# Patient Record
Sex: Male | Born: 1970 | Race: Black or African American | Hispanic: No | Marital: Single | Smoking: Never smoker
Health system: Southern US, Community
[De-identification: ages and names within clinical notes are randomized; demographics above are authoritative.]

## PROBLEM LIST (undated history)

## (undated) DIAGNOSIS — R0600 Dyspnea, unspecified: Secondary | ICD-10-CM

## (undated) DIAGNOSIS — E119 Type 2 diabetes mellitus without complications: Secondary | ICD-10-CM

## (undated) DIAGNOSIS — E785 Hyperlipidemia, unspecified: Secondary | ICD-10-CM

## (undated) DIAGNOSIS — B958 Unspecified staphylococcus as the cause of diseases classified elsewhere: Secondary | ICD-10-CM

## (undated) DIAGNOSIS — E669 Obesity, unspecified: Secondary | ICD-10-CM

## (undated) DIAGNOSIS — M109 Gout, unspecified: Secondary | ICD-10-CM

## (undated) DIAGNOSIS — I209 Angina pectoris, unspecified: Secondary | ICD-10-CM

## (undated) DIAGNOSIS — D509 Iron deficiency anemia, unspecified: Secondary | ICD-10-CM

## (undated) DIAGNOSIS — I10 Essential (primary) hypertension: Secondary | ICD-10-CM

## (undated) DIAGNOSIS — J189 Pneumonia, unspecified organism: Secondary | ICD-10-CM

## (undated) DIAGNOSIS — K921 Melena: Secondary | ICD-10-CM

## (undated) DIAGNOSIS — R011 Cardiac murmur, unspecified: Secondary | ICD-10-CM

## (undated) HISTORY — DX: Essential (primary) hypertension: I10

## (undated) HISTORY — PX: EYE SURGERY: SHX253

## (undated) HISTORY — DX: Melena: K92.1

## (undated) HISTORY — DX: Dyspnea, unspecified: R06.00

## (undated) HISTORY — DX: Hyperlipidemia, unspecified: E78.5

## (undated) HISTORY — DX: Gout, unspecified: M10.9

## (undated) HISTORY — DX: Iron deficiency anemia, unspecified: D50.9

---
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
## 1998-02-14 ENCOUNTER — Encounter: Payer: Self-pay | Admitting: Emergency Medicine

## 1998-02-14 ENCOUNTER — Emergency Department (HOSPITAL_COMMUNITY): Admission: EM | Admit: 1998-02-14 | Discharge: 1998-02-14 | Payer: Self-pay | Admitting: Emergency Medicine

## 1999-06-02 ENCOUNTER — Encounter: Payer: Self-pay | Admitting: Emergency Medicine

## 1999-06-02 ENCOUNTER — Emergency Department (HOSPITAL_COMMUNITY): Admission: EM | Admit: 1999-06-02 | Discharge: 1999-06-02 | Payer: Self-pay | Admitting: Emergency Medicine

## 2003-02-06 ENCOUNTER — Emergency Department (HOSPITAL_COMMUNITY): Admission: EM | Admit: 2003-02-06 | Discharge: 2003-02-06 | Payer: Self-pay | Admitting: Emergency Medicine

## 2009-02-01 ENCOUNTER — Encounter (INDEPENDENT_AMBULATORY_CARE_PROVIDER_SITE_OTHER): Payer: Self-pay | Admitting: *Deleted

## 2009-02-17 ENCOUNTER — Ambulatory Visit: Payer: Self-pay | Admitting: Family Medicine

## 2009-02-17 DIAGNOSIS — I10 Essential (primary) hypertension: Secondary | ICD-10-CM | POA: Insufficient documentation

## 2009-02-17 DIAGNOSIS — N529 Male erectile dysfunction, unspecified: Secondary | ICD-10-CM | POA: Insufficient documentation

## 2009-02-17 DIAGNOSIS — L0390 Cellulitis, unspecified: Secondary | ICD-10-CM

## 2009-02-17 DIAGNOSIS — L0291 Cutaneous abscess, unspecified: Secondary | ICD-10-CM | POA: Insufficient documentation

## 2009-04-21 ENCOUNTER — Ambulatory Visit: Payer: Self-pay | Admitting: Family Medicine

## 2009-04-24 LAB — CONVERTED CEMR LAB
ALT: 17 units/L (ref 0–53)
AST: 13 units/L (ref 0–37)
Albumin: 3.5 g/dL (ref 3.5–5.2)
Alkaline Phosphatase: 59 units/L (ref 39–117)
BUN: 8 mg/dL (ref 6–23)
Basophils Absolute: 0 10*3/uL (ref 0.0–0.1)
Basophils Relative: 0.3 % (ref 0.0–3.0)
Bilirubin, Direct: 0 mg/dL (ref 0.0–0.3)
CO2: 30 meq/L (ref 19–32)
Calcium: 9 mg/dL (ref 8.4–10.5)
Chloride: 104 meq/L (ref 96–112)
Cholesterol: 194 mg/dL (ref 0–200)
Creatinine, Ser: 1.1 mg/dL (ref 0.4–1.5)
Eosinophils Absolute: 0.1 10*3/uL (ref 0.0–0.7)
Eosinophils Relative: 0.9 % (ref 0.0–5.0)
GFR calc non Af Amer: 95.61 mL/min (ref >60)
Glucose, Bld: 95 mg/dL (ref 70–99)
HCT: 42.5 % (ref 39.0–52.0)
HDL: 38.9 mg/dL — ABNORMAL LOW (ref >39.00)
Hemoglobin: 13.2 g/dL (ref 13.0–17.0)
Hgb A1c MFr Bld: 6.1 % (ref 4.6–6.5)
LDL Cholesterol: 116 mg/dL — ABNORMAL HIGH (ref 0–99)
Lymphocytes Relative: 13.2 % (ref 12.0–46.0)
Lymphs Abs: 1.6 10*3/uL (ref 0.7–4.0)
MCHC: 31.1 g/dL (ref 30.0–36.0)
MCV: 79.5 fL (ref 78.0–100.0)
Monocytes Absolute: 0.5 10*3/uL (ref 0.1–1.0)
Monocytes Relative: 4.1 % (ref 3.0–12.0)
Neutro Abs: 9.6 10*3/uL — ABNORMAL HIGH (ref 1.4–7.7)
Neutrophils Relative %: 81.5 % — ABNORMAL HIGH (ref 43.0–77.0)
PSA: 0.83 ng/mL (ref 0.10–4.00)
Platelets: 278 10*3/uL (ref 150.0–400.0)
Potassium: 3.8 meq/L (ref 3.5–5.1)
RBC: 5.35 M/uL (ref 4.22–5.81)
RDW: 15.2 % — ABNORMAL HIGH (ref 11.5–14.6)
Sex Hormone Binding: 16 nmol/L (ref 13–71)
Sodium: 139 meq/L (ref 135–145)
TSH: 3.05 microintl units/mL (ref 0.35–5.50)
Testosterone Free: 53.5 pg/mL (ref 47.0–244.0)
Testosterone-% Free: 2.7 % (ref 1.6–2.9)
Testosterone: 197.61 ng/dL — ABNORMAL LOW (ref 350–890)
Total Bilirubin: 0.4 mg/dL (ref 0.3–1.2)
Total CHOL/HDL Ratio: 5
Total Protein: 8.2 g/dL (ref 6.0–8.3)
Triglycerides: 197 mg/dL — ABNORMAL HIGH (ref 0.0–149.0)
VLDL: 39.4 mg/dL (ref 0.0–40.0)
WBC: 11.8 10*3/uL — ABNORMAL HIGH (ref 4.5–10.5)

## 2009-05-12 ENCOUNTER — Ambulatory Visit: Payer: Self-pay | Admitting: Family Medicine

## 2010-04-05 NOTE — Assessment & Plan Note (Signed)
Summary: 3 week fu/ see labs,v70,0,401.9,cbcd,bmp,hep,lipid,tsh,psa,fr...   Vital Signs:  Patient profile:   40 year old male Weight:      337 pounds Temp:     98.3 degrees F oral Pulse rate:   86 / minute Pulse rhythm:   regular BP sitting:   130 / 86  (left arm) Cuff size:   large  Vitals Entered By: Army Fossa CMA (April 21, 2009 9:06 AM) CC: follow up on BP, labwork.    History of Present Illness:  Hypertension follow-up      This is a 40 year old man who presents for Hypertension follow-up.  Pt went 3 weeks without bp meds---he just got busy--- just filled it 2 days ago.  The patient denies lightheadedness, urinary frequency, headaches, edema, impotence, rash, and fatigue.  The patient denies the following associated symptoms: chest pain, chest pressure, exercise intolerance, dyspnea, palpitations, syncope, leg edema, and pedal edema.  Compliance with medications (by patient report) has been sporadic.  The patient reports that dietary compliance has been fair.  The patient reports exercising occasionally.  Adjunctive measures currently used by the patient include salt restriction.    Preventive Screening-Counseling & Management  Alcohol-Tobacco     Alcohol drinks/day: <1     Alcohol type: beer     >5/day in last 3 mos: no     Smoking Status: never  Caffeine-Diet-Exercise     Caffeine use/day: 2     Diet Comments: poor     Does Patient Exercise: no  Current Medications (verified): 1)  Lisinopril 10 Mg Tabs (Lisinopril) .Marland Kitchen.. 1 By Mouth Once Daily  Allergies (verified): No Known Drug Allergies  Past History:  Past medical, surgical, family and social histories (including risk factors) reviewed for relevance to current acute and chronic problems.  Past Medical History: Reviewed history from 02/17/2009 and no changes required. Hypertension Previous Heart Murmur  Past Surgical History: Reviewed history from 02/17/2009 and no changes required. liposuction  1992  Family History: Reviewed history from 02/17/2009 and no changes required. Family History Diabetes 1st degree relative Family History Hypertension Heart Disease  Social History: Reviewed history from 02/17/2009 and no changes required. Married Never Smoked Alcohol use-yes Drug use-no Occupation: truck Hospital doctor  Review of Systems      See HPI  Physical Exam  General:  Well-developed,well-nourished,in no acute distress; alert,appropriate and cooperative throughout examinationoverweight-appearing.   Lungs:  Normal respiratory effort, chest expands symmetrically. Lungs are clear to auscultation, no crackles or wheezes. Heart:  Normal rate and regular rhythm. S1 and S2 normal without gallop, murmur, click, rub or other extra sounds. Extremities:  No clubbing, cyanosis, edema, or deformity noted with normal full range of motion of all joints.   Psych:  Oriented X3 and flat affect.     Impression & Recommendations:  Problem # 1:  HYPERTENSION (ICD-401.9) D/W pt importance of taking medication--he understands His updated medication list for this problem includes:    Lisinopril 10 Mg Tabs (Lisinopril) .Marland Kitchen... 1 by mouth once daily  Orders: Venipuncture (09323) TLB-Lipid Panel (80061-LIPID) TLB-BMP (Basic Metabolic Panel-BMET) (80048-METABOL) TLB-CBC Platelet - w/Differential (85025-CBCD) TLB-Hepatic/Liver Function Pnl (80076-HEPATIC) TLB-TSH (Thyroid Stimulating Hormone) (84443-TSH) TLB-A1C / Hgb A1C (Glycohemoglobin) (83036-A1C)  BP today: 130/86 Prior BP: 140/90 (02/17/2009)  Problem # 2:  ERECTILE DYSFUNCTION, ORGANIC (ICD-607.84)  The following medications were removed from the medication list:    Viagra 100 Mg Tabs (Sildenafil citrate) .Marland Kitchen... As directed  Orders: TLB-PSA (Prostate Specific Antigen) (84153-PSA) T- * Misc. Laboratory test (  16109)  Discussed proper use of medications, as well as side effects.   Complete Medication List: 1)  Lisinopril 10 Mg Tabs  (Lisinopril) .Marland Kitchen.. 1 by mouth once daily  Patient Instructions: 1)  Please schedule a follow-up appointment in 2 weeks.

## 2010-04-05 NOTE — Assessment & Plan Note (Signed)
Summary: 3 week fu/kdc   Vital Signs:  Patient profile:   40 year old male Weight:      335 pounds Temp:     97.9 degrees F oral Pulse rate:   82 / minute BP sitting:   130 / 86  (left arm)  Vitals Entered By: Jeremy Johann CMA (May 12, 2009 9:47 AM) CC: 3 weeks f/u  BP Comments REVIEWED MED LIST, PATIENT AGREED DOSE AND INSTRUCTION CORRECT    History of Present Illness:  Hypertension follow-up      This is a 40 year old man who presents for Hypertension follow-up.  Pt missed 2 doses of bp meds in the last 3 weeks.  The patient denies lightheadedness, urinary frequency, headaches, edema, impotence, rash, and fatigue.  The patient denies the following associated symptoms: chest pain, chest pressure, exercise intolerance, dyspnea, palpitations, syncope, leg edema, and pedal edema.  Compliance with medications (by patient report) has been near 100%.  The patient reports that dietary compliance has been good.  Adjunctive measures currently used by the patient include salt restriction.    Allergies (verified): No Known Drug Allergies  Physical Exam  General:  Well-developed,well-nourished,in no acute distress; alert,appropriate and cooperative throughout examination Lungs:  Normal respiratory effort, chest expands symmetrically. Lungs are clear to auscultation, no crackles or wheezes. Heart:  normal rate and no murmur.   Extremities:  No clubbing, cyanosis, edema, or deformity noted with normal full range of motion of all joints.     Impression & Recommendations:  Problem # 1:  HYPERTENSION (ICD-401.9)  His updated medication list for this problem includes:    Lisinopril 20 Mg Tabs (Lisinopril) .Marland Kitchen... 1 by mouth once daily  BP today: 130/86 Prior BP: 130/86 (04/21/2009)  Labs Reviewed: K+: 3.8 (04/21/2009) Creat: : 1.1 (04/21/2009)   Chol: 194 (04/21/2009)   HDL: 38.90 (04/21/2009)   LDL: 116 (04/21/2009)   TG: 197.0 (04/21/2009)  Complete Medication List: 1)  Lisinopril  20 Mg Tabs (Lisinopril) .Marland Kitchen.. 1 by mouth once daily  Patient Instructions: 1)  rto 3-4 weeks Prescriptions: LISINOPRIL 20 MG TABS (LISINOPRIL) 1 by mouth once daily  #30 x 2   Entered and Authorized by:   Loreen Freud DO   Signed by:   Loreen Freud DO on 05/12/2009   Method used:   Electronically to        [facility]  [address] [id number] (ret[facility])       [address]       [place], [place]  [address]       Ph: [phone] or [phone]       Fax: [phone]   RxID:   [id number]

## 2010-09-07 ENCOUNTER — Ambulatory Visit (INDEPENDENT_AMBULATORY_CARE_PROVIDER_SITE_OTHER): Payer: 59 | Admitting: Internal Medicine

## 2010-09-07 ENCOUNTER — Encounter: Payer: Self-pay | Admitting: Internal Medicine

## 2010-09-07 VITALS — BP 142/98 | HR 107 | Temp 97.9°F | Wt 356.0 lb

## 2010-09-07 DIAGNOSIS — K219 Gastro-esophageal reflux disease without esophagitis: Secondary | ICD-10-CM

## 2010-09-07 DIAGNOSIS — I10 Essential (primary) hypertension: Secondary | ICD-10-CM

## 2010-09-07 MED ORDER — LOSARTAN POTASSIUM 100 MG PO TABS: 100.0000 mg | ORAL_TABLET | Freq: Every day | ORAL | Status: DC

## 2010-09-07 MED ORDER — RANITIDINE HCL 150 MG PO TABS: 150.0000 mg | ORAL_TABLET | Freq: Two times a day (BID) | ORAL | Status: DC

## 2010-09-07 NOTE — Patient Instructions (Addendum)
The triggers for reflux  include stress; the "aspirin family" ; alcohol; peppermint; and caffeine (coffee, tea, cola, and chocolate). The aspirin family would include aspirin and the nonsteroidal agents such as ibuprofen &  Naproxen. Tylenol would not cause reflux. If having dyspepsia ; food & dink should be avoided for @ least 2 hours before going to bed.  Stop the lisinopril and monitor the cough on Losartan 100mg  daily. Blood Pressure Goal  Ideally is an AVERAGE < 135/85. This AVERAGE should be calculated from @ least 5-7 BP readings taken @ different times of day on different days of week. You should not respond to isolated BP readings , but rather the AVERAGE for that week . Preventive Health Care: Exercise at least 30-45 minutes a day,  3-4 days a week.  Eat a low-fat diet with lots of fruits and vegetables, up to 7-9 servings per day. Avoid obesity; your goal is waist measurement < 40 inches.Consume less than 40 grams of sugar per day from foods & drinks with High Fructose Corn Sugar as #1,2,3 or # 4 on label. To calculate teaspoons of sugar in foods or drinks divided the grams of sugar by 4. For example a 22 ounce Sprite  will have 68 g of sugar. This would be 17 teaspoons of sugar that the body can not break down. High Fructose corn syrup sugar is sent to the liver and turned a triglycerides or liquid fat which is stored inside the belly. A good reference for low glycemic carbs (non-white carbs urgencies would be the new sugar busters program.

## 2010-09-07 NOTE — Progress Notes (Signed)
  Subjective:    Patient ID: Parker West, male    DOB: [birth year], 40 y.o.   MRN: [id number]  HPI Cough Onset:3 months Extrinsic symptoms:itchy eyes, sneezing:no  Infectious symptoms :fever, purulent secretions :no Chest symptoms: pain, sputum production, hemoptysis,dyspnea,wheezing:no GI symptoms: Dyspepsia, reflux: yes Occupational/environmental exposures: truck driver  Smoking:never ACE inhibitor:Lisinopril 20 mg daily Treatment/efficacy: no treatment to date Past medical history/family history pulmonary disease: none    Review of Systems intermittent snoring but no apnea as per wife     Objective:   Physical Exam General appearance: no acute distress or increased work of breathing is present.  No  lymphadenopathy about the head, neck, or axilla noted.   Eyes: No conjunctival inflammation or lid edema is present. There is no scleral icterus.  Ears:  External ear exam shows no significant lesions or deformities.  Otoscopic examination reveals clear canals, tympanic membranes are intact bilaterally without bulging, retraction, inflammation or discharge.  Nose:  External nasal examination shows no deformity or inflammation. Nasal mucosa are pink and moist without lesions or exudates. No septal dislocation or dislocation.No obstruction to airflow.   Oral exam: Dental hygiene is good; lips and gums are healthy appearing.There is no oropharyngeal erythema or exudate noted but oropharynx crowded  Neck:  No deformities, thyromegaly, masses, or tenderness noted.   Supple with full range of motion without pain.   Heart:  Normal rate and regular rhythm. S1 and S2 normal without  murmur, click, rub or other extra sounds.  S4  Lungs:Chest clear to auscultation; no wheezes, rhonchi,rales ,or rubs present.No increased work of breathing.    Extremities:  No cyanosis, edema, or clubbing  noted    Skin: Warm & dry w/o jaundice or tenting.          Assessment & Plan:  #1 cough,  chronic (3 months)  #2 ACE inhibitor therapy for hypertension  #3 esophageal reflux   Plan: See orders.

## 2011-05-25 ENCOUNTER — Ambulatory Visit (INDEPENDENT_AMBULATORY_CARE_PROVIDER_SITE_OTHER): Payer: 59 | Admitting: Family Medicine

## 2011-05-25 ENCOUNTER — Ambulatory Visit: Payer: 59

## 2011-05-25 VITALS — BP 131/80 | HR 85 | Temp 98.2°F | Resp 16 | Ht 72.0 in | Wt 338.0 lb

## 2011-05-25 DIAGNOSIS — K591 Functional diarrhea: Secondary | ICD-10-CM

## 2011-05-25 DIAGNOSIS — K5289 Other specified noninfective gastroenteritis and colitis: Secondary | ICD-10-CM

## 2011-05-25 DIAGNOSIS — K625 Hemorrhage of anus and rectum: Secondary | ICD-10-CM

## 2011-05-25 DIAGNOSIS — K529 Noninfective gastroenteritis and colitis, unspecified: Secondary | ICD-10-CM

## 2011-05-25 DIAGNOSIS — R197 Diarrhea, unspecified: Secondary | ICD-10-CM

## 2011-05-25 DIAGNOSIS — R109 Unspecified abdominal pain: Secondary | ICD-10-CM

## 2011-05-25 LAB — POCT URINALYSIS DIPSTICK
Bilirubin, UA: NEGATIVE
Blood, UA: NEGATIVE
Glucose, UA: NEGATIVE
Ketones, UA: NEGATIVE
Leukocytes, UA: NEGATIVE
Nitrite, UA: NEGATIVE
Protein, UA: 100
Spec Grav, UA: 1.015
Urobilinogen, UA: 0.2
pH, UA: 7

## 2011-05-25 LAB — POCT CBC
Granulocyte percent: 76.7 %G (ref 37–80)
HCT, POC: 40.3 % — AB (ref 43.5–53.7)
Hemoglobin: 13 g/dL — AB (ref 14.1–18.1)
Lymph, poc: 2.6 (ref 0.6–3.4)
MCH, POC: 23.9 pg — AB (ref 27–31.2)
MCHC: 32.3 g/dL (ref 31.8–35.4)
MCV: 74.3 fL — AB (ref 80–97)
MID (cbc): 0.9 (ref 0–0.9)
MPV: 8.6 fL (ref 0–99.8)
POC Granulocyte: 11.6 — AB (ref 2–6.9)
POC LYMPH PERCENT: 17.3 % (ref 10–50)
POC MID %: 6 %M (ref 0–12)
Platelet Count, POC: 410 10*3/uL (ref 142–424)
RBC: 5.43 M/uL (ref 4.69–6.13)
RDW, POC: 17.6 %
WBC: 15.1 10*3/uL — AB (ref 4.6–10.2)

## 2011-05-25 LAB — POCT UA - MICROSCOPIC ONLY
Casts, Ur, LPF, POC: NEGATIVE
Crystals, Ur, HPF, POC: NEGATIVE
Mucus, UA: NEGATIVE
Yeast, UA: NEGATIVE

## 2011-05-25 LAB — IFOBT (OCCULT BLOOD): IFOBT: NEGATIVE

## 2011-05-25 MED ORDER — CIPROFLOXACIN HCL 500 MG PO TABS: 500.0000 mg | ORAL_TABLET | Freq: Two times a day (BID) | ORAL | Status: AC

## 2011-05-25 NOTE — Progress Notes (Signed)
[facility]:  Office Visit  Chief Complaint:  Chief Complaint  Patient presents with  . Rectal Bleeding    a small amount of bright red blood in stool x 1 week  . Abdominal Pain    right sided discomfort x 1 week    HPI: ULYSEE FYOCK is a 41 y.o. male who complains of   1. 1 week history of right sided flank discomfort. Achy pain when he moves only, not at rest, not associated with food. No fevers, chills, dysuria. Tried Aspirin with some relief. No history of kidney stones.   2. 3 epsiode in last week had blood in stool, bright red bloody streaks in brown stool. Denies constipation or hemorrhoids. No fevers, chills, N/V/abd pain, unintentional weight loss. Denies h/o gastric ulcers, alcohol abuse, IBD/IBS, colon cancer. No prior history. Patient ran out of BP medicine and so took one week of ASA 325 mg daily and then noticed having blood in stool. Last epsiode of BRBPR was this mornig.   3. Diarrhea, nonbloody. Exposurre to GI bug family.   Past Medical History  Diagnosis Date  . Hypertension   . Hyperlipidemia    History reviewed. No pertinent past surgical history. History   Social History  . Marital Status: Single    Spouse Name: N/A    Number of Children: N/A  . Years of Education: N/A   Social History Main Topics  . Smoking status: Never Smoker   . Smokeless tobacco: None  . Alcohol Use: Yes     Social  . Drug Use: No  . Sexually Active: None   Other Topics Concern  . None   Social History Narrative  . None   Family History  Problem Relation Age of Onset  . Stroke Mother   . Hypertension Mother   . Diabetes Mother   . Hypertension Father    No Known Allergies Prior to Admission medications   Medication Sig Start Date End Date Taking? Authorizing Provider  losartan (COZAAR) 100 MG tablet Take 1 tablet (100 mg total) by mouth daily. In place of Lisinopril 09/07/10 09/07/11 Yes Pecola Lawless, MD  ranitidine (ZANTAC) 150 MG tablet  Take 1 tablet (150 mg total) by mouth 2 (two) times daily. 09/07/10 09/07/11  Pecola Lawless, MD     ROS: The patient denies fevers, chills, night sweats, unintentional weight loss, chest pain, palpitations, wheezing, dyspnea on exertion, nausea, vomiting, abdominal pain, dysuria, hematuria, numbness, weakness, or tingling. + BRBPR  All other systems have been reviewed and were otherwise negative with the exception of those mentioned in the HPI and as above.    PHYSICAL EXAM: Filed Vitals:   05/25/11 1358  BP: 131/80  Pulse: 85  Temp: 98.2 F (36.8 C)  Resp: 16   Filed Vitals:   05/25/11 1358  Height: 6' (1.829 m)  Weight: 338 lb (153.316 kg)   Body mass index is 45.84 kg/(m^2).  General: Alert, no acute distress, morbidly obese, morbidly obese HEENT:  Normocephalic, atraumatic, oropharynx patent.  Cardiovascular:  Regular rate and rhythm, no rubs murmurs or gallops.  No Carotid bruits, radial pulse intact. No pedal edema.  Respiratory: Clear to auscultation bilaterally.  No wheezes, rales, or rhonchi.  No cyanosis, no use of accessory musculature GI: No organomegaly, abdomen is soft and non-tender, positive bowel sounds.  No masses. No guarding or rebound Skin: No rashes. Neurologic: Facial musculature symmetric. Psychiatric: Patient is appropriate throughout our interaction. Lymphatic: No cervical lymphadenopathy  Musculoskeletal: Gait intact. Rectal exam-nl, no hemorrhoids, no fissures, normal prostate. hemosure negative  LABS: Results for orders placed in visit on 05/25/11  POCT CBC      Component Value Range   WBC 15.1 (*) 4.6 - 10.2 (K/uL)   Lymph, poc 2.6  0.6 - 3.4    POC LYMPH PERCENT 17.3  10 - 50 (%L)   MID (cbc) 0.9  0 - 0.9    POC MID % 6.0  0 - 12 (%M)   POC Granulocyte 11.6 (*) 2 - 6.9    Granulocyte percent 76.7  37 - 80 (%G)   RBC 5.43  4.69 - 6.13 (M/uL)   Hemoglobin 13.0 (*) 14.1 - 18.1 (g/dL)   HCT, POC 40.9 (*) 81.1 - 53.7 (%)   MCV 74.3 (*) 80 - 97  (fL)   MCH, POC 23.9 (*) 27 - 31.2 (pg)   MCHC 32.3  31.8 - 35.4 (g/dL)   RDW, POC 91.4     Platelet Count, POC 410  142 - 424 (K/uL)   MPV 8.6  0 - 99.8 (fL)  IFOBT (OCCULT BLOOD)      Component Value Range   IFOBT Negative    POCT URINALYSIS DIPSTICK      Component Value Range   Color, UA yellow     Clarity, UA clear     Glucose, UA neg     Bilirubin, UA neg     Ketones, UA neg     Spec Grav, UA 1.015     Blood, UA neg     pH, UA 7.0     Protein, UA 100     Urobilinogen, UA 0.2     Nitrite, UA neg     Leukocytes, UA Negative    POCT UA - MICROSCOPIC ONLY      Component Value Range   WBC, Ur, HPF, POC 0-1     RBC, urine, microscopic 0-1     Bacteria, U Microscopic trace     Mucus, UA neg     Epithelial cells, urine per micros 0-2     Crystals, Ur, HPF, POC neg     Casts, Ur, LPF, POC neg     Yeast, UA neg       EKG/XRAY:   Primary read interpreted by Dr. Conley Rolls at [facility]. No free air, + nonspecific gas patterns   ASSESSMENT/PLAN: Encounter Diagnoses  Name Primary?  . Bright red blood per rectum Yes  . Flank pain   . Diarrhea   . Gastroenteritis    Rx Cipro for ? Gastroenteritis infx x 1 week. WBC was 15. ASked pt to return in 14 days to get repeat CBC and reevalute sxs Push fluids, BRAT diet Gave pt hemoccult stool x 3 to return.  F/u prn otherwise 14 days.     Hamilton Capri PHUONG, DO 05/25/2011 4:42 PM

## 2011-07-14 ENCOUNTER — Ambulatory Visit: Payer: Self-pay | Admitting: Family Medicine

## 2011-07-14 ENCOUNTER — Encounter: Payer: Self-pay | Admitting: Family Medicine

## 2011-07-14 VITALS — BP 154/105 | HR 83 | Temp 98.0°F | Resp 16 | Ht 70.0 in | Wt 331.0 lb

## 2011-07-14 DIAGNOSIS — I10 Essential (primary) hypertension: Secondary | ICD-10-CM

## 2011-07-14 NOTE — Progress Notes (Signed)
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 He has a history of hypertension but is not taking any medicines for the last 2 months.  Objective: Blood pressure recheck shows blood pressure of 120/86, patient is morbidly obese but in no acute distress  HEENT unremarkable  Rest of exam is filled out on the form  I had a long discussion with patient is high blood pressure, and obesity.  Assessment: Patient not eating appropriately and is therefore obese. He needs to make significant changes in his diet such as cutting out sweets sodas, sweet tea, and fast foods. I explained this is poisoned for him. I think he got the message  Plan: About a two-year certificate since patient's blood pressure did come down nicely with relaxation. It took the patient to make changes in his diet so that he does not progress to needing high blood pressure medicines and diabetes.

## 2011-08-27 ENCOUNTER — Other Ambulatory Visit: Payer: Self-pay | Admitting: Internal Medicine

## 2011-08-28 NOTE — Telephone Encounter (Signed)
Pt was last seen in July 2012 and rx was provided for losartan. Pt has no future appts on file with [place]. Please advise if ok to provide refills and if so how many may we give?

## 2011-08-28 NOTE — Telephone Encounter (Signed)
Last seen 7/12.Is he now seeing Dr Janace Hoard @[facility] ? He needs F/U before refill

## 2011-08-29 NOTE — Telephone Encounter (Signed)
Patient needs to schedule a CPX  

## 2011-09-01 ENCOUNTER — Other Ambulatory Visit: Payer: Self-pay | Admitting: Internal Medicine

## 2011-09-02 NOTE — Telephone Encounter (Signed)
Patient needs to schedule a CPX  

## 2011-09-04 ENCOUNTER — Inpatient Hospital Stay (HOSPITAL_COMMUNITY)
Admission: EM | Admit: 2011-09-04 | Discharge: 2011-09-10 | DRG: 546 | Disposition: A | Discharge status: Home / Self Care | Payer: 59 | Attending: Internal Medicine | Admitting: Internal Medicine

## 2011-09-04 ENCOUNTER — Encounter (HOSPITAL_COMMUNITY): Payer: Self-pay | Admitting: *Deleted

## 2011-09-04 DIAGNOSIS — R21 Rash and other nonspecific skin eruption: Secondary | ICD-10-CM

## 2011-09-04 DIAGNOSIS — I10 Essential (primary) hypertension: Secondary | ICD-10-CM | POA: Diagnosis present

## 2011-09-04 DIAGNOSIS — M310 Hypersensitivity angiitis: Principal | ICD-10-CM | POA: Diagnosis present

## 2011-09-04 DIAGNOSIS — Z6841 Body Mass Index (BMI) 40.0 and over, adult: Secondary | ICD-10-CM

## 2011-09-04 DIAGNOSIS — L0291 Cutaneous abscess, unspecified: Secondary | ICD-10-CM

## 2011-09-04 DIAGNOSIS — N529 Male erectile dysfunction, unspecified: Secondary | ICD-10-CM

## 2011-09-04 DIAGNOSIS — L989 Disorder of the skin and subcutaneous tissue, unspecified: Secondary | ICD-10-CM

## 2011-09-04 DIAGNOSIS — L03119 Cellulitis of unspecified part of limb: Secondary | ICD-10-CM | POA: Diagnosis present

## 2011-09-04 DIAGNOSIS — B958 Unspecified staphylococcus as the cause of diseases classified elsewhere: Secondary | ICD-10-CM

## 2011-09-04 DIAGNOSIS — L02419 Cutaneous abscess of limb, unspecified: Secondary | ICD-10-CM | POA: Diagnosis present

## 2011-09-04 DIAGNOSIS — I824Y9 Acute embolism and thrombosis of unspecified deep veins of unspecified proximal lower extremity: Secondary | ICD-10-CM | POA: Diagnosis present

## 2011-09-04 DIAGNOSIS — I82409 Acute embolism and thrombosis of unspecified deep veins of unspecified lower extremity: Secondary | ICD-10-CM

## 2011-09-04 DIAGNOSIS — Z79899 Other long term (current) drug therapy: Secondary | ICD-10-CM

## 2011-09-04 DIAGNOSIS — E785 Hyperlipidemia, unspecified: Secondary | ICD-10-CM | POA: Diagnosis present

## 2011-09-04 DIAGNOSIS — D6859 Other primary thrombophilia: Secondary | ICD-10-CM | POA: Diagnosis present

## 2011-09-04 HISTORY — DX: Cardiac murmur, unspecified: R01.1

## 2011-09-04 HISTORY — DX: Angina pectoris, unspecified: I20.9

## 2011-09-04 HISTORY — DX: Unspecified staphylococcus as the cause of diseases classified elsewhere: B95.8

## 2011-09-04 HISTORY — DX: Obesity, unspecified: E66.9

## 2011-09-04 HISTORY — DX: Pneumonia, unspecified organism: J18.9

## 2011-09-04 LAB — COMPREHENSIVE METABOLIC PANEL
CO2: 28 mEq/L (ref 19–32)
Calcium: 9 mg/dL (ref 8.4–10.5)
Creatinine, Ser: 1.24 mg/dL (ref 0.50–1.35)
GFR calc Af Amer: 82 mL/min — ABNORMAL LOW (ref >90)
GFR calc non Af Amer: 71 mL/min — ABNORMAL LOW (ref >90)
Glucose, Bld: 98 mg/dL (ref 70–99)
Total Protein: 8 g/dL (ref 6.0–8.3)

## 2011-09-04 LAB — CBC WITH DIFFERENTIAL/PLATELET
Basophils Absolute: 0 10*3/uL (ref 0.0–0.1)
Basophils Relative: 0 % (ref 0–1)
Eosinophils Absolute: 0.2 10*3/uL (ref 0.0–0.7)
Hemoglobin: 13.3 g/dL (ref 13.0–17.0)
Lymphocytes Relative: 13 % (ref 12–46)
MCH: 24.4 pg — ABNORMAL LOW (ref 26.0–34.0)
MCHC: 34.1 g/dL (ref 30.0–36.0)
Monocytes Absolute: 1.5 10*3/uL — ABNORMAL HIGH (ref 0.1–1.0)
Neutro Abs: 12.8 10*3/uL — ABNORMAL HIGH (ref 1.7–7.7)
Neutrophils Relative %: 77 % (ref 43–77)
RDW: 16.2 % — ABNORMAL HIGH (ref 11.5–15.5)

## 2011-09-04 NOTE — ED Notes (Signed)
Pt has bilateral lower extremity swelling, pain and redness.  Legs area also warm to the touch and pt has red, raised areas and bumps on both legs.  Pt reports that he has had chills.  These symptoms began 2 days ago and have been increasing

## 2011-09-05 ENCOUNTER — Encounter (HOSPITAL_COMMUNITY): Payer: Self-pay | Admitting: General Practice

## 2011-09-05 DIAGNOSIS — I82409 Acute embolism and thrombosis of unspecified deep veins of unspecified lower extremity: Secondary | ICD-10-CM

## 2011-09-05 DIAGNOSIS — L0291 Cutaneous abscess, unspecified: Secondary | ICD-10-CM

## 2011-09-05 DIAGNOSIS — L989 Disorder of the skin and subcutaneous tissue, unspecified: Secondary | ICD-10-CM | POA: Diagnosis present

## 2011-09-05 DIAGNOSIS — L0390 Cellulitis, unspecified: Secondary | ICD-10-CM

## 2011-09-05 DIAGNOSIS — L03119 Cellulitis of unspecified part of limb: Secondary | ICD-10-CM

## 2011-09-05 DIAGNOSIS — I10 Essential (primary) hypertension: Secondary | ICD-10-CM

## 2011-09-05 LAB — URINALYSIS, ROUTINE W REFLEX MICROSCOPIC
Leukocytes, UA: NEGATIVE
Nitrite: NEGATIVE
Specific Gravity, Urine: 1.016 (ref 1.005–1.030)
Urobilinogen, UA: 1 mg/dL (ref 0.0–1.0)
pH: 6 (ref 5.0–8.0)

## 2011-09-05 LAB — URINE MICROSCOPIC-ADD ON

## 2011-09-05 LAB — PROTIME-INR: Prothrombin Time: 14.6 seconds (ref 11.6–15.2)

## 2011-09-05 LAB — RAPID URINE DRUG SCREEN, HOSP PERFORMED: Amphetamines: NOT DETECTED

## 2011-09-05 LAB — HEPARIN LEVEL (UNFRACTIONATED): Heparin Unfractionated: 0.35 IU/mL (ref 0.30–0.70)

## 2011-09-05 MED ORDER — ENOXAPARIN SODIUM 40 MG/0.4ML ~~LOC~~ SOLN
40.0000 mg | SUBCUTANEOUS | Status: DC
  Administered 2011-09-05: 40 mg via SUBCUTANEOUS
  Filled 2011-09-05: qty 0.4

## 2011-09-05 MED ORDER — OXYCODONE-ACETAMINOPHEN 5-325 MG PO TABS
1.0000 | ORAL_TABLET | Freq: Once | ORAL | Status: AC
  Administered 2011-09-05: 1 via ORAL
  Filled 2011-09-05: qty 1

## 2011-09-05 MED ORDER — SODIUM CHLORIDE 0.9 % IV SOLN: INTRAVENOUS | Status: AC

## 2011-09-05 MED ORDER — WARFARIN VIDEO
Freq: Once | Status: AC
  Administered 2011-09-05: 16:00:00

## 2011-09-05 MED ORDER — LOSARTAN POTASSIUM 50 MG PO TABS
100.0000 mg | ORAL_TABLET | Freq: Every day | ORAL | Status: DC
  Administered 2011-09-05 – 2011-09-10 (×6): 100 mg via ORAL
  Filled 2011-09-05 (×6): qty 2

## 2011-09-05 MED ORDER — COUMADIN BOOK
Freq: Once | Status: AC
  Administered 2011-09-05: 16:00:00
  Filled 2011-09-05: qty 1

## 2011-09-05 MED ORDER — ACETAMINOPHEN 325 MG PO TABS
650.0000 mg | ORAL_TABLET | Freq: Once | ORAL | Status: AC
  Administered 2011-09-05: 650 mg via ORAL
  Filled 2011-09-05: qty 2

## 2011-09-05 MED ORDER — CEFAZOLIN SODIUM 1 G IJ SOLR
1.0000 g | Freq: Once | INTRAMUSCULAR | Status: DC
  Filled 2011-09-05: qty 10

## 2011-09-05 MED ORDER — HYDROXYZINE HCL 25 MG PO TABS
25.0000 mg | ORAL_TABLET | Freq: Once | ORAL | Status: AC
  Administered 2011-09-05: 25 mg via ORAL
  Filled 2011-09-05: qty 1

## 2011-09-05 MED ORDER — VANCOMYCIN HCL IN DEXTROSE 1-5 GM/200ML-% IV SOLN
1000.0000 mg | Freq: Once | INTRAVENOUS | Status: AC
  Administered 2011-09-05: 1000 mg via INTRAVENOUS
  Filled 2011-09-05: qty 200

## 2011-09-05 MED ORDER — SODIUM CHLORIDE 0.9 % IV BOLUS (SEPSIS)
1000.0000 mL | Freq: Once | INTRAVENOUS | Status: AC
  Administered 2011-09-05: 1000 mL via INTRAVENOUS

## 2011-09-05 MED ORDER — HEPARIN BOLUS VIA INFUSION
4000.0000 [IU] | Freq: Once | INTRAVENOUS | Status: AC
  Administered 2011-09-05: 4000 [IU] via INTRAVENOUS
  Filled 2011-09-05: qty 4000

## 2011-09-05 MED ORDER — ACETAMINOPHEN 650 MG RE SUPP: 650.0000 mg | Freq: Four times a day (QID) | RECTAL | Status: DC | PRN

## 2011-09-05 MED ORDER — ONDANSETRON HCL 4 MG PO TABS: 4.0000 mg | ORAL_TABLET | Freq: Four times a day (QID) | ORAL | Status: DC | PRN

## 2011-09-05 MED ORDER — VANCOMYCIN HCL 1000 MG IV SOLR
1500.0000 mg | Freq: Three times a day (TID) | INTRAVENOUS | Status: DC
  Administered 2011-09-05 – 2011-09-06 (×4): 1500 mg via INTRAVENOUS
  Filled 2011-09-05 (×7): qty 1500

## 2011-09-05 MED ORDER — ACETAMINOPHEN 325 MG PO TABS
650.0000 mg | ORAL_TABLET | Freq: Four times a day (QID) | ORAL | Status: DC | PRN
  Administered 2011-09-05: 650 mg via ORAL
  Filled 2011-09-05: qty 2

## 2011-09-05 MED ORDER — HYDROMORPHONE HCL PF 1 MG/ML IJ SOLN
0.5000 mg | INTRAMUSCULAR | Status: DC | PRN
  Administered 2011-09-05 – 2011-09-06 (×4): 0.5 mg via INTRAVENOUS
  Filled 2011-09-05 (×4): qty 1

## 2011-09-05 MED ORDER — ONDANSETRON HCL 4 MG/2ML IJ SOLN: 4.0000 mg | Freq: Four times a day (QID) | INTRAMUSCULAR | Status: DC | PRN

## 2011-09-05 MED ORDER — WARFARIN SODIUM 10 MG PO TABS
10.0000 mg | ORAL_TABLET | Freq: Once | ORAL | Status: AC
  Administered 2011-09-05: 10 mg via ORAL
  Filled 2011-09-05: qty 1

## 2011-09-05 MED ORDER — WARFARIN - PHARMACIST DOSING INPATIENT
Freq: Every day | Status: DC
  Administered 2011-09-06 – 2011-09-08 (×2)

## 2011-09-05 MED ORDER — METHYLPREDNISOLONE SODIUM SUCC 125 MG IJ SOLR
80.0000 mg | Freq: Three times a day (TID) | INTRAMUSCULAR | Status: DC
  Administered 2011-09-05 – 2011-09-08 (×8): 80 mg via INTRAVENOUS
  Filled 2011-09-05: qty 2
  Filled 2011-09-05 (×4): qty 1.28
  Filled 2011-09-05: qty 2
  Filled 2011-09-05: qty 1.28
  Filled 2011-09-05: qty 2
  Filled 2011-09-05 (×2): qty 1.28
  Filled 2011-09-05: qty 2
  Filled 2011-09-05 (×2): qty 1.28
  Filled 2011-09-05: qty 2

## 2011-09-05 MED ORDER — SODIUM CHLORIDE 0.9 % IV SOLN
INTRAVENOUS | Status: DC
  Administered 2011-09-05 (×2): via INTRAVENOUS

## 2011-09-05 MED ORDER — PIPERACILLIN-TAZOBACTAM 3.375 G IVPB
3.3750 g | Freq: Three times a day (TID) | INTRAVENOUS | Status: DC
  Administered 2011-09-05 – 2011-09-07 (×7): 3.375 g via INTRAVENOUS
  Filled 2011-09-05 (×11): qty 50

## 2011-09-05 MED ORDER — SODIUM CHLORIDE 0.9 % IJ SOLN
3.0000 mL | Freq: Two times a day (BID) | INTRAMUSCULAR | Status: DC
  Administered 2011-09-06 – 2011-09-10 (×7): 3 mL via INTRAVENOUS

## 2011-09-05 MED ORDER — HEPARIN (PORCINE) IN NACL 100-0.45 UNIT/ML-% IJ SOLN
1900.0000 [IU]/h | INTRAMUSCULAR | Status: DC
  Administered 2011-09-05 – 2011-09-06 (×2): 1900 [IU]/h via INTRAVENOUS
  Filled 2011-09-05 (×3): qty 250

## 2011-09-05 MED ORDER — DEXTROSE 5 % IV SOLN
1.0000 g | Freq: Once | INTRAVENOUS | Status: AC
  Administered 2011-09-05: 1 g via INTRAVENOUS
  Filled 2011-09-05: qty 10

## 2011-09-05 NOTE — ED Provider Notes (Signed)
Patient seen and examined with Ms Drue Novel.  Patient with diffuse lower extremity rash with macules and papule with some confluency.  Patient with fever and leukocytosis.  Plan iv antibiotics. History/physical exam/procedure(s) were performed by non-physician practitioner and as supervising physician I was immediately available for consultation/collaboration. I have reviewed all notes and am in agreement with care and plan.    Hilario Quarry, MD 09/05/11 [phone]

## 2011-09-05 NOTE — Progress Notes (Signed)
ANTICOAGULATION CONSULT NOTE - Follow Up Consult  Pharmacy Consult for heparin Indication: DVT  Labs:  Basename 09/05/11 2246 09/05/11 1559 09/04/11 2158  HGB -- -- 13.3  HCT -- -- 39.0  PLT -- -- 318  APTT -- -- --  LABPROT -- 14.6 --  INR -- 1.12 --  HEPARINUNFRC 0.35 -- --  CREATININE -- -- 1.24  CKTOTAL -- -- --  CKMB -- -- --  TROPONINI -- -- --    Assessment/Plan: 41yo male therapeutic on heparin with initial dosing for DVT.  Will continue gtt at current rate and confirm stable with am labs.  Colleen Can PharmD BCPS 09/05/2011,11:37 PM

## 2011-09-05 NOTE — Consult Note (Signed)
Reason for Consult:Rash Referring Physician:Oti  Parker West is an 41 y.o. male.  HPI:41 year old Black male with no prior skin disorder presented to ED last night with 3 day history of itchy then painful rash on legs.  He states at the outset, there was a tender lump at the upper right inner thigh, then the rash appeared.  He denies activities and exposures that would lead to contact dermatitis.  He was previously well.  He is on no new meds, but takes his blood pressure medicine, losartan, intermittently.  He restarted about ten days ago.  Denies fevers, chills, pain, headache, photophobia.  Denies over the counter or recreational drugs.  Denies work, hobbies or crafts that might be contributory.  Past Medical History  Diagnosis Date  . Hypertension   . Hyperlipidemia   . Obesity   . Heart murmur     "when I was small"  . Anginal pain   . Pneumonia 1988    "walking"  . Staph infection 09/04/11    BLE/wife's report    Past Surgical History  Procedure Date  . Reduction mammaplasty 1990's    Family History  Problem Relation Age of Onset  . Stroke Mother   . Hypertension Mother   . Diabetes Mother   . Hypertension Father     Social History:  reports that he has never smoked. He has never used smokeless tobacco. He reports that he drinks alcohol. He reports that he does not use illicit drugs.  Allergies: No Known Allergies  Medications: I have reviewed the patient's current medications.  Results for orders placed during the hospital encounter of 09/04/11 (from the past 48 hour(s))  CBC WITH DIFFERENTIAL     Status: Abnormal   Collection Time   09/04/11  9:58 PM      Component Value Range Comment   WBC 16.7 (*) 4.0 - 10.5 K/uL    RBC 5.44  4.22 - 5.81 MIL/uL    Hemoglobin 13.3  13.0 - 17.0 g/dL    HCT 16.1  09.6 - 04.5 %    MCV 71.7 (*) 78.0 - 100.0 fL    MCH 24.4 (*) 26.0 - 34.0 pg    MCHC 34.1  30.0 - 36.0 g/dL    RDW 40.9 (*) 81.1 - 15.5 %    Platelets 318  150 -  400 K/uL    Neutrophils Relative 77  43 - 77 %    Lymphocytes Relative 13  12 - 46 %    Monocytes Relative 9  3 - 12 %    Eosinophils Relative 1  0 - 5 %    Basophils Relative 0  0 - 1 %    Neutro Abs 12.8 (*) 1.7 - 7.7 K/uL    Lymphs Abs 2.2  0.7 - 4.0 K/uL    Monocytes Absolute 1.5 (*) 0.1 - 1.0 K/uL    Eosinophils Absolute 0.2  0.0 - 0.7 K/uL    Basophils Absolute 0.0  0.0 - 0.1 K/uL    RBC Morphology POLYCHROMASIA PRESENT      Smear Review LARGE PLATELETS PRESENT     COMPREHENSIVE METABOLIC PANEL     Status: Abnormal   Collection Time   09/04/11  9:58 PM      Component Value Range Comment   Sodium 142  135 - 145 mEq/L    Potassium 3.8  3.5 - 5.1 mEq/L    Chloride 101  96 - 112 mEq/L    CO2 28  19 - 32 mEq/L    Glucose, Bld 98  70 - 99 mg/dL    BUN 14  6 - 23 mg/dL    Creatinine, Ser 1.24  0.50 - 1.35 mg/dL    Calcium 9.0  8.4 - 10.5 mg/dL    Total Protein 8.0  6.0 - 8.3 g/dL    Albumin 3.1 (*) 3.5 - 5.2 g/dL    AST 14  0 - 37 U/L    ALT 11  0 - 53 U/L    Alkaline Phosphatase 81  39 - 117 U/L    Total Bilirubin 0.3  0.3 - 1.2 mg/dL    GFR calc non Af Amer 71 (*) >90 mL/min    GFR calc Af Amer 82 (*) >90 mL/min   URINALYSIS, ROUTINE W REFLEX MICROSCOPIC     Status: Abnormal   Collection Time   09/05/11 12:54 AM      Component Value Range Comment   Color, Urine YELLOW  YELLOW    APPearance CLEAR  CLEAR    Specific Gravity, Urine 1.016  1.005 - 1.030    pH 6.0  5.0 - 8.0    Glucose, UA NEGATIVE  NEGATIVE mg/dL    Hgb urine dipstick MODERATE (*) NEGATIVE    Bilirubin Urine NEGATIVE  NEGATIVE    Ketones, ur NEGATIVE  NEGATIVE mg/dL    Protein, ur 100 (*) NEGATIVE mg/dL    Urobilinogen, UA 1.0  0.0 - 1.0 mg/dL    Nitrite NEGATIVE  NEGATIVE    Leukocytes, UA NEGATIVE  NEGATIVE   URINE MICROSCOPIC-ADD ON     Status: Abnormal   Collection Time   09/05/11 12:54 AM      Component Value Range Comment   Squamous Epithelial / LPF RARE  RARE    WBC, UA 0-2  <3 WBC/hpf    RBC /  HPF 3-6  <3 RBC/hpf    Bacteria, UA RARE  RARE    Casts HYALINE CASTS (*) NEGATIVE   PROTIME-INR     Status: Normal   Collection Time   09/05/11  3:59 PM      Component Value Range Comment   Prothrombin Time 14.6  11.6 - 15.2 seconds    INR 1.12  0.00 - 1.49   ANTITHROMBIN III     Status: Normal   Collection Time   09/05/11  3:59 PM      Component Value Range Comment   AntiThromb III Func 79  75 - 120 %     No results found.  Review of Systems  Constitutional: Positive for malaise/fatigue. Negative for fever.  Skin: Positive for rash.   Blood pressure 126/71, pulse 126, temperature 98 F (36.7 C), temperature source Oral, resp. rate 20, height 5\' 11"  (1.803 m), weight 162.8 kg (358 lb 14.5 oz), SpO2 99.00%. Physical Exam Bilateral lower extremities are noted to have marked edema.  There are purpuric patches around the margins of the feet above the soles, and multiple discrete and confluent irregular purpuric macules and patches, many of which have tense vesicles centrally that do not extend with pressure (negative Asboe Hansen sign).  At his right upper inner thigh there is a 3cm dermal tender nodule.  There scattered papules on his upper extremities.  The oropharynx and eyes are clear.  Assessment/Plan: Drug reaction versus vasculitis versus erythema multiforme.  Discussed with the requesting physcian, Dr. Blenda Nicely.  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   Biopsies of lesional and perilesional skin  of the right lower extremity were performed for routine histology and direct immuno fluorescence.  I&D of the upper inner thigh mass revealed only bloody discharge, bacterial culture and sensitivity obtained.  Discussed with Dr. Blenda Nicely, who has ordered coagulation studies, ANA, RPR, HIV, among other studies.    The purpuric eruption does not look primarily infectious.  Start IV Solumedrol, and transition to prednisone taper for discharge if he progresses as expected, 80mg  to 0 over 14 days.    Simona Huh 09/05/2011, 5:29 PM

## 2011-09-05 NOTE — Progress Notes (Signed)
ANTICOAGULATION CONSULT NOTE - Initial Consult  Pharmacy Consult for Heparin and Coumadin Indication: Bilateral DVTs  No Known Allergies  Patient Measurements: Height: 5\' 11"  (180.3 cm) Weight: 358 lb 14.5 oz (162.8 kg) IBW/kg (Calculated) : 75.3  Heparin Dosing Weight: 114.7kg  Vital Signs: Temp: 98.4 F (36.9 C) (07/04 0348) Temp src: Oral (07/04 0348) BP: 142/86 mmHg (07/04 0930) Pulse Rate: 112  (07/04 0930)  Labs:  [name] 09/04/11 2158  HGB 13.3  HCT 39.0  PLT 318  APTT --  LABPROT --  INR --  HEPARINUNFRC --  CREATININE 1.24  CKTOTAL --  CKMB --  TROPONINI --    Estimated Creatinine Clearance: 122.3 ml/min (by C-G formula based on Cr of 1.24).   Medical History: Past Medical History  Diagnosis Date  . Hypertension   . Hyperlipidemia   . Obesity   . Heart murmur     "when I was small"  . Anginal pain   . Pneumonia 1988    "walking"  . Staph infection 09/04/11    BLE/wife's report    Medications:  Prescriptions prior to admission  Medication Sig Dispense Refill  . Aspirin-Acetaminophen-Caffeine (GOODY HEADACHE PO) Take 1 packet by mouth daily as needed. For pain      . losartan (COZAAR) 100 MG tablet Take 100 mg by mouth daily.      . naproxen sodium (ANAPROX) 220 MG tablet Take 220 mg by mouth daily as needed. For pain      . OVER THE COUNTER MEDICATION Take 2 tablets by mouth every 8 (eight) hours as needed. For pain. (OTC pain reliever)        Assessment: 40yom to start Heparin and Coumadin (Day 1 of minimum 5 Day overlap) for bilateral DVTs. Patient received Lovenox 40mg  this AM ~0950 - will not have a major influence but will not order full heparin bolus. Patient is not taking any anticoagulants. - Baseline INR ordered - H/H and Plts wnl - AST/ALT wnl - No significant bleeding reported - Heparin weight: 114.7kg - Warfarin points: 11  Goal of Therapy:  INR 2-3 Heparin level 0.3-0.7 units/ml Monitor platelets by anticoagulation  protocol: Yes   Plan:  1. Heparin IV bolus 4000 units x 1 2. Heparin drip 1900 units/hr (19 ml/hr) 3. Check heparin level 6 hours after heparin initiation 4. Coumadin 10mg  po x 1 today (confirm baseline INR prior to administration) 5. Daily INR, heparin level and CBC 6. Coumadin educational book/video  Cleon Dew [phone] 09/05/2011,3:34 PM

## 2011-09-05 NOTE — Progress Notes (Addendum)
  Comment:  Patient has just completed vascular doppler of both LE, which confirmed bilateral LE DVTs. 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 He has no discernible family history of VTE at a young age.   Plan: Have sent off hypercoagulable panel, and ordered anticoagulation with iv Heparin and concomitant Coumadin.   Note: Dr Terri Piedra, dermatologist has seen patient today, examined the eruption, and feels that a vasculitis has to be excluded. He did a punch biopsy, and has recommended iv Solumedrol for now, transitioned to oral prednisone taper at discharge, starting with 80 mg, then tapering to zero, over 16 days. Also he did take a swab from right upper thigh wound which was sent for culture.  UDS, ANA, HIV test, RPR and hep C and B antibody screen, sent off.

## 2011-09-05 NOTE — Progress Notes (Signed)
Subjective: No new issues.   Objective: Vital signs in last 24 hours: Temp:  [98.4 F (36.9 C)-100.8 F (38.2 C)] 98.4 F (36.9 C) (07/04 0348) Pulse Rate:  [108-124] 117  (07/04 0348) Resp:  [18-20] 18  (07/04 0348) BP: (116-146)/(58-100) 134/88 mmHg (07/04 0348) SpO2:  [96 %-100 %] 100 % (07/04 0348) Weight:  [162.8 kg (358 lb 14.5 oz)] 162.8 kg (358 lb 14.5 oz) (07/04 0348) Weight change:  Last BM Date: 09/04/11  Intake/Output from previous day: 07/03 0701 - 07/04 0700 In: 196.7 [P.O.:110; I.V.:86.7] Out: 400 [Urine:400]     Physical Exam: General: Comfortable, alert, communicative, fully oriented, not short of breath at rest.  HEENT:  No clinical pallor, no jaundice, no conjunctival injection or discharge. NECK:  Supple, JVP not seen, no carotid bruits, no palpable lymphadenopathy, no palpable goiter. CHEST:  Clinically clear to auscultation, no wheezes, no crackles. HEART:  Sounds 1 and 2 heard, normal, regular, no murmurs. ABDOMEN:  Obese, soft, non-tender, no palpable organomegaly, no palpable masses, normal bowel sounds. GENITALIA:  Not examined. LOWER EXTREMITIES:  No pitting edema, palpable peripheral pulses. MUSCULOSKELETAL SYSTEM:  Generalized osteoarthritic changes, otherwise, normal. CENTRAL NERVOUS SYSTEM:  No focal neurologic deficit on gross examination. SKIN: Patient has a raised erythematous rash involving bilateral LE, from dorsum of feet to both thighs, with vesiculation and pustulation. Also involvement of both UE, although far more sparse in distribution. There is sparing of trunk and face. The lesions are painful, although he had earlier had some itching.   Lab Results:  [facility] 09/04/11 2158  WBC 16.7*  HGB 13.3  HCT 39.0  PLT 318    Basename 09/04/11 2158  NA 142  K 3.8  CL 101  CO2 28  GLUCOSE 98  BUN 14  CREATININE 1.24  CALCIUM 9.0   No results found for this or any previous visit (from the past 240 hour(s)).    Studies/Results: No results found.  Medications: Scheduled Meds:   . sodium chloride   Intravenous STAT  . acetaminophen  650 mg Oral Once  . cefTRIAXone (ROCEPHIN)  IV  1 g Intravenous Once  . enoxaparin  40 mg Subcutaneous Q24H  . hydrOXYzine  25 mg Oral Once  . losartan  100 mg Oral Daily  . oxyCODONE-acetaminophen  1 tablet Oral Once  . piperacillin-tazobactam (ZOSYN)  IV  3.375 g Intravenous Q8H  . sodium chloride  1,000 mL Intravenous Once  . sodium chloride  3 mL Intravenous Q12H  . vancomycin  1,500 mg Intravenous Q8H  . vancomycin  1,000 mg Intravenous Once  . DISCONTD: ceFAZolin (ANCEF) IM  1 g Intramuscular Once   Continuous Infusions:   . sodium chloride 50 mL/hr at 09/05/11 0416   PRN Meds:.acetaminophen, acetaminophen, HYDROmorphone (DILAUDID) injection, ondansetron (ZOFRAN) IV, ondansetron  Assessment/Plan:  Active Problems: 1. Dermatosis: Patient presented with an erythematous eruption, involving upper and lower extremities, complicated by vesiculation and pustulation. He denies any new medication or possible contact with contact allergens, such a poison ivy. Per spouse, when the rash initially appeared on 09/03/11, he utilized topical Hydrocortisone, without improvement. He has been commenced on iv Vanc/Zosyn, now day#1, on suspicion of cellulitis. Though I doubt cellulitis, this combination will be adequate for secondary bacterial infection, which may have been introduced by excoriation, particularly, as patient did have a low grade pyrexia, is mildly tachycardic, and has a significant leukocytosis. Steroids may be necessary. Patient will certainly benefit from dermatology input. Will place a consultation request.  2. HTN (hypertension): Controlled on ARB.    LOS: 1 day   Darric Plante,CHRISTOPHER 09/05/2011, 8:50 AM

## 2011-09-05 NOTE — H&P (Signed)
Parker West is an 41 y.o. male.   PCP - Dr.Lowne,Yvonne. Chief Complaint: Swelling and erythema lower extremities. HPI: 41 year-old male show hypertension and morbid obesity has been experiencing swelling pain and erythema of the lower extremity with some boils in the lower extremity with some discharge. This has been going on for last 2 days and progressing. There were some balls formed in the left forearm also. Patient denies any fever chills. Has pain in both lower extremity. In the ER patient was found to have leukocytosis and was mildly febrile. At this time patient is being admitted for cellulitis of the lower extremity. His pain is controlled pain relief medications. Patient states he has been having some boils the right groin area for some time.   Past Medical History  Diagnosis Date  . Hypertension   . Hyperlipidemia   . Obesity   . Heart murmur     "when I was small"  . Anginal pain   . Pneumonia 1988    "walking"  . Staph infection 09/04/11    BLE/wife's report    Past Surgical History  Procedure Date  . Reduction mammaplasty 1990's    Family History  Problem Relation Age of Onset  . Stroke Mother   . Hypertension Mother   . Diabetes Mother   . Hypertension Father    Social History:  reports that he has never smoked. He has never used smokeless tobacco. He reports that he drinks alcohol. He reports that he does not use illicit drugs.  Allergies: No Known Allergies  Medications Prior to Admission  Medication Sig Dispense Refill  . Aspirin-Acetaminophen-Caffeine (GOODY HEADACHE PO) Take 1 packet by mouth daily as needed. For pain      . losartan (COZAAR) 100 MG tablet Take 100 mg by mouth daily.      . naproxen sodium (ANAPROX) 220 MG tablet Take 220 mg by mouth daily as needed. For pain      . OVER THE COUNTER MEDICATION Take 2 tablets by mouth every 8 (eight) hours as needed. For pain. (OTC pain reliever)        Results for orders placed during the hospital  encounter of 09/04/11 (from the past 48 hour(s))  CBC WITH DIFFERENTIAL     Status: Abnormal   Collection Time   09/04/11  9:58 PM      Component Value Range Comment   WBC 16.7 (*) 4.0 - 10.5 K/uL    RBC 5.44  4.22 - 5.81 MIL/uL    Hemoglobin 13.3  13.0 - 17.0 g/dL    HCT 62.1  30.8 - 65.7 %    MCV 71.7 (*) 78.0 - 100.0 fL    MCH 24.4 (*) 26.0 - 34.0 pg    MCHC 34.1  30.0 - 36.0 g/dL    RDW 84.6 (*) 96.2 - 15.5 %    Platelets 318  150 - 400 K/uL    Neutrophils Relative 77  43 - 77 %    Lymphocytes Relative 13  12 - 46 %    Monocytes Relative 9  3 - 12 %    Eosinophils Relative 1  0 - 5 %    Basophils Relative 0  0 - 1 %    Neutro Abs 12.8 (*) 1.7 - 7.7 K/uL    Lymphs Abs 2.2  0.7 - 4.0 K/uL    Monocytes Absolute 1.5 (*) 0.1 - 1.0 K/uL    Eosinophils Absolute 0.2  0.0 - 0.7 K/uL  Basophils Absolute 0.0  0.0 - 0.1 K/uL    RBC Morphology POLYCHROMASIA PRESENT      Smear Review LARGE PLATELETS PRESENT     COMPREHENSIVE METABOLIC PANEL     Status: Abnormal   Collection Time   09/04/11  9:58 PM      Component Value Range Comment   Sodium 142  135 - 145 mEq/L    Potassium 3.8  3.5 - 5.1 mEq/L    Chloride 101  96 - 112 mEq/L    CO2 28  19 - 32 mEq/L    Glucose, Bld 98  70 - 99 mg/dL    BUN 14  6 - 23 mg/dL    Creatinine, Ser 1.61  0.50 - 1.35 mg/dL    Calcium 9.0  8.4 - 09.6 mg/dL    Total Protein 8.0  6.0 - 8.3 g/dL    Albumin 3.1 (*) 3.5 - 5.2 g/dL    AST 14  0 - 37 U/L    ALT 11  0 - 53 U/L    Alkaline Phosphatase 81  39 - 117 U/L    Total Bilirubin 0.3  0.3 - 1.2 mg/dL    GFR calc non Af Amer 71 (*) >90 mL/min    GFR calc Af Amer 82 (*) >90 mL/min   URINALYSIS, ROUTINE W REFLEX MICROSCOPIC     Status: Abnormal   Collection Time   09/05/11 12:54 AM      Component Value Range Comment   Color, Urine YELLOW  YELLOW    APPearance CLEAR  CLEAR    Specific Gravity, Urine 1.016  1.005 - 1.030    pH 6.0  5.0 - 8.0    Glucose, UA NEGATIVE  NEGATIVE mg/dL    Hgb urine dipstick  MODERATE (*) NEGATIVE    Bilirubin Urine NEGATIVE  NEGATIVE    Ketones, ur NEGATIVE  NEGATIVE mg/dL    Protein, ur 045 (*) NEGATIVE mg/dL    Urobilinogen, UA 1.0  0.0 - 1.0 mg/dL    Nitrite NEGATIVE  NEGATIVE    Leukocytes, UA NEGATIVE  NEGATIVE   URINE MICROSCOPIC-ADD ON     Status: Abnormal   Collection Time   09/05/11 12:54 AM      Component Value Range Comment   Squamous Epithelial / LPF RARE  RARE    WBC, UA 0-2  <3 WBC/hpf    RBC / HPF 3-6  <3 RBC/hpf    Bacteria, UA RARE  RARE    Casts HYALINE CASTS (*) NEGATIVE    No results found.  Review of Systems  Constitutional: Negative.   HENT: Negative.   Eyes: Negative.   Respiratory: Negative.   Cardiovascular: Negative.   Gastrointestinal: Negative.   Genitourinary: Negative.   Musculoskeletal:       Pain and swelling of the lower extremities.  Skin: Positive for rash.  Neurological: Negative.   Endo/Heme/Allergies: Negative.   Psychiatric/Behavioral: Negative.     Blood pressure 134/58, pulse 108, temperature 98.5 F (36.9 C), temperature source Oral, resp. rate 20, SpO2 96.00%. Physical Exam  Constitutional: He is oriented to person, place, and time. He appears well-developed and well-nourished. No distress.  HENT:  Head: Normocephalic and atraumatic.  Right Ear: External ear normal.  Left Ear: External ear normal.  Nose: Nose normal.  Mouth/Throat: Oropharynx is clear and moist. No oropharyngeal exudate.  Eyes: Conjunctivae are normal. Pupils are equal, round, and reactive to light. Right eye exhibits no discharge. Left eye exhibits no discharge. No  scleral icterus.  Neck: Normal range of motion. Neck supple.  Cardiovascular: Normal rate and regular rhythm.   Respiratory: Effort normal and breath sounds normal. No respiratory distress. He has no wheezes. He has no rales.  GI: Soft. Bowel sounds are normal. He exhibits no distension. There is no tenderness. There is no rebound.  Musculoskeletal:       Lower  extremity swelling and erythema with blisters knee downwards both sides. 3 plus edema. Some blisters in the left forearm.  Neurological: He is alert and oriented to person, place, and time.       Moves all extremities.  Skin: Skin is warm. He is not diaphoretic.  Psychiatric: His behavior is normal.     Assessment/Plan #1. Cellulitis of the both lower extremities - given patient's leukocytosis and mild fever and erythematous swelling of the lower extremity the most likely causes cellulitis of both lower extremities. I have placed patient on vancomycin and Zosyn. Obtain blood cultures. Keep legs elevated. Patient's leg is swollen and has to be watched for any compartment syndrome formation. Check Dopplers of lower extremity to rule out DVT. #2. History of hypertension - continue ARB.   CODE STATUS - full code.   Kimmberly Wisser N. 09/05/2011, 3:49 AM

## 2011-09-05 NOTE — Progress Notes (Addendum)
*  Preliminary Results* Bilateral lower extremity venous duplex completed. Bilateral lower extremities are positive for deep vein thrombosis involving the right popliteal vein, left saphenofemoral junction and left common femoral vein.  09/05/2011 2:56 PM Gertie Fey, RDMS, RDCS

## 2011-09-05 NOTE — ED Provider Notes (Signed)
History     CSN: [id number]  Arrival date & time 09/04/11  2112   First MD Initiated Contact with Patient 09/04/11 2247      Chief Complaint  Patient presents with  . Cellulitis  . Rash    (Consider location/radiation/quality/duration/timing/severity/associated sxs/prior treatment) HPI Comments: Patient is a morbidly obese African American male with a history of hyperlipidemia and hypertension that presents to the emergency department with the chief complaint of rash.  Onset of symptoms began about 2 days ago and started on his left shin.  Rash has since spread upwards and is now on both legs bilaterally, pruritic in nature, extremely painful, erythematous, located on lower extremities from feet to upper thighs and arms but sparing trunk neck and face.  Associated symptoms are chills, fever, leg swelling, and severe pain.  Patient denies any dysphasia, difficulty breathing, shortness of breath, chest pain, IV drug use, history of diabetes, MRSA history, recent insect bite, nausea, vomiting, abdominal pain, change in bowel movements, neck pain/stiffness, or headache.  No other complaints at this time.  The history is provided by the patient.    Past Medical History  Diagnosis Date  . Hypertension   . Hyperlipidemia   . Obesity     History reviewed. No pertinent past surgical history.  Family History  Problem Relation Age of Onset  . Stroke Mother   . Hypertension Mother   . Diabetes Mother   . Hypertension Father     History  Substance Use Topics  . Smoking status: Never Smoker   . Smokeless tobacco: Not on file  . Alcohol Use: Yes     Social      Review of Systems  Skin: Positive for color change and rash.  All other systems reviewed and are negative.    Allergies  Review of patient's allergies indicates no known allergies.  Home Medications   Current Outpatient Rx  Name Route Sig Dispense Refill  . GOODY HEADACHE PO Oral Take 1 packet by mouth daily as  needed. For pain    . LOSARTAN POTASSIUM 100 MG PO TABS Oral Take 100 mg by mouth daily.    Marland Kitchen NAPROXEN SODIUM 220 MG PO TABS Oral Take 220 mg by mouth daily as needed. For pain    . OVER THE COUNTER MEDICATION Oral Take 2 tablets by mouth every 8 (eight) hours as needed. For pain. (OTC pain reliever)      BP 146/100  Pulse 124  Temp 100.8 F (38.2 C) (Oral)  Resp 20  SpO2 97%  Physical Exam  Nursing note and vitals reviewed. Constitutional: He is oriented to person, place, and time. He appears well-developed and well-nourished. No distress.  HENT:  Head: Normocephalic and atraumatic.       Oropharynx clear and moist.  Eyes: Conjunctivae and EOM are normal.  Neck: Normal range of motion.       Flexion and extension of neck without difficulty or pain.  Cardiovascular:       Tachycardic, no aberrancy and auscultation.  Pulmonary/Chest: Effort normal.       Lungs clear to auscultation bilaterally, no wheezing or stridor.  Abdominal:       Abdomen soft, obese, nontender.  Musculoskeletal: Normal range of motion.  Neurological: He is alert and oriented to person, place, and time.  Skin: Skin is warm and dry. Rash noted. He is not diaphoretic.       Rash located on lower legs bilaterally from thighs to toes.  Multiple large  round erythematous base with central pustular or nodular lesion.  2 areas of weeping located on anterior shins bilaterally.  Skin warm and tender to palpation.    Psychiatric: He has a normal mood and affect. His behavior is normal.    ED Course  Procedures (including critical care time)  Labs Reviewed  CBC WITH DIFFERENTIAL - Abnormal; Notable for the following:    WBC 16.7 (*)     MCV 71.7 (*)     MCH 24.4 (*)     RDW 16.2 (*)     Neutro Abs 12.8 (*)     Monocytes Absolute 1.5 (*)     All other components within normal limits  COMPREHENSIVE METABOLIC PANEL - Abnormal; Notable for the following:    Albumin 3.1 (*)     GFR calc non Af Amer 71 (*)     GFR  calc Af Amer 82 (*)     All other components within normal limits  URINALYSIS, ROUTINE W REFLEX MICROSCOPIC   No results found.   No diagnosis found.    MDM  Rash c/w staph type skin infection  Obese patient presents emergency department with rash, onset 2 days ago, temperature of 100.8, tachycardic 124 and hypertensive.  He has been treated with fluids, pain medication, and antibiotics (vancomycin and Rocephin) in the emergency department.  [facility] to admit.        Jaci Carrel, [place] 09/05/11 [phone]

## 2011-09-05 NOTE — Progress Notes (Signed)
ANTIBIOTIC CONSULT NOTE - INITIAL  Pharmacy Consult for Vancomycin and Zosyn Indication: cellulitis  No Known Allergies  Patient Measurements: Height: 5\' 11"  (180.3 cm) Weight: 358 lb 14.5 oz (162.8 kg) IBW/kg (Calculated) : 75.3  Adjusted Body Weight: 110  Vital Signs: Temp: 98.4 F (36.9 C) (07/04 0348) Temp src: Oral (07/04 0348) BP: 134/88 mmHg (07/04 0348) Pulse Rate: 117  (07/04 0348)  Labs:  Parker West 09/04/11 2158  WBC 16.7*  HGB 13.3  PLT 318  LABCREA --  CREATININE 1.24   Estimated Creatinine Clearance: 122.3 ml/min (by C-G formula based on Cr of 1.24). No results found for this basename: VANCOTROUGH:2,VANCOPEAK:2,VANCORANDOM:2,GENTTROUGH:2,GENTPEAK:2,GENTRANDOM:2,TOBRATROUGH:2,TOBRAPEAK:2,TOBRARND:2,AMIKACINPEAK:2,AMIKACINTROU:2,AMIKACIN:2, in the last 72 hours   Microbiology: No results found for this or any previous visit (from the past 720 hour(s)).  Medical History: Past Medical History  Diagnosis Date  . Hypertension   . Hyperlipidemia   . Obesity   . Heart murmur     "when I was small"  . Anginal pain   . Pneumonia 1988    "walking"  . Staph infection 09/04/11    BLE/wife's report    Medications:  Prescriptions prior to admission  Medication Sig Dispense Refill  . Aspirin-Acetaminophen-Caffeine (GOODY HEADACHE PO) Take 1 packet by mouth daily as needed. For pain      . losartan (COZAAR) 100 MG tablet Take 100 mg by mouth daily.      . naproxen sodium (ANAPROX) 220 MG tablet Take 220 mg by mouth daily as needed. For pain      . OVER THE COUNTER MEDICATION Take 2 tablets by mouth every 8 (eight) hours as needed. For pain. (OTC pain reliever)       Assessment: 41 yo male with cellulitis for empiric antibiotics.  Vancomycin 1 g IV given in ED at 0100.  Goal of Therapy:  Vancomycin trough level 10-15 mcg/ml  Plan:  Vancomycin 1500 mg IV q8h Zosyn 3.375 g IV q8h   Parker West 09/05/2011,4:15 AM

## 2011-09-06 DIAGNOSIS — L0291 Cutaneous abscess, unspecified: Secondary | ICD-10-CM

## 2011-09-06 DIAGNOSIS — R21 Rash and other nonspecific skin eruption: Secondary | ICD-10-CM

## 2011-09-06 DIAGNOSIS — I82409 Acute embolism and thrombosis of unspecified deep veins of unspecified lower extremity: Secondary | ICD-10-CM

## 2011-09-06 LAB — COMPREHENSIVE METABOLIC PANEL
ALT: 12 U/L (ref 0–53)
Alkaline Phosphatase: 70 U/L (ref 39–117)
BUN: 16 mg/dL (ref 6–23)
CO2: 22 mEq/L (ref 19–32)
GFR calc Af Amer: 89 mL/min — ABNORMAL LOW (ref >90)
GFR calc non Af Amer: 77 mL/min — ABNORMAL LOW (ref >90)
Glucose, Bld: 160 mg/dL — ABNORMAL HIGH (ref 70–99)
Potassium: 3.9 mEq/L (ref 3.5–5.1)
Sodium: 138 mEq/L (ref 135–145)
Total Bilirubin: 0.2 mg/dL — ABNORMAL LOW (ref 0.3–1.2)

## 2011-09-06 LAB — LUPUS ANTICOAGULANT PANEL
DRVVT: 43.7 secs (ref <45.1)
PTT Lupus Anticoagulant: 43.4 secs — ABNORMAL HIGH (ref 28.0–43.0)
PTTLA 4:1 Mix: 45.4 secs — ABNORMAL HIGH (ref 28.0–43.0)
PTTLA Confirmation: 16.7 secs — ABNORMAL HIGH (ref <8.0)

## 2011-09-06 LAB — RPR: RPR Ser Ql: NONREACTIVE

## 2011-09-06 LAB — HEPATITIS B SURFACE ANTIBODY,QUALITATIVE: Hep B S Ab: POSITIVE — AB

## 2011-09-06 LAB — CBC
HCT: 34.8 % — ABNORMAL LOW (ref 39.0–52.0)
Hemoglobin: 12.1 g/dL — ABNORMAL LOW (ref 13.0–17.0)
MCH: 25.1 pg — ABNORMAL LOW (ref 26.0–34.0)
RBC: 4.83 MIL/uL (ref 4.22–5.81)

## 2011-09-06 LAB — HIV ANTIBODY (ROUTINE TESTING W REFLEX): HIV: NONREACTIVE

## 2011-09-06 LAB — PROTEIN C ACTIVITY: Protein C Activity: 161 % — ABNORMAL HIGH (ref 75–133)

## 2011-09-06 LAB — ANA: Anti Nuclear Antibody(ANA): NEGATIVE

## 2011-09-06 MED ORDER — ENOXAPARIN SODIUM 150 MG/ML ~~LOC~~ SOLN
160.0000 mg | Freq: Two times a day (BID) | SUBCUTANEOUS | Status: DC
  Filled 2011-09-06 (×2): qty 2

## 2011-09-06 MED ORDER — SODIUM CHLORIDE 0.9 % IJ SOLN
10.0000 mL | Freq: Two times a day (BID) | INTRAMUSCULAR | Status: DC
  Administered 2011-09-07 – 2011-09-09 (×4): 10 mL via INTRAVENOUS

## 2011-09-06 MED ORDER — ENOXAPARIN SODIUM 150 MG/ML ~~LOC~~ SOLN
160.0000 mg | Freq: Two times a day (BID) | SUBCUTANEOUS | Status: DC
  Administered 2011-09-06 – 2011-09-10 (×9): 160 mg via SUBCUTANEOUS
  Filled 2011-09-06 (×17): qty 2

## 2011-09-06 MED ORDER — AMLODIPINE BESYLATE 5 MG PO TABS
5.0000 mg | ORAL_TABLET | Freq: Every day | ORAL | Status: DC
  Administered 2011-09-06: 5 mg via ORAL
  Filled 2011-09-06 (×2): qty 1

## 2011-09-06 MED ORDER — WARFARIN SODIUM 10 MG PO TABS
10.0000 mg | ORAL_TABLET | Freq: Once | ORAL | Status: AC
  Administered 2011-09-06: 10 mg via ORAL
  Filled 2011-09-06: qty 1

## 2011-09-06 MED ORDER — VANCOMYCIN HCL 1000 MG IV SOLR
1500.0000 mg | Freq: Two times a day (BID) | INTRAVENOUS | Status: DC
  Administered 2011-09-06 – 2011-09-07 (×2): 1500 mg via INTRAVENOUS
  Filled 2011-09-06 (×3): qty 1500

## 2011-09-06 NOTE — Progress Notes (Signed)
INITIAL ADULT NUTRITION ASSESSMENT Date: 09/06/2011   Time: 11:45 AM Reason for Assessment: Consult  ASSESSMENT: Male 41 y.o.  Dx: Cellulitis of multiple sites of lower extremity  Hx:  Past Medical History  Diagnosis Date  . Hypertension   . Hyperlipidemia   . Obesity   . Heart murmur     "when I was small"  . Anginal pain   . Pneumonia 1988    "walking"  . Staph infection 09/04/11    BLE/wife's report    Related Meds:     . sodium chloride   Intravenous STAT  . amLODipine  5 mg Oral Daily  . coumadin book   Does not apply Once  . enoxaparin (LOVENOX) injection  160 mg Subcutaneous Q12H  . heparin  4,000 Units Intravenous Once  . losartan  100 mg Oral Daily  . methylPREDNISolone (SOLU-MEDROL) injection  80 mg Intravenous Q8H  . piperacillin-tazobactam (ZOSYN)  IV  3.375 g Intravenous Q8H  . sodium chloride  10 mL Intravenous Q12H  . sodium chloride  3 mL Intravenous Q12H  . vancomycin  1,500 mg Intravenous Q8H  . warfarin  10 mg Oral Once  . warfarin   Does not apply Once  . Warfarin - Pharmacist Dosing Inpatient   Does not apply q1800  . DISCONTD: enoxaparin (LOVENOX) injection  160 mg Subcutaneous Q12H  . DISCONTD: enoxaparin  40 mg Subcutaneous Q24H     Ht: 5\' 11"  (180.3 cm)  Wt: 359 lb 1.6 oz (162.887 kg)  Ideal Wt: 78.2 kg % Ideal Wt: 209%  Usual Wt:  Wt Readings from Last 10 Encounters:  09/06/11 359 lb 1.6 oz (162.887 kg)  07/14/11 331 lb (150.141 kg)  05/25/11 338 lb (153.316 kg)  09/07/10 356 lb (161.481 kg)  05/12/09 335 lb (151.955 kg)  04/21/09 337 lb (152.862 kg)  02/17/09 347 lb (157.398 kg)    % Usual Wt: >100%  Body mass index is 50.08 kg/(m^2). Pt is morbidly obese.   Food/Nutrition Related Hx: No nutrition problems indicated per nutrition risk report completed on admission   Labs:  CMP     Component Value Date/Time   NA 138 09/06/2011 0700   K 3.9 09/06/2011 0700   CL 103 09/06/2011 0700   CO2 22 09/06/2011 0700   GLUCOSE 160*  09/06/2011 0700   BUN 16 09/06/2011 0700   CREATININE 1.16 09/06/2011 0700   CALCIUM 8.6 09/06/2011 0700   PROT 7.5 09/06/2011 0700   ALBUMIN 2.9* 09/06/2011 0700   AST 12 09/06/2011 0700   ALT 12 09/06/2011 0700   ALKPHOS 70 09/06/2011 0700   BILITOT 0.2* 09/06/2011 0700   GFRNONAA 77* 09/06/2011 0700   GFRAA 89* 09/06/2011 0700   Lab Results  Component Value Date   HGBA1C 6.1 04/21/2009   Lipid Panel     Component Value Date/Time   CHOL 194 04/21/2009 0933   TRIG 197.0* 04/21/2009 0933   HDL 38.90* 04/21/2009 0933   CHOLHDL 5 04/21/2009 0933   VLDL 39.4 04/21/2009 0933   LDLCALC 116* 04/21/2009 0933      Intake/Output Summary (Last 24 hours) at 09/06/11 1148 Last data filed at 09/06/11 0830  Gross per 24 hour  Intake 6064.62 ml  Output      0 ml  Net 6064.62 ml     Diet Order: General  Supplements/Tube Feeding: none  IVF:    DISCONTD: sodium chloride Last Rate: 100 mL/hr at 09/05/11 2138  DISCONTD: heparin Last Rate: 1,900 Units/hr (09/06/11  1478)    Estimated Nutritional Needs:   Kcal:  1900-2100 to promote weight loss  Protein: 90-100 gm  Fluid:  > 2 L   Pt admitted with multiple areas of rash on BLE.  Pt currently with elevated BS, likely r/t steroids, last A1c was done in 2011 and was WNL. If CBG's continue to be above normal limits, recommend rechecking A1c.   Pt requesting diet education for weight loss with himself and his wife present. RD set up meeting for later this afternoon to provide education.   NUTRITION DIAGNOSIS: -Food and nutrition related knowledge deficit (NB-1.1).  Status: Ongoing  RELATED TO: limited previous diet education   AS EVIDENCE BY: morbid obesity  MONITORING/EVALUATION(Goals): Goal: Pt will follow provided recommendations for weight loss to achieve 1-2 lb weight loss per week post d/c.   Monitor: follow up education needs  EDUCATION NEEDS: -Education needs addressed -Meeting set with pt and wife for this afternoon for diet  education  INTERVENTION: 1. Diet education 2. RD will follow while inpatient and recommend followed by out pt RD if pt willing   DOCUMENTATION CODES Per approved criteria  -Morbid Obesity    Clarene Duke RD, LDN Pager [phone] After Hours pager [phone]

## 2011-09-06 NOTE — Progress Notes (Addendum)
Subjective: Feels better.  Objective: Vital signs in last 24 hours: Temp:  [98 F (36.7 C)-99.2 F (37.3 C)] 98.3 F (36.8 C) (07/05 0551) Pulse Rate:  [103-126] 103  (07/05 0551) Resp:  [20-22] 22  (07/05 0551) BP: (126-162)/(71-102) 161/102 mmHg (07/05 0551) SpO2:  [95 %-99 %] 99 % (07/05 0551) Weight:  [162.887 kg (359 lb 1.6 oz)] 162.887 kg (359 lb 1.6 oz) (07/05 0500) Weight change: 0.087 kg (3.1 oz) Last BM Date: 09/04/11  Intake/Output from previous day: 07/04 0701 - 07/05 0700 In: 3532.1 [P.O.:200; I.V.:232.1; IV Piggyback:3100] Out: -      Physical Exam: General: Comfortable, alert, communicative, fully oriented, not short of breath at rest.  HEENT:  No clinical pallor, no jaundice, no conjunctival injection or discharge. NECK:  Supple, JVP not seen, no carotid bruits, no palpable lymphadenopathy, no palpable goiter. CHEST:  Clinically clear to auscultation, no wheezes, no crackles. HEART:  Sounds 1 and 2 heard, normal, regular, no murmurs. ABDOMEN:  Obese, soft, non-tender, no palpable organomegaly, no palpable masses, normal bowel sounds. GENITALIA:  Not examined. LOWER EXTREMITIES:  No pitting edema, palpable peripheral pulses. MUSCULOSKELETAL SYSTEM:  Generalized osteoarthritic changes, otherwise, normal. CENTRAL NERVOUS SYSTEM:  No focal neurologic deficit on gross examination. SKIN: Patient has a raised erythematous rash involving bilateral LE, from dorsum of feet to both thighs, with vesiculation and pustulation. Also involvement of both UE, although far more sparse in distribution. There is sparing of trunk and face. The lesions are painful, although he had earlier had some itching. Today, eruption appears appreciably better.   Lab Results:  Basename 09/06/11 0700 09/04/11 2158  WBC 18.7* 16.7*  HGB 12.1* 13.3  HCT 34.8* 39.0  PLT 279 318    Basename 09/04/11 2158  NA 142  K 3.8  CL 101  CO2 28  GLUCOSE 98  BUN 14  CREATININE 1.24  CALCIUM 9.0    Recent Results (from the past 240 hour(s))  CULTURE, BLOOD (ROUTINE X 2)     Status: Normal (Preliminary result)   Collection Time   09/05/11  2:40 AM      Component Value Range Status Comment   Specimen Description BLOOD RIGHT ARM   Final    Special Requests BOTTLES DRAWN AEROBIC AND ANAEROBIC 3CC EACH   Final    Culture  Setup Time 09/05/2011 16:55   Final    Culture     Final    Value:        BLOOD CULTURE RECEIVED NO GROWTH TO DATE CULTURE WILL BE HELD FOR 5 DAYS BEFORE ISSUING A FINAL NEGATIVE REPORT   Report Status PENDING   Incomplete   CULTURE, BLOOD (ROUTINE X 2)     Status: Normal (Preliminary result)   Collection Time   09/05/11  2:45 AM      Component Value Range Status Comment   Specimen Description BLOOD RIGHT HAND   Final    Special Requests BOTTLES DRAWN AEROBIC ONLY 2CC   Final    Culture  Setup Time 09/05/2011 16:55   Final    Culture     Final    Value:        BLOOD CULTURE RECEIVED NO GROWTH TO DATE CULTURE WILL BE HELD FOR 5 DAYS BEFORE ISSUING A FINAL NEGATIVE REPORT   Report Status PENDING   Incomplete   CULTURE, ROUTINE-ABSCESS     Status: Normal (Preliminary result)   Collection Time   09/05/11  4:12 PM      Component  Value Range Status Comment   Specimen Description ABSCESS GROIN   Final    Special Requests NONE   Final    Gram Stain PENDING   Incomplete    Culture NO GROWTH 1 DAY   Final    Report Status PENDING   Incomplete      Studies/Results: No results found.  Medications: Scheduled Meds:    . sodium chloride   Intravenous STAT  . coumadin book   Does not apply Once  . heparin  4,000 Units Intravenous Once  . losartan  100 mg Oral Daily  . methylPREDNISolone (SOLU-MEDROL) injection  80 mg Intravenous Q8H  . piperacillin-tazobactam (ZOSYN)  IV  3.375 g Intravenous Q8H  . sodium chloride  3 mL Intravenous Q12H  . vancomycin  1,500 mg Intravenous Q8H  . warfarin  10 mg Oral Once  . warfarin   Does not apply Once  . Warfarin - Pharmacist  Dosing Inpatient   Does not apply q1800  . DISCONTD: enoxaparin  40 mg Subcutaneous Q24H   Continuous Infusions:    . sodium chloride 100 mL/hr at 09/05/11 2138  . heparin 1,900 Units/hr (09/06/11 0343)   PRN Meds:.acetaminophen, acetaminophen, HYDROmorphone (DILAUDID) injection, ondansetron (ZOFRAN) IV, ondansetron  Assessment/Plan:  Active Problems: 1. Dermatosis: Patient presented with an erythematous eruption, involving upper and lower extremities, complicated by vesiculation and pustulation. He denies any new medication or possible contact with contact allergens, such as poison ivy. Per spouse, when the rash initially appeared on 09/03/11, he utilized topical Hydrocortisone, without improvement. He was commenced on iv Vanc/Zosyn, now day#2, on suspicion of cellulitis. This combination will be adequate for secondary bacterial infection, which may have been introduced by excoriation, particularly, as patient did have a low grade pyrexia, was mildly tachycardic, and had a significant leukocytosis. Dr Druscilla Brownie, kindly provided dermatologic consultation, and has opined that this clinical picture is consistent with drug reaction versus vasculitis versus erythema multiforme. He took biopsies of lesional and perilesional skin of the right lower extremity for routine histology and direct immuno fluorescence, and has recommended systemic steroid treatment. HIV, RPR testing was non-reactive, hepatitis serology is negative for Hepatitis B and C.  2. HTN (hypertension): Sub-optimally controlled today. Currently on ARB. We shall adjust medications as indicated. 3. Right thigh abscess: Patient had a small abscess of right upper, inner thigh. I&D was performed by Dr Allyson Sabal, and revealed only bloody discharge. Bacterial culture and sensitivity obtained. this will be adequately covered by above antibiotics.  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 He has no discernible family history of VTE at a young age. We sent off hypercoagulable panel, commenced anticoagulation. Patient has no chest pain, dyspnea or hypoxia.  Comment: We shall discontinue telemetry, iv fluids, today.   LOS: 2 days   Monte Bronder,CHRISTOPHER 09/06/2011, 8:25 AM   Addendum: Have discussed pathology findings with Dr Allyson Sabal today. 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 No change in treatment recommended.

## 2011-09-06 NOTE — Progress Notes (Signed)
ANTICOAGULATION& ANTIBIOTIC CONSULT NOTE - Follow Up Consult  Pharmacy Consult for Lovenox, Coumadin, Vancomycin, Zosyn Indication: DVT, cellulitis/groin abscess  No Known Allergies  Patient Measurements: Height: 5\' 11"  (180.3 cm) Weight: 359 lb 1.6 oz (162.887 kg) IBW/kg (Calculated) : 75.3   Vital Signs: Temp: 98.3 F (36.8 C) (07/05 0551) Temp src: Oral (07/05 0551) BP: 161/102 mmHg (07/05 0551) Pulse Rate: 103  (07/05 0551)  Labs:  Basename 09/06/11 0700 09/05/11 2246 09/05/11 1559 09/04/11 2158  HGB 12.1* -- -- 13.3  HCT 34.8* -- -- 39.0  PLT 279 -- -- 318  APTT -- -- -- --  LABPROT 14.7 -- 14.6 --  INR 1.13 -- 1.12 --  HEPARINUNFRC 0.30 0.35 -- --  CREATININE 1.16 -- -- 1.24  CKTOTAL -- -- -- --  CKMB -- -- -- --  TROPONINI -- -- -- --    Estimated Creatinine Clearance: 130.7 ml/min (by C-G formula based on Cr of 1.16).   Assessment: 41 yo M presented with erythematous eruption, involving upper and lower extremities, complicated by vesiculation and pustulation 7/4.  Dermatology consulted and thinks consistent with drug reaction versus vasculitis versus erythema multiforme.  Biopsies of lesional and perilesional skin of the right LE were taken 7/4.  On empiric vancomycin and Zosyn day #2.  Renal function improving with estimated creatinine clearance >100 ml/min.  Blood cultures and groin abscess culture with NGTD.  Vancomycin trough above goal at 20.  Will decrease to q12hr dosing.  Patient also found to have bilateral LE DVT's, on day #2 of 5 day minimum overlap to Coumadin.  Heparin being transitioned to Lovenox today.  Heparin level therapeutic this AM.  INR remains sub-therapeutic which is expected.  No overt bleeding reported, H/H/plt slightly decreased.  Goal of Therapy:  INR 2-3 Monitor platelets by anticoagulation protocol: Yes Anti-Xa level 0.6-1.2 units/ml Vancomycin trough 10-15 mcg/ml   Plan:  1.  Discontinue heparin and begin Lovenox 160 mg SQ q12hr  (1 mg/kg q12hr) one hour after heparin discontinued (spoke with RN and aware) 2.  Change CBC to q72hr 3.  Repeat Coumadin 10 mg po x 1 tonight 4.  Continue daily PT/INR 5.  Continue Zosyn 3.375 gm IV q8hr, each dose over 4 hours 6.  Decrease Vancomycin to 1500 mg IV q12hr (spoke with RN, and has not hung 1400 dose, will start 12 hrs after last dose which is at 1600) 7.  F/up cultures, renal function, further vancomycin troughs as indicated   Edwyna Shell, Pharm.D., BCPS Clinical Pharmacist Pager: [phone] 09/06/2011,1:05 PM

## 2011-09-06 NOTE — Plan of Care (Signed)
Problem: Food- and Nutrition-Related Knowledge Deficit (NB-1.1) Goal: Nutrition education Formal process to instruct or train a patient/client in a skill or to impart knowledge to help patients/clients voluntarily manage or modify food choices and eating behavior to maintain or improve health.  Outcome: Completed/Met Date Met:  09/06/11 RD was consulted for education with pt. Pt states he is ready to make changes and lose weight. RD met with the pt and his wife, as well as several other family/friends in room. Pt went over 24 recall. 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   RD helped pt come up with breakfast and lunch ideas that would be less calories/fat and able to be eaten in his truck. RD encouraged smaller meals and high fiber snacks between to help with over eating. Reports sometimes he goes 6-8 hours between meals and then eats a very large meal. Pt also eats large meals at sit down restaurants often. RD helped pt find lower calorie options at these places as well.  RD encouraged pt to eliminate calorie containing beverages such as sweet tea and sodas. Recommended water, unsweet tea, or diet beverages.   RD provided pt with "weight loss tips" from the academy of [employer] dietetics as well and "nutrition in the fast lane" calorie guide for restaurants. Pt seemed willing to make changes at first, but the more we talked the less he wanted to change. Seemed reluctant to even try to change soda to diet or other calorie free choice, despite the fact that his wife is diabetic and keeps many of those types of beverages in the house. Many of the foods I recommended he stated "I don't eat that!" such as raw vegetables, wheat bread, or boiled eggs. Pt does have a very supportive wife who is knowledgeable in a diabetic diet, and I encouraged her to help him with his diet changes.   Pt was also questioning exercise. RD encouraged  pt to be cleared by his MD for activity prior to starting any activity, but provided general guidelines for 30 minutes moderate activity 3-5 days per week.   RD expects fair compliance. Likely needs continued reinforcement and would benefit from outpatient RD visits. RD provided pt with contact information in additional questions arise. No further nutrition needs at this time.   Body mass index is 50.08 kg/(m^2). Pt is morbidly obese.

## 2011-09-06 NOTE — Progress Notes (Signed)
Utilization Review Completed.Dorcas Carrow T7/07/2011

## 2011-09-07 LAB — COMPREHENSIVE METABOLIC PANEL
BUN: 17 mg/dL (ref 6–23)
Calcium: 8.5 mg/dL (ref 8.4–10.5)
GFR calc Af Amer: 80 mL/min — ABNORMAL LOW (ref >90)
Glucose, Bld: 208 mg/dL — ABNORMAL HIGH (ref 70–99)
Total Protein: 7.5 g/dL (ref 6.0–8.3)

## 2011-09-07 LAB — CBC
HCT: 34.7 % — ABNORMAL LOW (ref 39.0–52.0)
Hemoglobin: 11.8 g/dL — ABNORMAL LOW (ref 13.0–17.0)
MCH: 24.4 pg — ABNORMAL LOW (ref 26.0–34.0)
MCHC: 34 g/dL (ref 30.0–36.0)

## 2011-09-07 LAB — PROTIME-INR: Prothrombin Time: 15 seconds (ref 11.6–15.2)

## 2011-09-07 MED ORDER — WARFARIN SODIUM 7.5 MG PO TABS
15.0000 mg | ORAL_TABLET | Freq: Once | ORAL | Status: AC
  Administered 2011-09-07: 15 mg via ORAL
  Filled 2011-09-07: qty 2

## 2011-09-07 MED ORDER — AMLODIPINE BESYLATE 10 MG PO TABS
10.0000 mg | ORAL_TABLET | Freq: Every day | ORAL | Status: DC
  Administered 2011-09-07 – 2011-09-10 (×4): 10 mg via ORAL
  Filled 2011-09-07 (×4): qty 1

## 2011-09-07 MED ORDER — DOXYCYCLINE HYCLATE 100 MG PO TABS
100.0000 mg | ORAL_TABLET | Freq: Two times a day (BID) | ORAL | Status: DC
  Administered 2011-09-07 – 2011-09-10 (×7): 100 mg via ORAL
  Filled 2011-09-07 (×8): qty 1

## 2011-09-07 NOTE — Progress Notes (Signed)
ANTICOAGULATION CONSULT NOTE - Follow Up Consult  Pharmacy Consult for Lovenox and Coumadin  Indication: DVT  No Known Allergies  Patient Measurements: Height: 5\' 11"  (180.3 cm) Weight: 359 lb 1.7 oz (162.89 kg) IBW/kg (Calculated) : 75.3   Vital Signs: Temp: 97.7 F (36.5 C) (07/06 0439) Temp src: Oral (07/06 0439) BP: 146/78 mmHg (07/06 0439) Pulse Rate: 98  (07/06 0439)  Labs:  Basename 09/07/11 0635 09/06/11 0700 09/05/11 2246 09/05/11 1559 09/04/11 2158  HGB 11.8* 12.1* -- -- --  HCT 34.7* 34.8* -- -- 39.0  PLT 314 279 -- -- 318  APTT -- -- -- -- --  LABPROT 15.0 14.7 -- 14.6 --  INR 1.16 1.13 -- 1.12 --  HEPARINUNFRC -- 0.30 0.35 -- --  CREATININE 1.26 1.16 -- -- 1.24  CKTOTAL -- -- -- -- --  CKMB -- -- -- -- --  TROPONINI -- -- -- -- --    Estimated Creatinine Clearance: 120.4 ml/min (by C-G formula based on Cr of 1.26).  Assessment: 41 yo M found to have B/L DVTs on day 3 of 5 day minimum overlap of Lovenox to Coumadin. Renal function and CBC are stable. No bleeding is noted.  INR is below goal and unchanged despite 2 doses of Coumadin. No interactions noted.  Goal of Therapy:  INR 2-3 Anti-Xa level 0.6-1.2 units/ml 4hrs after LMWH dose given Monitor platelets by anticoagulation protocol: Yes   Plan:  1. Increase Coumadin to 15 mg po tonight 2. Continue Lovenox 160 mg sq q12h 3. INR daily, CBC q72h  [facility], [address].D., BCPS Clinical Pharmacist Pager: [phone] 09/07/2011 8:00 AM

## 2011-09-07 NOTE — Progress Notes (Addendum)
Subjective: Continues to improve.  Objective: Vital signs in last 24 hours: Temp:  [97.4 F (36.3 C)-98.3 F (36.8 C)] 97.7 F (36.5 C) (07/06 0439) Pulse Rate:  [71-123] 98  (07/06 0439) Resp:  [18-22] 18  (07/06 0439) BP: (136-164)/(78-111) 146/78 mmHg (07/06 0439) SpO2:  [95 %-98 %] 98 % (07/06 0439) Weight:  [162.89 kg (359 lb 1.7 oz)] 162.89 kg (359 lb 1.7 oz) (07/06 0500) Weight change: 0.003 kg (0.1 oz) Last BM Date: 09/06/11  Intake/Output from previous day: 07/05 0701 - 07/06 0700 In: 2987.5 [P.O.:455; I.V.:2532.5] Out: -      Physical Exam: General: Comfortable, alert, communicative, fully oriented, not short of breath at rest.  HEENT:  No clinical pallor, no jaundice, no conjunctival injection or discharge. NECK:  Supple, JVP not seen, no carotid bruits, no palpable lymphadenopathy, no palpable goiter. CHEST:  Clinically clear to auscultation, no wheezes, no crackles. HEART:  Sounds 1 and 2 heard, normal, regular, no murmurs. ABDOMEN:  Obese, soft, non-tender, no palpable organomegaly, no palpable masses, normal bowel sounds. GENITALIA:  Not examined. LOWER EXTREMITIES:  No pitting edema, palpable peripheral pulses. Rash (See below). Drained abscess of right inner thigh has resolved, and is without induration. Only minimal sanguinous exudate..  MUSCULOSKELETAL SYSTEM:  Unremarkable. CENTRAL NERVOUS SYSTEM:  No focal neurologic deficit on gross examination. SKIN: Patient had a raised erythematous rash involving bilateral LE, from dorsum of feet to both thighs, with vesiculation and pustulation. Also involvement of both UE, although far more sparse in distribution. There is sparing of trunk and face. Today, the eruptions have dramatically improved, practically resolved on upper extremities, no further pustulation on lower extremities.   Lab Results:  Basename 09/07/11 0635 09/06/11 0700  WBC 29.0* 18.7*  HGB 11.8* 12.1*  HCT 34.7* 34.8*  PLT 314 279    Basename  09/07/11 0635 09/06/11 0700  NA 138 138  K 4.0 3.9  CL 106 103  CO2 22 22  GLUCOSE 208* 160*  BUN 17 16  CREATININE 1.26 1.16  CALCIUM 8.5 8.6   Recent Results (from the past 240 hour(s))  CULTURE, BLOOD (ROUTINE X 2)     Status: Normal (Preliminary result)   Collection Time   09/05/11  2:40 AM      Component Value Range Status Comment   Specimen Description BLOOD RIGHT ARM   Final    Special Requests BOTTLES DRAWN AEROBIC AND ANAEROBIC 3CC EACH   Final    Culture  Setup Time 09/05/2011 16:55   Final    Culture     Final    Value:        BLOOD CULTURE RECEIVED NO GROWTH TO DATE CULTURE WILL BE HELD FOR 5 DAYS BEFORE ISSUING A FINAL NEGATIVE REPORT   Report Status PENDING   Incomplete   CULTURE, BLOOD (ROUTINE X 2)     Status: Normal (Preliminary result)   Collection Time   09/05/11  2:45 AM      Component Value Range Status Comment   Specimen Description BLOOD RIGHT HAND   Final    Special Requests BOTTLES DRAWN AEROBIC ONLY 2CC   Final    Culture  Setup Time 09/05/2011 16:55   Final    Culture     Final    Value:        BLOOD CULTURE RECEIVED NO GROWTH TO DATE CULTURE WILL BE HELD FOR 5 DAYS BEFORE ISSUING A FINAL NEGATIVE REPORT   Report Status PENDING   Incomplete  CULTURE, ROUTINE-ABSCESS     Status: Normal (Preliminary result)   Collection Time   09/05/11  4:12 PM      Component Value Range Status Comment   Specimen Description ABSCESS GROIN   Final    Special Requests NONE   Final    Gram Stain     Final    Value: RARE WBC PRESENT,BOTH PMN AND MONONUCLEAR     NO SQUAMOUS EPITHELIAL CELLS SEEN     NO ORGANISMS SEEN   Culture NO GROWTH 2 DAYS   Final    Report Status PENDING   Incomplete      Studies/Results: No results found.  Medications: Scheduled Meds:    . amLODipine  5 mg Oral Daily  . enoxaparin (LOVENOX) injection  160 mg Subcutaneous Q12H  . losartan  100 mg Oral Daily  . methylPREDNISolone (SOLU-MEDROL) injection  80 mg Intravenous Q8H  .  piperacillin-tazobactam (ZOSYN)  IV  3.375 g Intravenous Q8H  . sodium chloride  10 mL Intravenous Q12H  . sodium chloride  3 mL Intravenous Q12H  . vancomycin  1,500 mg Intravenous Q12H  . warfarin  10 mg Oral ONCE-1800  . warfarin  15 mg Oral ONCE-1800  . Warfarin - Pharmacist Dosing Inpatient   Does not apply q1800  . DISCONTD: vancomycin  1,500 mg Intravenous Q8H   Continuous Infusions:   PRN Meds:.acetaminophen, acetaminophen, HYDROmorphone (DILAUDID) injection, ondansetron (ZOFRAN) IV, ondansetron  Assessment/Plan:  Active Problems: 1. Dermatosis: Patient presented with an erythematous eruption, involving upper and lower extremities, complicated by vesiculation and pustulation. He denies any new medication or possible contact with contact allergens, such as poison ivy. Per spouse, when the rash initially appeared on 09/03/11, he utilized topical Hydrocortisone, without improvement. He was commenced on iv Vanc/Zosyn, now day#3, on suspicion of cellulitis. This combination will be adequate for secondary bacterial infection, which may have been introduced by excoriation, particularly, as patient did have a low grade pyrexia, was mildly tachycardic, and had a significant leukocytosis. Dr Druscilla Brownie, kindly provided dermatologic consultation, and opined that this clinical picture is consistent with drug reaction versus vasculitis versus erythema multiforme. He took biopsies of lesional and perilesional skin of the right lower extremity for routine histology and direct immunofluorescence, and recommended systemic steroid treatment. 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 No change in treatment recommended. HIV, RPR testing was non-reactive, hepatitis serology is negative for Hepatitis B and C. ANA is negative. Anti-DS DNA has been ordered. Patient will follow up with Dr Allyson Sabal on discharge, with oral Prednisone taper.  2. HTN  (hypertension): Sub-optimally controlled today. Currently on ARB. Norvasc has been added. We shall increase dose to 10 mg daily.  3. Right thigh abscess: Patient had a small abscess of right upper, inner thigh. I&D was performed by Dr Allyson Sabal, and revealed only bloody discharge. Bacterial culture and sensitivity obtained. this will be adequately covered by above antibiotics. Cultures are negative so far, ie, blood and abscess cultures. Will change antibiotic to Doxycycline and discontinue contact isolation. 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 He has no discernible family history of VTE at a young age. Patient has no chest pain, dyspnea or hypoxia. 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 Will consult hematology.    LOS: 3 days   Parker West,Parker West 09/07/2011, 9:31 AM  Addendum: Have discussed patient with Dr Murriel Hopper, hematologist/oncologist. He has recommended continue current anticoagulation and have patient follow up with him in one month.

## 2011-09-08 LAB — BASIC METABOLIC PANEL
CO2: 25 mEq/L (ref 19–32)
Calcium: 8.5 mg/dL (ref 8.4–10.5)
Chloride: 106 mEq/L (ref 96–112)
Sodium: 141 mEq/L (ref 135–145)

## 2011-09-08 LAB — CBC
Platelets: 312 10*3/uL (ref 150–400)
RBC: 5.03 MIL/uL (ref 4.22–5.81)
WBC: 25 10*3/uL — ABNORMAL HIGH (ref 4.0–10.5)

## 2011-09-08 LAB — PROTIME-INR: Prothrombin Time: 16.3 seconds — ABNORMAL HIGH (ref 11.6–15.2)

## 2011-09-08 MED ORDER — PREDNISONE 50 MG PO TABS
80.0000 mg | ORAL_TABLET | Freq: Every day | ORAL | Status: DC
  Administered 2011-09-08 – 2011-09-09 (×2): 80 mg via ORAL
  Filled 2011-09-08 (×3): qty 1

## 2011-09-08 MED ORDER — PANTOPRAZOLE SODIUM 40 MG PO TBEC
40.0000 mg | DELAYED_RELEASE_TABLET | Freq: Every day | ORAL | Status: DC
  Administered 2011-09-08 – 2011-09-09 (×2): 40 mg via ORAL
  Filled 2011-09-08: qty 1

## 2011-09-08 MED ORDER — WARFARIN SODIUM 7.5 MG PO TABS
15.0000 mg | ORAL_TABLET | Freq: Once | ORAL | Status: AC
  Administered 2011-09-08: 15 mg via ORAL
  Filled 2011-09-08: qty 2

## 2011-09-08 NOTE — Progress Notes (Signed)
ANTICOAGULATION CONSULT NOTE - Follow Up Consult  Pharmacy Consult for Lovenox and Coumadin  Indication: DVT  No Known Allergies  Patient Measurements: Height: 5\' 11"  (180.3 cm) Weight: 370 lb 2.4 oz (167.9 kg) IBW/kg (Calculated) : 75.3   Vital Signs: Temp: 98.2 F (36.8 C) (07/07 0648) Temp src: Oral (07/07 0648) BP: 137/82 mmHg (07/07 0648) Pulse Rate: 87  (07/07 0648)  Labs:  Basename 09/08/11 0704 09/07/11 0635 09/06/11 0700 09/05/11 2246  HGB 12.0* 11.8* -- --  HCT 36.1* 34.7* 34.8* --  PLT 312 314 279 --  APTT -- -- -- --  LABPROT 16.3* 15.0 14.7 --  INR 1.29 1.16 1.13 --  HEPARINUNFRC -- -- 0.30 0.35  CREATININE 1.45* 1.26 1.16 --  CKTOTAL -- -- -- --  CKMB -- -- -- --  TROPONINI -- -- -- --    Estimated Creatinine Clearance: 106.5 ml/min (by C-G formula based on Cr of 1.45).  Assessment: 41 yo M found to have B/L DVTs on day 4 of 5 day minimum overlap of Lovenox to Coumadin. SCr bumped up slightly. CBC is stable. No bleeding is noted.  INR remains below goal. Will watch for increased sensitivity with doxycycline added.  Goal of Therapy:  INR 2-3 Anti-Xa level 0.6-1.2 units/ml 4hrs after LMWH dose given Monitor platelets by anticoagulation protocol: Yes   Plan:  1. Increase Coumadin to 15 mg po tonight 2. Continue Lovenox 160 mg sq q12h 3. INR daily, CBC q72h  [facility], [address].D., BCPS Clinical Pharmacist Pager: [phone] 09/08/2011 1:45 PM

## 2011-09-08 NOTE — Progress Notes (Signed)
Subjective: No new issues. Feels better.  Objective: Vital signs in last 24 hours: Temp:  [97.6 F (36.4 C)-98.2 F (36.8 C)] 98.2 F (36.8 C) (07/07 0648) Pulse Rate:  [87-108] 87  (07/07 0648) Resp:  [18] 18  (07/07 0648) BP: (132-144)/(81-88) 137/82 mmHg (07/07 0648) SpO2:  [94 %-98 %] 98 % (07/07 0648) Weight:  [167.9 kg (370 lb 2.4 oz)] 167.9 kg (370 lb 2.4 oz) (07/07 0648) Weight change: 5.01 kg (11 lb 0.7 oz) Last BM Date: 09/07/11  Intake/Output from previous day: 07/06 0701 - 07/07 0700 In: 720 [P.O.:720] Out: -      Physical Exam: General: Comfortable, alert, communicative, fully oriented, not short of breath at rest.  HEENT:  No clinical pallor, no jaundice, no conjunctival injection or discharge. NECK:  Supple, JVP not seen, no carotid bruits, no palpable lymphadenopathy, no palpable goiter. CHEST:  Clinically clear to auscultation, no wheezes, no crackles. HEART:  Sounds 1 and 2 heard, normal, regular, no murmurs. ABDOMEN:  Obese, soft, non-tender, no palpable organomegaly, no palpable masses, normal bowel sounds. GENITALIA:  Not examined. LOWER EXTREMITIES:  No pitting edema, palpable peripheral pulses. Rash (See below). Drained abscess of right inner thigh has resolved, and is without induration. Only minimal sanguinous exudate..  MUSCULOSKELETAL SYSTEM:  Unremarkable. CENTRAL NERVOUS SYSTEM:  No focal neurologic deficit on gross examination. SKIN: Patient had a raised erythematous rash involving bilateral LE, from dorsum of feet to both thighs, with vesiculation and pustulation. Also involvement of both UE, although far more sparse in distribution. There is sparing of trunk and face. The eruptions have dramatically improved, practically resolved on upper extremities, no further pustulation on lower extremities.   Lab Results:  [facility] 09/08/11 0704 09/07/11 0635  WBC 25.0* 29.0*  HGB 12.0* 11.8*  HCT 36.1* 34.7*  PLT 312 314    Basename 09/08/11 0704  09/07/11 0635  NA 141 138  K 3.8 4.0  CL 106 106  CO2 25 22  GLUCOSE 194* 208*  BUN 21 17  CREATININE 1.45* 1.26  CALCIUM 8.5 8.5   Recent Results (from the past 240 hour(s))  CULTURE, BLOOD (ROUTINE X 2)     Status: Normal (Preliminary result)   Collection Time   09/05/11  2:40 AM      Component Value Range Status Comment   Specimen Description BLOOD RIGHT ARM   Final    Special Requests BOTTLES DRAWN AEROBIC AND ANAEROBIC 3CC EACH   Final    Culture  Setup Time 09/05/2011 16:55   Final    Culture     Final    Value:        BLOOD CULTURE RECEIVED NO GROWTH TO DATE CULTURE WILL BE HELD FOR 5 DAYS BEFORE ISSUING A FINAL NEGATIVE REPORT   Report Status PENDING   Incomplete   CULTURE, BLOOD (ROUTINE X 2)     Status: Normal (Preliminary result)   Collection Time   09/05/11  2:45 AM      Component Value Range Status Comment   Specimen Description BLOOD RIGHT HAND   Final    Special Requests BOTTLES DRAWN AEROBIC ONLY 2CC   Final    Culture  Setup Time 09/05/2011 16:55   Final    Culture     Final    Value:        BLOOD CULTURE RECEIVED NO GROWTH TO DATE CULTURE WILL BE HELD FOR 5 DAYS BEFORE ISSUING A FINAL NEGATIVE REPORT   Report Status PENDING   Incomplete  CULTURE, ROUTINE-ABSCESS     Status: Normal (Preliminary result)   Collection Time   09/05/11  4:12 PM      Component Value Range Status Comment   Specimen Description ABSCESS GROIN   Final    Special Requests NONE   Final    Gram Stain     Final    Value: RARE WBC PRESENT,BOTH PMN AND MONONUCLEAR     NO SQUAMOUS EPITHELIAL CELLS SEEN     NO ORGANISMS SEEN   Culture Culture reincubated for better growth   Final    Report Status PENDING   Incomplete      Studies/Results: No results found.  Medications: Scheduled Meds:    . amLODipine  10 mg Oral Daily  . doxycycline  100 mg Oral Q12H  . enoxaparin (LOVENOX) injection  160 mg Subcutaneous Q12H  . losartan  100 mg Oral Daily  . pantoprazole  40 mg Oral Q0600  .  predniSONE  80 mg Oral Q breakfast  . sodium chloride  10 mL Intravenous Q12H  . sodium chloride  3 mL Intravenous Q12H  . warfarin  15 mg Oral ONCE-1800  . Warfarin - Pharmacist Dosing Inpatient   Does not apply q1800  . DISCONTD: amLODipine  5 mg Oral Daily  . DISCONTD: methylPREDNISolone (SOLU-MEDROL) injection  80 mg Intravenous Q8H  . DISCONTD: piperacillin-tazobactam (ZOSYN)  IV  3.375 g Intravenous Q8H  . DISCONTD: vancomycin  1,500 mg Intravenous Q12H   Continuous Infusions:   PRN Meds:.acetaminophen, acetaminophen, HYDROmorphone (DILAUDID) injection, ondansetron (ZOFRAN) IV, ondansetron  Assessment/Plan:  Active Problems: 1. Dermatosis: Patient presented with an erythematous eruption, involving upper and lower extremities, complicated by vesiculation and pustulation. He denies any new medication or possible contact with contact allergens, such as poison ivy. Per spouse, when the rash initially appeared on 09/03/11, he utilized topical Hydrocortisone, without improvement. He was initially commenced on iv Vanc/Zosyn, now day#3, on suspicion of cellulitis and continued for secondary bacterial infection, which may have been introduced by excoriation, particularly, as patient did have a low grade pyrexia, was mildly tachycardic, and had a significant leukocytosis. Dr Druscilla Brownie, kindly provided dermatologic consultation, and opined that this clinical picture is consistent with drug reaction versus vasculitis versus erythema multiforme. He took biopsies of lesional and perilesional skin of the right lower extremity for routine histology and direct immunofluorescence, and recommended systemic steroid treatment. 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 No change in treatment recommended. HIV, RPR testing was non-reactive, hepatitis serology is negative for Hepatitis B and C. ANA is negative. Anti-DS DNA has been ordered. Patient will follow  up with Dr Allyson Sabal on discharge, with oral Prednisone taper, which has been commenced today.  2. HTN (hypertension): Sub-optimally controlled initially. Currently on ARB. Norvasc has been added, with satisfactory control.  3. Right thigh abscess: Patient had a small abscess of right upper, inner thigh. I&D was performed by Dr Allyson Sabal, and revealed only bloody discharge. Bacterial culture and sensitivity obtained. this will be adequately covered by above antibiotics. Cultures are negative so far, ie, blood and abscess cultures. Antibiotic changed to Doxycycline on 09/07/11, and a further 7 days of treatment is plannedand discontinue contact isolation. 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 He has no discernible family history of VTE at a young age. Patient has no chest pain, dyspnea or  hypoxia. Hypercoagulable panel confirmed Lupus anticoagulant. Patient will probably need life-long anticoagulation. Have discussed with Dr Murriel Hopper on 09/07/11, and he has kindly offered to have patient see him in clinic, in about one month. Pharmacy is managing anticoagulation.    LOS: 4 days   Jannatul West,Parker 09/08/2011, 9:08 AM

## 2011-09-09 LAB — PROTIME-INR
INR: 1.64 — ABNORMAL HIGH (ref 0.00–1.49)
Prothrombin Time: 19.7 seconds — ABNORMAL HIGH (ref 11.6–15.2)

## 2011-09-09 LAB — CBC
Hemoglobin: 11.6 g/dL — ABNORMAL LOW (ref 13.0–17.0)
MCHC: 33.8 g/dL (ref 30.0–36.0)
Platelets: 289 10*3/uL (ref 150–400)
RBC: 4.77 MIL/uL (ref 4.22–5.81)

## 2011-09-09 LAB — CULTURE, ROUTINE-ABSCESS

## 2011-09-09 MED ORDER — PREDNISONE 20 MG PO TABS
40.0000 mg | ORAL_TABLET | Freq: Every day | ORAL | Status: DC
  Administered 2011-09-10: 40 mg via ORAL
  Filled 2011-09-09 (×2): qty 2

## 2011-09-09 MED ORDER — HYDROCODONE-ACETAMINOPHEN 5-325 MG PO TABS
1.0000 | ORAL_TABLET | ORAL | Status: DC | PRN
  Administered 2011-09-09 – 2011-09-10 (×3): 1 via ORAL
  Filled 2011-09-09 (×3): qty 1

## 2011-09-09 MED ORDER — WARFARIN SODIUM 7.5 MG PO TABS
15.0000 mg | ORAL_TABLET | Freq: Once | ORAL | Status: AC
  Administered 2011-09-09: 15 mg via ORAL
  Filled 2011-09-09: qty 2

## 2011-09-09 NOTE — Progress Notes (Signed)
Inpatient Diabetes Program Recommendations  AACE/ADA: New Consensus Statement on Inpatient Glycemic Control (2009)  Target Ranges:  Prepandial:   less than 140 mg/dL      Peak postprandial:   less than 180 mg/dL (1-2 hours)      Critically ill patients:  140 - 180 mg/dL   Reason for Visit: Elevated lab glucose while on Solumedrol  Inpatient Diabetes Program Recommendations Correction (SSI): Please check cbg's and order correction tidwc while on high dose Prednisone. HgbA1C: Last HgbA1C in 2011 at 6.1%, which is "ati tisk for diabetes".  Recommend checking another on this admission. Diet: Would use carbohydrate modified whie on high dose Prednisone.  Note: Thank you, Lenor Coffin, RN, CNS, Diabetes Coordinator [phone])

## 2011-09-09 NOTE — Care Management Note (Signed)
    Page 1 of 2   09/10/2011     10:39:15 AM   CARE MANAGEMENT NOTE 09/10/2011  Patient:  Parker West, Parker West   Account Number:  [id number]  Date Initiated:  09/09/2011  Documentation initiated by:  Letha Cape  Subjective/Objective Assessment:   dx bil dvt  admit- lives with girlfriend.  pta independent.     Action/Plan:   f/u for pt/inr at pcp office wed, thur and fri   Anticipated DC Date:  09/10/2011   Anticipated DC Plan:  HOME/SELF CARE      DC Planning Services  CM consult      Choice offered to / List presented to:             Status of service:  Completed, signed off Medicare Important Message given?   (If response is "NO", the following Medicare IM given date fields will be blank) Date Medicare IM given:   Date Additional Medicare IM given:    Discharge Disposition:  HOME/SELF CARE  Per UR Regulation:  Reviewed for med. necessity/level of care/duration of stay  If discussed at Long Length of Stay Meetings, dates discussed:   09/10/2011    Comments:  PCP Catha Brow  09/10/11 10:23 Letha Cape RN, BSN [phone] patient for dc today, patient will have 3 more days of lovenox,  he will go to pcp office Wed, Thur and Friday for blood draw.  Patient will give himself injections with his girlfriends help.  Patient goes to [facility] on [address], their fax number is [phone], and their phone number is [phone].   I will fax script over when I receive it from MD to see how much it will cost patient.  NCM will continue to follow for other dc needs.  09/09/11 17:55 Letha Cape RN, BSN [phone] patient lives with girlfriend, pta independent.  Patient's inr is not therapeutic today, pateint for possible dc tomorrow.

## 2011-09-09 NOTE — Progress Notes (Signed)
ANTICOAGULATION CONSULT NOTE - Follow Up Consult  Pharmacy Consult for Lovenox and Coumadin  Indication: DVT  No Known Allergies  Patient Measurements: Height: 5\' 11"  (180.3 cm) Weight: 374 lb 9 oz (169.9 kg) IBW/kg (Calculated) : 75.3   Vital Signs: Temp: 98.2 F (36.8 C) (07/08 1427) Temp src: Oral (07/08 1427) BP: 127/75 mmHg (07/08 1427) Pulse Rate: 93  (07/08 1427)  Labs:  Basename 09/09/11 0610 09/08/11 0704 09/07/11 0635  HGB 11.6* 12.0* --  HCT 34.3* 36.1* 34.7*  PLT 289 312 314  APTT -- -- --  LABPROT 19.7* 16.3* 15.0  INR 1.64* 1.29 1.16  HEPARINUNFRC -- -- --  CREATININE -- 1.45* 1.26  CKTOTAL -- -- --  CKMB -- -- --  TROPONINI -- -- --    Estimated Creatinine Clearance: 107.3 ml/min (by C-G formula based on Cr of 1.45).  Assessment: 41 yo M found to have B/L DVTs on day 5 of 5 day minimum overlap of Lovenox to Coumadin- now will need continued overlap until INR therapeutic. No bleeding is noted.  INR remains below goal but finally moving past 24 hours. Pt continues on doxycycline which may increase sensitivity to coumadin.  Goal of Therapy:  INR 2-3 Anti-Xa level 0.6-1.2 units/ml 4hrs after LMWH dose given Monitor platelets by anticoagulation protocol: Yes   Plan:  1. Continue Coumadin 15 mg po tonight 2. Continue Lovenox 160 mg sq q12h 3. INR daily, CBC q72h  Christoper Fabian, PharmD, BCPS Clinical pharmacist, pager [phone] 09/09/2011 2:36 PM

## 2011-09-09 NOTE — Progress Notes (Addendum)
Subjective: No new issues.  Objective: Vital signs in last 24 hours: Temp:  [97.5 F (36.4 C)-98.5 F (36.9 C)] 98.2 F (36.8 C) (07/08 1427) Pulse Rate:  [84-99] 93  (07/08 1427) Resp:  [17-20] 20  (07/08 1427) BP: (108-145)/(66-89) 127/75 mmHg (07/08 1427) SpO2:  [94 %-100 %] 100 % (07/08 1427) Weight:  [169.9 kg (374 lb 9 oz)] 169.9 kg (374 lb 9 oz) (07/08 0527) Weight change: 2 kg (4 lb 6.5 oz) Last BM Date: 09/09/11  Intake/Output from previous day: 07/07 0701 - 07/08 0700 In: 480 [P.O.:480] Out: -  Total I/O In: 480 [P.O.:480] Out: -    Physical Exam: General: Comfortable, alert, communicative, fully oriented, not short of breath at rest.  HEENT:  No clinical pallor, no jaundice, no conjunctival injection or discharge. NECK:  Supple, JVP not seen, no carotid bruits, no palpable lymphadenopathy, no palpable goiter. CHEST:  Clinically clear to auscultation, no wheezes, no crackles. HEART:  Sounds 1 and 2 heard, normal, regular, no murmurs. ABDOMEN:  Obese, soft, non-tender, no palpable organomegaly, no palpable masses, normal bowel sounds. GENITALIA:  Not examined. LOWER EXTREMITIES:  No pitting edema, palpable peripheral pulses. Rash (See below). Drained abscess of right inner thigh has resolved, and is without induration. Only minimal sanguinous exudate..  MUSCULOSKELETAL SYSTEM:  Unremarkable. CENTRAL NERVOUS SYSTEM:  No focal neurologic deficit on gross examination. SKIN: Patient had a raised erythematous rash involving bilateral LE, from dorsum of feet to both thighs, with vesiculation and pustulation. Also involvement of both UE, although far more sparse in distribution. There is sparing of trunk and face. The eruptions have dramatically improved, practically resolved on upper extremities, no further pustulation on lower extremities, lesions clearly drying up and healing.   Lab Results:  [facility] 09/09/11 0610 09/08/11 0704  WBC 25.2* 25.0*  HGB 11.6* 12.0*  HCT  34.3* 36.1*  PLT 289 312    Basename 09/08/11 0704 09/07/11 0635  NA 141 138  K 3.8 4.0  CL 106 106  CO2 25 22  GLUCOSE 194* 208*  BUN 21 17  CREATININE 1.45* 1.26  CALCIUM 8.5 8.5   Recent Results (from the past 240 hour(s))  CULTURE, BLOOD (ROUTINE X 2)     Status: Normal (Preliminary result)   Collection Time   09/05/11  2:40 AM      Component Value Range Status Comment   Specimen Description BLOOD RIGHT ARM   Final    Special Requests BOTTLES DRAWN AEROBIC AND ANAEROBIC 3CC EACH   Final    Culture  Setup Time 09/05/2011 16:55   Final    Culture     Final    Value:        BLOOD CULTURE RECEIVED NO GROWTH TO DATE CULTURE WILL BE HELD FOR 5 DAYS BEFORE ISSUING A FINAL NEGATIVE REPORT   Report Status PENDING   Incomplete   CULTURE, BLOOD (ROUTINE X 2)     Status: Normal (Preliminary result)   Collection Time   09/05/11  2:45 AM      Component Value Range Status Comment   Specimen Description BLOOD RIGHT HAND   Final    Special Requests BOTTLES DRAWN AEROBIC ONLY 2CC   Final    Culture  Setup Time 09/05/2011 16:55   Final    Culture     Final    Value:        BLOOD CULTURE RECEIVED NO GROWTH TO DATE CULTURE WILL BE HELD FOR 5 DAYS BEFORE ISSUING A FINAL  NEGATIVE REPORT   Report Status PENDING   Incomplete   CULTURE, ROUTINE-ABSCESS     Status: Normal   Collection Time   09/05/11  4:12 PM      Component Value Range Status Comment   Specimen Description ABSCESS GROIN   Final    Special Requests NONE   Final    Gram Stain     Final    Value: RARE WBC PRESENT,BOTH PMN AND MONONUCLEAR     NO SQUAMOUS EPITHELIAL CELLS SEEN     NO ORGANISMS SEEN   Culture     Final    Value: MULTIPLE ORGANISMS PRESENT, NONE PREDOMINANT     Note: NO STAPHYLOCOCCUS AUREUS ISOLATED NO GROUP A STREP (S.PYOGENES) ISOLATED   Report Status 09/09/2011 FINAL   Final      Studies/Results: No results found.  Medications: Scheduled Meds:    . amLODipine  10 mg Oral Daily  . doxycycline  100 mg Oral  Q12H  . enoxaparin (LOVENOX) injection  160 mg Subcutaneous Q12H  . losartan  100 mg Oral Daily  . pantoprazole  40 mg Oral Q0600  . predniSONE  80 mg Oral Q breakfast  . sodium chloride  10 mL Intravenous Q12H  . sodium chloride  3 mL Intravenous Q12H  . warfarin  15 mg Oral ONCE-1800  . Warfarin - Pharmacist Dosing Inpatient   Does not apply q1800   Continuous Infusions:   PRN Meds:.acetaminophen, acetaminophen, HYDROmorphone (DILAUDID) injection, ondansetron (ZOFRAN) IV, ondansetron  Assessment/Plan:  Active Problems: 1. Dermatosis: Patient presented with an erythematous eruption, involving upper and lower extremities, complicated by vesiculation and pustulation. He denies any new medication or possible contact with contact allergens, such as poison ivy. Per spouse, when the rash initially appeared on 09/03/11, he utilized topical Hydrocortisone, without improvement. He was initially commenced on iv Vanc/Zosyn, on suspicion of cellulitis and continued for secondary bacterial infection, which may have been introduced by excoriation, particularly, as patient did have a low grade pyrexia, was mildly tachycardic, and had a significant leukocytosis. Dr Druscilla Brownie, kindly provided dermatologic consultation, and opined that this clinical picture is consistent with drug reaction versus vasculitis versus erythema multiforme. He took biopsies of lesional and perilesional skin of the right lower extremity for routine histology and direct immunofluorescence, and recommended systemic steroid treatment. 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 No change in treatment recommended. HIV, RPR testing was non-reactive, hepatitis serology is negative for Hepatitis B and C. ANA is negative. Anti-DS DNA has been ordered. Patient will follow up with Dr Allyson Sabal on discharge, with oral Prednisone taper, which was been commenced on 09/08/11.  2. HTN (hypertension):  Sub-optimally controlled initially. Currently on ARB. Norvasc has been added, with satisfactory control.  3. Right thigh abscess: Patient had a small abscess of right upper, inner thigh. I&D was performed by Dr Allyson Sabal, and revealed only bloody discharge. Bacterial culture and sensitivity obtained. Cultures are negative so far, ie, blood and abscess cultures. Antibiotic changed to Doxycycline on 09/07/11, and a further 7 days of treatment is planned, to be concluded on 09/14/11. 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 He has no discernible family history of VTE at a young age. Patient has no chest pain, dyspnea or hypoxia. Hypercoagulable panel confirmed Lupus anticoagulant. Patient will probably need life-long anticoagulation. Have discussed with Dr Jeneen Rinks  [place] on 09/07/11, and he has kindly offered to have patient see him in clinic, in about one month. AntiDsDNA was negative. Pharmacy is managing anticoagulation, and INR is 1.64 today.   Comment: Aiming possible discharge on 09/10/11, with HHRN.   LOS: 5 days   Roya Gieselman,CHRISTOPHER 09/09/2011, 2:40 PM

## 2011-09-10 LAB — FACTOR 5 LEIDEN

## 2011-09-10 LAB — CBC
Hemoglobin: 11.5 g/dL — ABNORMAL LOW (ref 13.0–17.0)
MCHC: 33.2 g/dL (ref 30.0–36.0)
Platelets: 238 10*3/uL (ref 150–400)
RDW: 16.1 % — ABNORMAL HIGH (ref 11.5–15.5)

## 2011-09-10 LAB — PROTIME-INR
INR: 1.83 — ABNORMAL HIGH (ref 0.00–1.49)
Prothrombin Time: 21.5 seconds — ABNORMAL HIGH (ref 11.6–15.2)

## 2011-09-10 MED ORDER — AMLODIPINE BESYLATE 10 MG PO TABS: 10.0000 mg | ORAL_TABLET | Freq: Every day | ORAL | Status: DC

## 2011-09-10 MED ORDER — PREDNISONE 10 MG PO TABS: ORAL_TABLET | ORAL | Status: DC

## 2011-09-10 MED ORDER — ENOXAPARIN SODIUM 80 MG/0.8ML ~~LOC~~ SOLN: 160.0000 mg | Freq: Two times a day (BID) | SUBCUTANEOUS | Status: DC

## 2011-09-10 MED ORDER — DOXYCYCLINE HYCLATE 100 MG PO TABS: 100.0000 mg | ORAL_TABLET | Freq: Two times a day (BID) | ORAL | Status: AC

## 2011-09-10 MED ORDER — WARFARIN SODIUM 7.5 MG PO TABS: 15.0000 mg | ORAL_TABLET | Freq: Once | ORAL | Status: DC

## 2011-09-10 MED ORDER — WARFARIN SODIUM 7.5 MG PO TABS
15.0000 mg | ORAL_TABLET | Freq: Once | ORAL | Status: DC
  Filled 2011-09-10: qty 2

## 2011-09-10 MED ORDER — HYDROCODONE-ACETAMINOPHEN 5-325 MG PO TABS: 1.0000 | ORAL_TABLET | ORAL | Status: DC | PRN

## 2011-09-10 NOTE — Progress Notes (Signed)
Parker West to be D/C'd Home per MD order.  Discussed with the patient and all questions fully answered.   Loc, Feinstein  Home Medication Instructions VHQ:[id number]   Printed on:09/10/11 1242  Medication Information                    losartan (COZAAR) 100 MG tablet Take 100 mg by mouth daily.           amLODipine (NORVASC) 10 MG tablet Take 1 tablet (10 mg total) by mouth daily.           doxycycline (VIBRA-TABS) 100 MG tablet Take 1 tablet (100 mg total) by mouth every 12 (twelve) hours.           enoxaparin (LOVENOX) 80 MG/0.8ML injection Inject 1.6 mLs (160 mg total) into the skin every 12 (twelve) hours.           HYDROcodone-acetaminophen (NORCO) 5-325 MG per tablet Take 1 tablet by mouth every 4 (four) hours as needed.           predniSONE (DELTASONE) 10 MG tablet Take 40 mg daily for 3 days, then 30 mg daily for 3 days, then 20 mg daily for 3 days, then 10 mg daily for 3 days, then stop.           warfarin (COUMADIN) 7.5 MG tablet Take 2 tablets (15 mg total) by mouth one time only at 6 PM.             VVS, Skin clean, dry and intact without evidence of skin break down, no evidence of skin tears noted. IV catheter discontinued intact. Site without signs and symptoms of complications. Dressing and pressure applied.  An After Visit Summary was printed and given to the patient. Patient escorted via WC, and D/C home via private auto.  Kennyth Arnold D 09/10/2011 12:42 PM

## 2011-09-10 NOTE — Progress Notes (Signed)
ANTICOAGULATION CONSULT NOTE - Follow Up Consult  Pharmacy Consult for Lovenox and Coumadin  Indication: Bilateral DVT  No Known Allergies  Patient Measurements: Height: 5\' 11"  (180.3 cm) Weight: 374 lb 11.2 oz (169.963 kg) IBW/kg (Calculated) : 75.3   Vital Signs: Temp: 97.4 F (36.3 C) (07/09 0512) Temp src: Oral (07/09 0512) BP: 153/112 mmHg (07/09 0512) Pulse Rate: 83  (07/09 0512)  Labs:  Basename 09/10/11 0520 09/09/11 0610 09/08/11 0704  HGB 11.5* 11.6* --  HCT 34.6* 34.3* 36.1*  PLT 238 289 312  APTT -- -- --  LABPROT 21.5* 19.7* 16.3*  INR 1.83* 1.64* 1.29  HEPARINUNFRC -- -- --  CREATININE -- -- 1.45*  CKTOTAL -- -- --  CKMB -- -- --  TROPONINI -- -- --    Estimated Creatinine Clearance: 107.3 ml/min (by C-G formula based on Cr of 1.45).  Assessment: 41 y.o M on lovenox + warfarin for B/L DVTs. The patient has a SUBtherapeutic INR this a.m though trending up (INR 1.83 << 1.64, goal of 2-3). Today is VTE overlap D#6/5 minimum overlap recommended per CHEST guidelines however best practice is to continue overlap until INR therapeutic x 24 hours. Hgb/Hct/Plt stable. No s/sx of bleeding noted. The patient continues on doxycycline which can increase warfarin sensitivity.  Warfarin and Lovenox Discharge Recommendations: It is noted that the patient may be discharged home today. Upon discharge would recommend to continue Lovenox 160 mg SQ every 12 hours (would need an Rx for 120 mg syringe and 40 mg syringes to make dose of 160 mg). Would also recommend warfarin 15 mg x 1 dose today followed by 12.5 mg daily and to have his PT/INR rechecked by Friday, July 12th.   Goal of Therapy:  INR 2-3 Anti-Xa level 0.6-1.2 units/ml 4hrs after LMWH dose given Monitor platelets by anticoagulation protocol: Yes   Plan:  1. Warfarin 15 mg x 1 dose today 2. Continue Lovenox 160 mg SQ every 12 hours 3. INR daily, CBC q72h  Georgina Pillion, PharmD, BCPS Clinical  Pharmacist Pager: [phone] 09/10/2011 10:38 AM

## 2011-09-10 NOTE — Discharge Summary (Signed)
Physician Discharge Summary  Patient ID: Parker West MRN: [id number] DOB/AGE: 1970/06/19 41 y.o.  Admit date: 09/04/2011 Discharge date: 09/10/2011  Primary Care Physician:  Garnet Koyanagi, DO   Discharge Diagnoses:    Patient Active Problem List  Diagnosis  . HYPERTENSION  . ERECTILE DYSFUNCTION, ORGANIC  . ABSCESS, SKIN  . Cellulitis of multiple sites of lower extremity  . HTN (hypertension)  . Dermatosis  . DVT (deep venous thrombosis)    Medication List  As of 09/10/2011 10:55 AM   STOP taking these medications         GOODY HEADACHE PO      naproxen sodium 220 MG tablet      OVER THE COUNTER MEDICATION         TAKE these medications         amLODipine 10 MG tablet   Commonly known as: NORVASC   Take 1 tablet (10 mg total) by mouth daily.      doxycycline 100 MG tablet   Commonly known as: VIBRA-TABS   Take 1 tablet (100 mg total) by mouth every 12 (twelve) hours.      enoxaparin 80 MG/0.8ML injection   Commonly known as: LOVENOX   Inject 1.6 mLs (160 mg total) into the skin every 12 (twelve) hours.      HYDROcodone-acetaminophen 5-325 MG per tablet   Commonly known as: NORCO   Take 1 tablet by mouth every 4 (four) hours as needed.      losartan 100 MG tablet   Commonly known as: COZAAR   Take 100 mg by mouth daily.      predniSONE 10 MG tablet   Commonly known as: DELTASONE   Take 40 mg daily for 3 days, then 30 mg daily for 3 days, then 20 mg daily for 3 days, then 10 mg daily for 3 days, then stop.      warfarin 7.5 MG tablet   Commonly known as: COUMADIN   Take 2 tablets (15 mg total) by mouth one time only at 6 PM.             Disposition and Follow-up:  Follow up with primary MD and with Dr Beryle Beams.  Consults:  Telephone discussion. Dr Murriel Hopper, hematologist/oncologist.  Significant Diagnostic Studies:  No results found.  Brief H and P: For complete details, refer to admission H and P. However, in brief, this is a 41  year-old male, with hypertension and morbid obesity, presenting with swelling pain and erythema of both lower extremities with some vesiculation and pustulation of 2 days duration days, which appeared to be progressive. In the ED,  patient was found to have leukocytosis and was mildly febrile. He was admitted for further evaluation, investigation and management.  Physical Exam: On 09/10/11.  General: Comfortable, alert, communicative, fully oriented, not short of breath at rest.  HEENT: No clinical pallor, no jaundice, no conjunctival injection or discharge.  NECK: Supple, JVP not seen, no carotid bruits, no palpable lymphadenopathy, no palpable goiter.  CHEST: Clinically clear to auscultation, no wheezes, no crackles.  HEART: Sounds 1 and 2 heard, normal, regular, no murmurs.  ABDOMEN: Obese, soft, non-tender, no palpable organomegaly, no palpable masses, normal bowel sounds.  GENITALIA: Not examined.  LOWER EXTREMITIES: No pitting edema, palpable peripheral pulses. Rash (See below). Drained abscess of right inner thigh has resolved, and is without induration. Only minimal sanguinous exudate..  MUSCULOSKELETAL SYSTEM: Unremarkable.  CENTRAL NERVOUS SYSTEM: No focal neurologic deficit on gross examination.  SKIN: Patient had a raised erythematous rash involving bilateral LE, from dorsum of feet to both thighs, with vesiculation and pustulation. Also involvement of both UE, although far more sparse in distribution. There is sparing of trunk and face. The eruptions have dramatically improved, practically resolved on upper extremities, no further pustulation on lower extremities, lesions clearly drying up and healing.    Hospital Course:  Active Problems:  1. Dermatosis/Leukocytoplastic Vasculitis: Patient presented with an erythematous eruption, involving upper and lower extremities, complicated by vesiculation and pustulation. He denies any new medication or possible contact with contact allergens,  such as poison ivy. Per spouse, when the rash initially appeared on 09/03/11, he utilized topical Hydrocortisone, without improvement. He was initially commenced on iv Vanc/Zosyn, on suspicion of cellulitis and continued for secondary bacterial infection, which may have been introduced by excoriation, particularly, as patient did have a low grade pyrexia, was mildly tachycardic, and had a significant leukocytosis. Dr Druscilla Brownie, kindly provided dermatologic consultation, and opined that this clinical picture is consistent with drug reaction versus vasculitis versus erythema multiforme. He took biopsies of lesional and perilesional skin of the right lower extremity for routine histology and direct immunofluorescence, and recommended systemic steroid treatment. 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 HIV, RPR testing was non-reactive, hepatitis serology is negative for Hepatitis B and C. ANA is negative. Anti-DS DNA was negative. Patient will follow up with Dr Allyson Sabal on discharge, with oral Prednisone taper, which was been commenced on 09/08/11.  2. HTN (hypertension): Sub-optimally controlled initially. Currently on ARB. Norvasc has been added, with satisfactory control.  3. Right thigh abscess: Patient had a small abscess of right upper, inner thigh. I&D was performed by Dr Allyson Sabal, and revealed only bloody discharge. Bacterial culture and sensitivity obtained. Cultures are negative so far, ie, blood and abscess cultures. Antibiotic was changed to Doxycycline on 09/07/11, and a further 7 days of treatment is planned, to be concluded on 09/14/11.  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 He has no discernible family history of VTE at a young age. Patient has no chest pain, dyspnea or hypoxia. 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 Have discussed with Dr Murriel Hopper on 09/07/11, and he has kindly offered to have patient see him in clinic, in about one month. AntiDsDNA was negative. Pharmacy managed anticoagulation, and INR was 1.83 on 09/10/11. As of that date, patient is on day#6 of combined Heparin/Coumadin therapy. Lovenox will be discontinued, after INR reaches therapeutic range, for 48 hours. This will be coordinated by PMD.   Comment: Stable for discharge on 09/10/11. Recommended off work till 09/24/11.   Time spent on Discharge: 40 mins.  Signed: Alexxis Mackert,CHRISTOPHER 09/10/2011, 10:55 AM

## 2011-09-11 ENCOUNTER — Encounter: Payer: Self-pay | Admitting: Family Medicine

## 2011-09-11 ENCOUNTER — Ambulatory Visit: Payer: 59

## 2011-09-11 ENCOUNTER — Ambulatory Visit (INDEPENDENT_AMBULATORY_CARE_PROVIDER_SITE_OTHER): Payer: 59 | Admitting: Family Medicine

## 2011-09-11 VITALS — BP 138/96 | HR 122 | Temp 98.2°F | Wt 370.8 lb

## 2011-09-11 DIAGNOSIS — I82409 Acute embolism and thrombosis of unspecified deep veins of unspecified lower extremity: Secondary | ICD-10-CM

## 2011-09-11 DIAGNOSIS — I10 Essential (primary) hypertension: Secondary | ICD-10-CM

## 2011-09-11 DIAGNOSIS — L989 Disorder of the skin and subcutaneous tissue, unspecified: Secondary | ICD-10-CM

## 2011-09-11 DIAGNOSIS — R609 Edema, unspecified: Secondary | ICD-10-CM | POA: Insufficient documentation

## 2011-09-11 DIAGNOSIS — R21 Rash and other nonspecific skin eruption: Secondary | ICD-10-CM

## 2011-09-11 LAB — CARDIOLIPIN ANTIBODIES, IGG, IGM, IGA
Anticardiolipin IgG: 5 GPL U/mL — ABNORMAL LOW (ref <23)
Anticardiolipin IgM: 1 MPL U/mL — ABNORMAL LOW (ref <11)

## 2011-09-11 LAB — POCT INR: INR: 2.3

## 2011-09-11 LAB — CULTURE, BLOOD (ROUTINE X 2): Culture: NO GROWTH

## 2011-09-11 LAB — BETA-2-GLYCOPROTEIN I ABS, IGG/M/A: Beta-2-Glycoprotein I IgM: 0 M Units (ref <20)

## 2011-09-11 MED ORDER — HYDROCHLOROTHIAZIDE 25 MG PO TABS: 25.0000 mg | ORAL_TABLET | Freq: Every day | ORAL | Status: DC

## 2011-09-11 MED ORDER — HYDROCODONE-ACETAMINOPHEN 5-325 MG PO TABS: ORAL_TABLET | ORAL | Status: DC

## 2011-09-11 NOTE — Assessment & Plan Note (Addendum)
lovenox today and tomorrow con't coumadin Pt has appt rest of week for PT check Referral put in for hematology per d/c instructions

## 2011-09-11 NOTE — Assessment & Plan Note (Signed)
con't meds Add hctz Recheck 2 weeks

## 2011-09-11 NOTE — Assessment & Plan Note (Signed)
hctz 25 mg 1 po qd  Keep elevated

## 2011-09-11 NOTE — Progress Notes (Signed)
  Subjective:    Patient ID: Parker West, male    DOB: 05/11/1970, 41 y.o.   MRN: [id number]  HPI Pt here for hospital f/u --- he was admitted for DVT.  Pt states Derm saw him in hospital for the blisters and rash on his legs.  Pt c/o con't calf pain.  He is on lovenox and coumadin.  No cp, sob etc.  Review of Systems    as as above Objective:   Physical Exam  Constitutional: He is oriented to person, place, and time. No distress.  Cardiovascular: Normal rate, regular rhythm and normal heart sounds.   No murmur heard. Pulmonary/Chest: Effort normal and breath sounds normal. No respiratory distress. He has no wheezes. He has no rales. He exhibits no tenderness.  Musculoskeletal: He exhibits edema and tenderness.  Neurological: He is alert and oriented to person, place, and time.  Skin: Rash noted. He is not diaphoretic.       Multiple blisters and ulcers on both legs  Psychiatric: He has a normal mood and affect. His behavior is normal. Judgment normal.          Assessment & Plan:

## 2011-09-11 NOTE — Assessment & Plan Note (Signed)
Derm referral for hosp f/u con't doxy

## 2011-09-11 NOTE — Patient Instructions (Signed)
Deep Vein Thrombosis A deep vein thrombosis (DVT) is a blood clot (thrombus) that develops in a deep vein. A DVT is a clot in the deep, larger veins of the leg, arm, or pelvis. These are more dangerous than clots that might form in veins on the surface of the body. Deep vein thrombosis can lead to complications if the clot breaks off and travels in the bloodstream to the lungs. CAUSES Blood clots form in a vein for different reasons. Usually several things cause blood clots. They include:  The flow of blood slows down.   The inside of the vein is damaged in some way.   The person has a condition that makes blood clot more easily. These conditions may include:   Older age (especially over 75 years old).   Having a history of blood clots.   Having major or lengthy surgery. Hip surgery is particularly high-risk.   Breaking a hip or leg.   Sitting or lying still for a long time.   Cancer or cancer treatment.   Having a long, thin tube (catheter) placed inside a vein during a medical procedure.   Being overweight (obese).   Pregnancy and childbirth.   Medicines with estrogen.   Smoking.   Other circulation or heart problems.  SYMPTOMS When a clot forms, it can either partially or totally block the blood flow in that vein. Symptoms of a DVT can include:  Swelling of the leg or arm, especially if one side is much worse.   Warmth and redness of the leg or arm, especially if one side is much worse.   Pain in an arm or leg. If the clot is in the leg, symptoms may be more noticeable or worse when standing or walking.  If the blood clot travels to the lung, it may cause:  Shortness of breath.   Chest pain. The pain may be worsened by deep breaths.   Coughing up thick mucus (phlegm), possibly flecked with blood.  Anyone with these symptoms should get emergency medical treatment right away. Call your local emergency services (911 in [place] if you have these symptoms. DIAGNOSIS If  a DVT is suspected, your caregiver will take a full medical history. He or she will also perform a physical exam. Tests that also may be required include:  Studies of the clotting properties of the blood.   An ultrasound scan.   X-rays to show the flow of blood when special dye is injected into the veins (venography).   Studies of your lungs if you have any chest symptoms.  PREVENTION  Exercise the legs regularly. Take a brisk 30 minute walk every day.   Maintain a weight that is appropriate for your height.   Avoid sitting or lying in bed for long periods of time without moving your legs.   Women, particularly those over the age of 35, should consider the risks and benefits of taking estrogen medicines, including birth control pills.   Do not smoke, especially if you take estrogen medicines.   Long-distance travel can increase your risk. You should exercise your legs by walking or pumping the muscles every hour.   In hospital prevention:   Prevention may include medical and nonmedical measures.  TREATMENT  The most common treatment for DVT is blood thinning (anticoagulant) medicine, which reduces the blood's tendency to clot. Anticoagulants can stop new blood clots from forming and old ones from growing. They cannot dissolve existing clots. Your body does this by itself over time.   Anticoagulants can be given by mouth, by intravenous (IV) access, or by injection. Your caregiver will determine the best program for you.   Less commonly, clot-dissolving drugs (thrombolytics) are used to dissolve a DVT. They carry a high risk of bleeding, so they are used mainly in severe cases.   Very rarely, a blood clot in the leg needs to be removed surgically.   If you are unable to take anticoagulants, your caregiver may arrange for you to have a filter placed in a main vein in your belly (abdomen). This filter prevents clots from traveling to your lungs.  HOME CARE INSTRUCTIONS  Take all  medicines prescribed by your caregiver. Follow the directions carefully.   You will most likely continue taking anticoagulants after you leave the hospital. Your caregiver will advise you on the length of treatment (usually 3 to 6 months, sometimes for life).   Taking too much or too little of an anticoagulant is dangerous. While taking this type of medicine, you will need to have regular blood tests to be sure the dose is correct. The dose can change for many reasons. It is critically important that you take this medicine exactly as prescribed, and that you have blood tests exactly as directed.   Many foods can interfere with anticoagulants. These include foods high in vitamin K, such as spinach, kale, broccoli, cabbage, collard and turnip greens, Brussels sprouts, peas, cauliflower, seaweed, parsley, beef and pork liver, green tea, and soybean oil. Your caregiver should discuss limits on these foods with you or you should arrange a visit with a dietician to answer your questions.   Many medicines can interfere with anticoagulants. You must tell your caregiver about any and all medicines you take. This includes all vitamins and supplements. Be especially cautious with aspirin and anti-inflammatory medicines. Ask your caregiver before taking these.   Anticoagulants can have side effects, mostly excessive bruising or bleeding. You will need to hold pressure over cuts for longer than usual. Avoid alcoholic drinks or consume only very small amounts while taking this medicine.   If you are taking an anticoagulant:   Wear a medical alert bracelet.   Notify your dentist or other caregivers before procedures.   Avoid contact sports.   Ask your caregiver how soon you can go back to normal activities. Not being active can lead to new clots. Ask for a list of what you should and should not do.   Exercise your lower leg muscles. This is important while traveling.   You may need to wear compression  stockings. These are tight elastic stockings that apply pressure to the lower legs. This can help keep the blood in the legs from clotting.   If you are a smoker, you should quit.   Learn as much as you can about DVT.  SEEK MEDICAL CARE IF:  You have unusual bruising or any bleeding problems.   The swelling or pain in your affected arm or leg is not gradually improving.   You anticipate surgery or long-distance travel. You should get specific advice on DVT prevention.   You discover other family members with blood clots. This may require further testing for inherited diseases or conditions.  SEEK IMMEDIATE MEDICAL CARE IF:  You develop chest pain.   You develop severe shortness of breath.   You begin to cough up bloody mucus or phlegm (sputum).   You feel dizzy or faint.   You develop swelling or pain in the leg.   You have   breathing problems after traveling.  MAKE SURE YOU:  Understand these instructions.   Will watch your condition.   Will get help right away if you are not doing well or get worse.  Document Released: 02/18/2005 Document Revised: 02/07/2011 Document Reviewed: 04/12/2010 [employer] Patient Information 2012 [employer]. 

## 2011-09-12 ENCOUNTER — Telehealth: Payer: Self-pay | Admitting: Oncology

## 2011-09-12 ENCOUNTER — Ambulatory Visit: Payer: 59 | Admitting: Family Medicine

## 2011-09-12 ENCOUNTER — Ambulatory Visit (INDEPENDENT_AMBULATORY_CARE_PROVIDER_SITE_OTHER): Payer: 59 | Admitting: *Deleted

## 2011-09-12 VITALS — BP 122/90 | HR 119 | Temp 98.2°F | Wt 366.0 lb

## 2011-09-12 DIAGNOSIS — I82409 Acute embolism and thrombosis of unspecified deep veins of unspecified lower extremity: Secondary | ICD-10-CM

## 2011-09-12 DIAGNOSIS — Z7901 Long term (current) use of anticoagulants: Secondary | ICD-10-CM

## 2011-09-12 LAB — POCT INR: INR: 2

## 2011-09-12 NOTE — Patient Instructions (Addendum)
INR today 2.0  Continue current dose: 2 tab daily  Return to office in 1 day  Continue with Lovenox twice a day

## 2011-09-12 NOTE — Telephone Encounter (Signed)
Talked to patient and he is aware of appt , date and time

## 2011-09-13 ENCOUNTER — Telehealth: Payer: Self-pay | Admitting: Oncology

## 2011-09-13 ENCOUNTER — Ambulatory Visit (INDEPENDENT_AMBULATORY_CARE_PROVIDER_SITE_OTHER): Payer: 59 | Admitting: *Deleted

## 2011-09-13 VITALS — BP 140/90 | HR 99 | Wt 366.0 lb

## 2011-09-13 DIAGNOSIS — I82409 Acute embolism and thrombosis of unspecified deep veins of unspecified lower extremity: Secondary | ICD-10-CM

## 2011-09-13 DIAGNOSIS — Z7901 Long term (current) use of anticoagulants: Secondary | ICD-10-CM

## 2011-09-13 LAB — POCT INR: INR: 2.1

## 2011-09-13 NOTE — Patient Instructions (Signed)
INR today: 2.1  Return to office on Monday  Continue current dose: 2 tabs daily and finish the Lovenox injection.

## 2011-09-13 NOTE — Telephone Encounter (Signed)
Referred by Dr. Loreen Freud Dx-DVT

## 2011-09-16 ENCOUNTER — Encounter: Payer: Self-pay | Admitting: Family Medicine

## 2011-09-16 ENCOUNTER — Ambulatory Visit (INDEPENDENT_AMBULATORY_CARE_PROVIDER_SITE_OTHER): Payer: 59 | Admitting: Family Medicine

## 2011-09-16 VITALS — BP 132/72 | HR 122 | Temp 98.4°F | Wt 361.0 lb

## 2011-09-16 DIAGNOSIS — R0602 Shortness of breath: Secondary | ICD-10-CM

## 2011-09-16 DIAGNOSIS — L989 Disorder of the skin and subcutaneous tissue, unspecified: Secondary | ICD-10-CM

## 2011-09-16 DIAGNOSIS — R609 Edema, unspecified: Secondary | ICD-10-CM

## 2011-09-16 DIAGNOSIS — R000 Tachycardia, unspecified: Secondary | ICD-10-CM

## 2011-09-16 DIAGNOSIS — I82409 Acute embolism and thrombosis of unspecified deep veins of unspecified lower extremity: Secondary | ICD-10-CM

## 2011-09-16 DIAGNOSIS — L0390 Cellulitis, unspecified: Secondary | ICD-10-CM

## 2011-09-16 DIAGNOSIS — I10 Essential (primary) hypertension: Secondary | ICD-10-CM

## 2011-09-16 LAB — POCT INR: INR: 2.2

## 2011-09-16 MED ORDER — FUROSEMIDE 40 MG PO TABS: 40.0000 mg | ORAL_TABLET | Freq: Every day | ORAL | Status: DC

## 2011-09-16 MED ORDER — DOXYCYCLINE HYCLATE 100 MG PO TABS: 100.0000 mg | ORAL_TABLET | Freq: Two times a day (BID) | ORAL | Status: AC

## 2011-09-16 MED ORDER — METOPROLOL SUCCINATE ER 25 MG PO TB24: 25.0000 mg | ORAL_TABLET | Freq: Every day | ORAL | Status: DC

## 2011-09-16 NOTE — Assessment & Plan Note (Signed)
inr 2.2 today Repeat 2 weeks

## 2011-09-16 NOTE — Assessment & Plan Note (Signed)
2d echo toprol xl 25mg  in addition to current meds to slow heart rate

## 2011-09-16 NOTE — Patient Instructions (Addendum)
Edema Edema is an abnormal build-up of fluids in tissues. Because this is partly dependent on gravity (water flows to the lowest place), it is more common in the leg sand thighs (lower extremities). It is also common in the looser tissues, like around the eyes. Painless swelling of the feet and ankles is common and increases as a person ages. It may affect both legs and may include the calves or even thighs. When squeezed, the fluid may move out of the affected area and may leave a dent for a few moments. CAUSES   Prolonged standing or sitting in one place for extended periods of time. Movement helps pump tissue fluid into the veins, and absence of movement prevents this, resulting in edema.   Varicose veins. The valves in the veins do not work as well as they should. This causes fluid to leak into the tissues.   Fluid and salt overload.   Injury, burn, or surgery to the leg, ankle, or foot, may damage veins and allow fluid to leak out.   Sunburn damages vessels. Leaky vessels allow fluid to go out into the sunburned tissues.   Allergies (from insect bites or stings, medications or chemicals) cause swelling by allowing vessels to become leaky.   Protein in the blood helps keep fluid in your vessels. Low protein, as in malnutrition, allows fluid to leak out.   Hormonal changes, including pregnancy and menstruation, cause fluid retention. This fluid may leak out of vessels and cause edema.   Medications that cause fluid retention. Examples are sex hormones, blood pressure medications, steroid treatment, or anti-depressants.   Some illnesses cause edema, especially heart failure, kidney disease, or liver disease.   Surgery that cuts veins or lymph nodes, such as surgery done for the heart or for breast cancer, may result in edema.  DIAGNOSIS  Your caregiver is usually easily able to determine what is causing your swelling (edema) by simply asking what is wrong (getting a history) and examining  you (doing a physical). Sometimes x-rays, EKG (electrocardiogram or heart tracing), and blood work may be done to evaluate for underlying medical illness. TREATMENT  General treatment includes:  Leg elevation (or elevation of the affected body part).   Restriction of fluid intake.   Prevention of fluid overload.   Compression of the affected body part. Compression with elastic bandages or support stockings squeezes the tissues, preventing fluid from entering and forcing it back into the blood vessels.   Diuretics (also called water pills or fluid pills) pull fluid out of your body in the form of increased urination. These are effective in reducing the swelling, but can have side effects and must be used only under your caregiver's supervision. Diuretics are appropriate only for some types of edema.  The specific treatment can be directed at any underlying causes discovered. Heart, liver, or kidney disease should be treated appropriately. HOME CARE INSTRUCTIONS   Elevate the legs (or affected body part) above the level of the heart, while lying down.   Avoid sitting or standing still for prolonged periods of time.   Avoid putting anything directly under the knees when lying down, and do not wear constricting clothing or garters on the upper legs.   Exercising the legs causes the fluid to work back into the veins and lymphatic channels. This may help the swelling go down.   The pressure applied by elastic bandages or support stockings can help reduce ankle swelling.   A low-salt diet may help reduce fluid   retention and decrease the ankle swelling.   Take any medications exactly as prescribed.  SEEK MEDICAL CARE IF:  Your edema is not responding to recommended treatments. SEEK IMMEDIATE MEDICAL CARE IF:   You develop shortness of breath or chest pain.   You cannot breathe when you lay down; or if, while lying down, you have to get up and go to the window to get your breath.   You  are having increasing swelling without relief from treatment.   You develop a fever over 102 F (38.9 C).   You develop pain or redness in the areas that are swollen.   Tell your caregiver right away if you have gained 3 lb/1.4 kg in 1 day or 5 lb/2.3 kg in a week.  MAKE SURE YOU:   Understand these instructions.   Will watch your condition.   Will get help right away if you are not doing well or get worse.  Document Released: 02/18/2005 Document Revised: 02/07/2011 Document Reviewed: 10/07/2007 [employer] Patient Information 2012 [employer]. 

## 2011-09-16 NOTE — Assessment & Plan Note (Signed)
Refer to derm-- call Dr Elease Etienne office to try to get him in sooner See med list

## 2011-09-16 NOTE — Progress Notes (Signed)
  Subjective:    Patient ID: Parker West, male    DOB: 10/05/1970, 41 y.o.   MRN: [id number]  HPI Pt here with his wife for f/u dvt.  He is still in a lot of pain in legs---it did improve with diuretic but not a lot. Pt is also c/o palpitations --no chest pain.    Review of Systems    as above Objective:   Physical Exam  Constitutional: He is oriented to person, place, and time. He appears well-developed and well-nourished.  Neck: Normal range of motion. Neck supple.  Cardiovascular:  No murmur heard.      tachycardia  Pulmonary/Chest: Effort normal and breath sounds normal. No respiratory distress. He has no wheezes. He has no rales. He exhibits no tenderness.  Musculoskeletal: He exhibits edema and tenderness.  Neurological: He is alert and oriented to person, place, and time.  Skin:       + flaky skin with breaks in skin-- all over low ext Tender to touch New rash on wrist b/l that occurred as he was sitting here  Psychiatric: He has a normal mood and affect. His behavior is normal. Judgment and thought content normal.          Assessment & Plan:  If any increase in symptoms--sob, cp, increase pain---go to ER

## 2011-09-16 NOTE — Assessment & Plan Note (Signed)
Lasix 40 mg qd D/c hctz Elevate legs

## 2011-09-17 ENCOUNTER — Other Ambulatory Visit: Payer: Self-pay | Admitting: Internal Medicine

## 2011-09-18 ENCOUNTER — Encounter: Payer: Self-pay | Admitting: Family Medicine

## 2011-09-18 ENCOUNTER — Other Ambulatory Visit: Payer: Self-pay | Admitting: Family Medicine

## 2011-09-19 MED ORDER — WARFARIN SODIUM 7.5 MG PO TABS: ORAL_TABLET | ORAL | Status: DC

## 2011-09-19 MED ORDER — HYDROCODONE-ACETAMINOPHEN 5-325 MG PO TABS: ORAL_TABLET | ORAL | Status: DC

## 2011-09-19 NOTE — Telephone Encounter (Signed)
Medications sent and the patient has been made aware.      KP

## 2011-09-20 ENCOUNTER — Ambulatory Visit (INDEPENDENT_AMBULATORY_CARE_PROVIDER_SITE_OTHER): Payer: 59 | Admitting: *Deleted

## 2011-09-20 VITALS — BP 120/82 | HR 117 | Wt 358.0 lb

## 2011-09-20 DIAGNOSIS — I82409 Acute embolism and thrombosis of unspecified deep veins of unspecified lower extremity: Secondary | ICD-10-CM

## 2011-09-20 DIAGNOSIS — Z7901 Long term (current) use of anticoagulants: Secondary | ICD-10-CM

## 2011-09-20 LAB — POCT INR: INR: 1.4

## 2011-09-20 NOTE — Patient Instructions (Signed)
Return to office next week  New dosing: 2 tabs today then resume 1 tab daily

## 2011-09-23 ENCOUNTER — Ambulatory Visit: Payer: 59 | Admitting: Family Medicine

## 2011-09-24 ENCOUNTER — Ambulatory Visit (INDEPENDENT_AMBULATORY_CARE_PROVIDER_SITE_OTHER): Payer: 59 | Admitting: Family Medicine

## 2011-09-24 ENCOUNTER — Encounter: Payer: Self-pay | Admitting: Family Medicine

## 2011-09-24 ENCOUNTER — Ambulatory Visit: Payer: 59 | Admitting: Family Medicine

## 2011-09-24 VITALS — BP 122/74 | HR 124 | Temp 98.0°F | Wt 354.4 lb

## 2011-09-24 DIAGNOSIS — I82409 Acute embolism and thrombosis of unspecified deep veins of unspecified lower extremity: Secondary | ICD-10-CM

## 2011-09-24 DIAGNOSIS — I10 Essential (primary) hypertension: Secondary | ICD-10-CM

## 2011-09-24 NOTE — Patient Instructions (Addendum)
Take 2 1/2 tab coumadin tonight then resume 2 tabs a day and recheck Friday.

## 2011-09-25 ENCOUNTER — Encounter: Payer: Self-pay | Admitting: Family Medicine

## 2011-09-25 ENCOUNTER — Ambulatory Visit (HOSPITAL_COMMUNITY): Payer: 59 | Attending: Internal Medicine | Admitting: Radiology

## 2011-09-25 DIAGNOSIS — R0602 Shortness of breath: Secondary | ICD-10-CM

## 2011-09-25 DIAGNOSIS — I517 Cardiomegaly: Secondary | ICD-10-CM

## 2011-09-25 DIAGNOSIS — R000 Tachycardia, unspecified: Secondary | ICD-10-CM | POA: Insufficient documentation

## 2011-09-25 DIAGNOSIS — I10 Essential (primary) hypertension: Secondary | ICD-10-CM | POA: Insufficient documentation

## 2011-09-25 NOTE — Progress Notes (Signed)
Echocardiogram performed.  

## 2011-09-25 NOTE — Progress Notes (Signed)
  Subjective:    Patient ID: Parker West, male    DOB: 07/09/1970, 41 y.o.   MRN: [id number]  HPI Pt here to f/u dvt.  Pain is better and swelling has improved.   Pt is taking 15 mg coumadin a day.   Hematology referral pending.      Review of Systems as above   Objective:   Physical Exam  Constitutional: He is oriented to person, place, and time. He appears well-developed.  Musculoskeletal: He exhibits tenderness. He exhibits no edema.       Pain has improved but he still needs med  Neurological: He is alert and oriented to person, place, and time.  Psychiatric: He has a normal mood and affect. His behavior is normal. Judgment and thought content normal.          Assessment & Plan:

## 2011-09-27 ENCOUNTER — Ambulatory Visit (INDEPENDENT_AMBULATORY_CARE_PROVIDER_SITE_OTHER): Payer: 59 | Admitting: Family Medicine

## 2011-09-27 ENCOUNTER — Encounter: Payer: Self-pay | Admitting: Family Medicine

## 2011-09-27 VITALS — BP 126/76 | HR 111 | Wt 354.0 lb

## 2011-09-27 DIAGNOSIS — I82409 Acute embolism and thrombosis of unspecified deep veins of unspecified lower extremity: Secondary | ICD-10-CM

## 2011-09-27 NOTE — Patient Instructions (Signed)
Cont coumadin 15 mg a day--- we will recheck Pt in 2 weeks     Deep Vein Thrombosis A deep vein thrombosis (DVT) is a blood clot (thrombus) that develops in a deep vein. A DVT is a clot in the deep, larger veins of the leg, arm, or pelvis. These are more dangerous than clots that might form in veins on the surface of the body. Deep vein thrombosis can lead to complications if the clot breaks off and travels in the bloodstream to the lungs. CAUSES Blood clots form in a vein for different reasons. Usually several things cause blood clots. They include:  The flow of blood slows down.   The inside of the vein is damaged in some way.   The person has a condition that makes blood clot more easily. These conditions may include:   Older age (especially over 86 years old).   Having a history of blood clots.   Having major or lengthy surgery. Hip surgery is particularly high-risk.   Breaking a hip or leg.   Sitting or lying still for a long time.   Cancer or cancer treatment.   Having a long, thin tube (catheter) placed inside a vein during a medical procedure.   Being overweight (obese).   Pregnancy and childbirth.   Medicines with estrogen.   Smoking.   Other circulation or heart problems.  SYMPTOMS When a clot forms, it can either partially or totally block the blood flow in that vein. Symptoms of a DVT can include:  Swelling of the leg or arm, especially if one side is much worse.   Warmth and redness of the leg or arm, especially if one side is much worse.   Pain in an arm or leg. If the clot is in the leg, symptoms may be more noticeable or worse when standing or walking.  If the blood clot travels to the lung, it may cause:  Shortness of breath.   Chest pain. The pain may be worsened by deep breaths.   Coughing up thick mucus (phlegm), possibly flecked with blood.  Anyone with these symptoms should get emergency medical treatment right away. Call your local  emergency services (911 in [place] if you have these symptoms. DIAGNOSIS If a DVT is suspected, your caregiver will take a full medical history. He or she will also perform a physical exam. Tests that also may be required include:  Studies of the clotting properties of the blood.   An ultrasound scan.   X-rays to show the flow of blood when special dye is injected into the veins (venography).   Studies of your lungs if you have any chest symptoms.  PREVENTION  Exercise the legs regularly. Take a brisk 30 minute walk every day.   Maintain a weight that is appropriate for your height.   Avoid sitting or lying in bed for long periods of time without moving your legs.   Women, particularly those over the age of 4, should consider the risks and benefits of taking estrogen medicines, including birth control pills.   Do not smoke, especially if you take estrogen medicines.   Long-distance travel can increase your risk. You should exercise your legs by walking or pumping the muscles every hour.   In hospital prevention:   Prevention may include medical and nonmedical measures.  TREATMENT  The most common treatment for DVT is blood thinning (anticoagulant) medicine, which reduces the blood's tendency to clot. Anticoagulants can stop new blood clots from forming and  old ones from growing. They cannot dissolve existing clots. Your body does this by itself over time. Anticoagulants can be given by mouth, by intravenous (IV) access, or by injection. Your caregiver will determine the best program for you.   Less commonly, clot-dissolving drugs (thrombolytics) are used to dissolve a DVT. They carry a high risk of bleeding, so they are used mainly in severe cases.   Very rarely, a blood clot in the leg needs to be removed surgically.   If you are unable to take anticoagulants, your caregiver may arrange for you to have a filter placed in a main vein in your belly (abdomen). This filter prevents  clots from traveling to your lungs.  HOME CARE INSTRUCTIONS  Take all medicines prescribed by your caregiver. Follow the directions carefully.   You will most likely continue taking anticoagulants after you leave the hospital. Your caregiver will advise you on the length of treatment (usually 3 to 6 months, sometimes for life).   Taking too much or too little of an anticoagulant is dangerous. While taking this type of medicine, you will need to have regular blood tests to be sure the dose is correct. The dose can change for many reasons. It is critically important that you take this medicine exactly as prescribed, and that you have blood tests exactly as directed.   Many foods can interfere with anticoagulants. These include foods high in vitamin K, such as spinach, kale, broccoli, cabbage, collard and turnip greens, Brussels sprouts, peas, cauliflower, seaweed, parsley, beef and pork liver, green tea, and soybean oil. Your caregiver should discuss limits on these foods with you or you should arrange a visit with a dietician to answer your questions.   Many medicines can interfere with anticoagulants. You must tell your caregiver about any and all medicines you take. This includes all vitamins and supplements. Be especially cautious with aspirin and anti-inflammatory medicines. Ask your caregiver before taking these.   Anticoagulants can have side effects, mostly excessive bruising or bleeding. You will need to hold pressure over cuts for longer than usual. Avoid alcoholic drinks or consume only very small amounts while taking this medicine.   If you are taking an anticoagulant:   Wear a medical alert bracelet.   Notify your dentist or other caregivers before procedures.   Avoid contact sports.   Ask your caregiver how soon you can go back to normal activities. Not being active can lead to new clots. Ask for a list of what you should and should not do.   Exercise your lower leg muscles. This  is important while traveling.   You may need to wear compression stockings. These are tight elastic stockings that apply pressure to the lower legs. This can help keep the blood in the legs from clotting.   If you are a smoker, you should quit.   Learn as much as you can about DVT.  SEEK MEDICAL CARE IF:  You have unusual bruising or any bleeding problems.   The swelling or pain in your affected arm or leg is not gradually improving.   You anticipate surgery or long-distance travel. You should get specific advice on DVT prevention.   You discover other family members with blood clots. This may require further testing for inherited diseases or conditions.  SEEK IMMEDIATE MEDICAL CARE IF:  You develop chest pain.   You develop severe shortness of breath.   You begin to cough up bloody mucus or phlegm (sputum).   You feel  dizzy or faint.   You develop swelling or pain in the leg.   You have breathing problems after traveling.  MAKE SURE YOU:  Understand these instructions.   Will watch your condition.   Will get help right away if you are not doing well or get worse.  Document Released: 02/18/2005 Document Revised: 02/07/2011 Document Reviewed: 04/12/2010 [facility] Patient Information 2012 [place], [place].

## 2011-09-28 ENCOUNTER — Encounter: Payer: Self-pay | Admitting: Family Medicine

## 2011-09-28 NOTE — Progress Notes (Signed)
  Subjective:    Patient ID: Parker West, male    DOB: [birth year], 41 y.o.   MRN: [id number]  HPI Pt here for Pt check.  Pt with no complaints.    Review of Systems As above    Objective:   Physical Exam  Constitutional: He is oriented to person, place, and time. He appears well-developed and well-nourished.  Neck: Normal range of motion. Neck supple.  Cardiovascular:       +S1S2  No murmur tachycardia  Pulmonary/Chest: Effort normal and breath sounds normal. No respiratory distress. He has no wheezes. He has no rales.  Musculoskeletal: He exhibits edema and tenderness.       Swelling and tenderness is better than previous visits  Neurological: He is alert and oriented to person, place, and time.  Psychiatric: He has a normal mood and affect. His behavior is normal.          Assessment & Plan:

## 2011-09-28 NOTE — Assessment & Plan Note (Signed)
con't current dose coumadin rto 2 weeks or sooner prn

## 2011-10-02 ENCOUNTER — Ambulatory Visit (INDEPENDENT_AMBULATORY_CARE_PROVIDER_SITE_OTHER): Payer: 59 | Admitting: Family Medicine

## 2011-10-02 ENCOUNTER — Encounter: Payer: Self-pay | Admitting: Family Medicine

## 2011-10-02 VITALS — BP 138/78 | HR 135 | Temp 98.9°F | Wt 354.2 lb

## 2011-10-02 DIAGNOSIS — L03119 Cellulitis of unspecified part of limb: Secondary | ICD-10-CM

## 2011-10-02 DIAGNOSIS — L989 Disorder of the skin and subcutaneous tissue, unspecified: Secondary | ICD-10-CM

## 2011-10-02 DIAGNOSIS — I82409 Acute embolism and thrombosis of unspecified deep veins of unspecified lower extremity: Secondary | ICD-10-CM

## 2011-10-02 NOTE — Progress Notes (Signed)
  Subjective:    Patient ID: Parker West, male    DOB: [birth year], 41 y.o.   MRN: [id number]  HPI    Review of Systems     Objective:   Physical Exam        Assessment & Plan:  D/w Dr Cyndie Chime--- ok for pt to go back to work as long as he makes sure he keeps his appointment next week and is taking coumadin daily

## 2011-10-02 NOTE — Assessment & Plan Note (Signed)
On coumadin To see hematolgy next week--- we are trying to get him in sooner

## 2011-10-02 NOTE — Progress Notes (Signed)
  Subjective:    Patient ID: Parker West, male    DOB: Oct 20, 1970, 41 y.o.   MRN: [id number]  HPI Pt here to discuss going back to work but his appointment with hematology is not until 10/09/2011. Pt is still having pain in legs and takng pain meds---he has had none today.  The swelling is better. Pt thinks he can take pain med at night only.      Review of Systems    as above Objective:   Physical Exam  Constitutional: He is oriented to person, place, and time. He appears well-developed and well-nourished.  Neck: Normal range of motion. Neck supple.  Musculoskeletal: He exhibits no edema.       Pt c/o pain in legs but non with palpation  Neurological: He is alert and oriented to person, place, and time.  Skin:       Lesions on legs healing  Psychiatric: He has a normal mood and affect. His behavior is normal. Judgment and thought content normal.          Assessment & Plan:

## 2011-10-02 NOTE — Assessment & Plan Note (Signed)
healing

## 2011-10-02 NOTE — Assessment & Plan Note (Signed)
resolved 

## 2011-10-04 ENCOUNTER — Other Ambulatory Visit: Payer: Self-pay | Admitting: Oncology

## 2011-10-04 DIAGNOSIS — I82409 Acute embolism and thrombosis of unspecified deep veins of unspecified lower extremity: Secondary | ICD-10-CM

## 2011-10-09 ENCOUNTER — Ambulatory Visit: Payer: 59

## 2011-10-09 ENCOUNTER — Ambulatory Visit (HOSPITAL_BASED_OUTPATIENT_CLINIC_OR_DEPARTMENT_OTHER): Payer: 59 | Admitting: Oncology

## 2011-10-09 ENCOUNTER — Encounter: Payer: Self-pay | Admitting: Oncology

## 2011-10-09 ENCOUNTER — Other Ambulatory Visit (HOSPITAL_BASED_OUTPATIENT_CLINIC_OR_DEPARTMENT_OTHER): Payer: 59 | Admitting: Lab

## 2011-10-09 VITALS — BP 132/90 | HR 121 | Temp 97.1°F | Resp 20 | Ht 71.5 in | Wt 354.9 lb

## 2011-10-09 DIAGNOSIS — L03119 Cellulitis of unspecified part of limb: Secondary | ICD-10-CM

## 2011-10-09 DIAGNOSIS — R894 Abnormal immunological findings in specimens from other organs, systems and tissues: Secondary | ICD-10-CM

## 2011-10-09 DIAGNOSIS — I82409 Acute embolism and thrombosis of unspecified deep veins of unspecified lower extremity: Secondary | ICD-10-CM

## 2011-10-09 DIAGNOSIS — R760 Raised antibody titer: Secondary | ICD-10-CM

## 2011-10-09 DIAGNOSIS — I82403 Acute embolism and thrombosis of unspecified deep veins of lower extremity, bilateral: Secondary | ICD-10-CM

## 2011-10-09 DIAGNOSIS — R0600 Dyspnea, unspecified: Secondary | ICD-10-CM

## 2011-10-09 DIAGNOSIS — K921 Melena: Secondary | ICD-10-CM | POA: Insufficient documentation

## 2011-10-09 DIAGNOSIS — R3129 Other microscopic hematuria: Secondary | ICD-10-CM

## 2011-10-09 DIAGNOSIS — M310 Hypersensitivity angiitis: Secondary | ICD-10-CM

## 2011-10-09 DIAGNOSIS — D509 Iron deficiency anemia, unspecified: Secondary | ICD-10-CM

## 2011-10-09 HISTORY — DX: Dyspnea, unspecified: R06.00

## 2011-10-09 HISTORY — DX: Melena: K92.1

## 2011-10-09 HISTORY — DX: Iron deficiency anemia, unspecified: D50.9

## 2011-10-09 LAB — CBC & DIFF AND RETIC
BASO%: 0.2 % (ref 0.0–2.0)
HCT: 29.2 % — ABNORMAL LOW (ref 38.4–49.9)
LYMPH%: 17.5 % (ref 14.0–49.0)
MCHC: 33.9 g/dL (ref 32.0–36.0)
MCV: 70.9 fL — ABNORMAL LOW (ref 79.3–98.0)
MONO#: 1.2 10*3/uL — ABNORMAL HIGH (ref 0.1–0.9)
MONO%: 9.5 % (ref 0.0–14.0)
NEUT%: 71.4 % (ref 39.0–75.0)
Platelets: 397 10*3/uL (ref 140–400)
WBC: 12.1 10*3/uL — ABNORMAL HIGH (ref 4.0–10.3)

## 2011-10-09 LAB — MORPHOLOGY: PLT EST: ADEQUATE

## 2011-10-09 LAB — APTT: aPTT: 42.6 seconds — ABNORMAL HIGH (ref 24–37)

## 2011-10-09 NOTE — Progress Notes (Signed)
New Patient Hematology-Oncology Evaluation   Parker West [id number] February 07, 1971 40 y.o. 10/09/2011  CC: Dr. Loreen Freud   Reason for referral: Recent bilateral lower extremity DVTs. Positive lupus anticoagulant test.   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 He has hypertension and he started on losartan about 5 months ago.  He presented with a one-week history of a stinging pain in his legs bilaterally which got progressively worse to the point where he had difficulty walking. He then began to notice a rash one day before he presented to the hospital for evaluation. He was found to have extensive bullous and erythematous eruption bilateral lower extremities with additional erythematous rash on his trunk. He was admitted to the hospital on July 3. He had a low-grade fever and elevated white count. He was started on broad-spectrum parenteral antibiotics. Venous Doppler studies were done and showed bilateral lower extremity thrombosis right popliteal vein and left common femoral vein. He was anticoagulated. Dermatology consultation was obtained. He underwent biopsy one of these skin lesions. Pathology showed leukocytoclastic vasculitis. He had an extensive laboratory evaluation. HIV, RPR, hepatitis A., B., C., all negative. Anti-double strand DNA negative. Factor V Leiden gene mutation not detected. He had a positive lupus-type anticoagulant. I discussed this result with the hospitalist and he assured me that the test was done prior to starting any heparin products. Anticardiolipin Antibodies and antibodies to beta 2 glycoprotein 1 were not detected. Antithrombin level 79% (75-120), protein S antigen 77% (60-150), functional protein S slightly decreased 66% (69-129), protein C total 99% (70 to-160), functional protein C 161% (75-133). Plasma homocystine 9.1 (4-15.4).  He denied any history of recent  infectious exposures. His only medication prior to admission was Losartan  started about 5 months ago. He has no signs or symptoms of a collagen vascular disorder.  He admits to some air hunger. Of note a chest radiograph or CT of the chest was not done during the admission. He admits to low-grade rectal bleeding with occasional blood in his stool which occurred prior to admission and has occurred since discharge as well. No constipation or straining at stools. No history of hemorrhoids. There is no family history of any bleeding disorder but his mother age 63 who is an insulin-dependent diabetic had a spinal cord infarct and recently had to have a limb amputation. Father has hypertension otherwise healthy. 2 brothers and a sister without history of blood clots. 4 healthy children.   PMH: Past Medical History  Diagnosis Date  . Hypertension   . Hyperlipidemia   . Obesity   . Heart murmur     "when I was small"  . Anginal pain   . Pneumonia 1988    "walking"  . Staph infection 09/04/11    BLE/wife's report  . Hematochezia 10/09/2011    Low grade antedated DVT  . Microcytic normochromic anemia 10/09/2011    retic 1.6% Hb 9.9 10/09/11  . Dyspnea 10/09/2011    At rest  Recent DVT     Past Surgical History  Procedure Date  . Reduction mammaplasty 1990's    Allergies: No Known Allergies  Medications: Norvasc 10 mg daily, Lasix 40 mg daily, Vicodin 5/25 one to 2 every 4 hours when necessary, Cozaar 100 mg daily, Toprol-XL 25 mg daily, Coumadin 15 mg at bedtime.   Social History:  he is married. He is a Naval architect. 4 healthy children son 38 son 97  daughter 55 son 77.  reports that he has never smoked. He has never used smokeless tobacco. He reports that he drinks alcohol. He reports that he does not use illicit drugs.  Family History: see history of present illness  Family History  Problem Relation Age of Onset  . Stroke Mother   . Hypertension Mother   . Diabetes Mother   .  Hypertension Father     Review of Systems: Constitutional symptoms: fatigue since recent hospitalization  HEENT: no sore throat  Respiratory:  "trouble getting might wind"  Cardiovascular:   no chest pain or palpitations  Gastrointestinal ROS:  see above  Genito-Urinary ROS:  urine has been dark yellow no blood  Hematological and Lymphatic: Musculoskeletal: legs still hurt but improving  Neurologic: no headache or change in vision  Dermatologic: lower extremity rash healing  Remaining ROS negative.  Physical Exam: Blood pressure 132/90, pulse 121, temperature 97.1 F (36.2 C), temperature source Oral, resp. rate 20, height 5' 11.5" (1.816 m), weight 354 lb 14.4 oz (160.982 kg). Wt Readings from Last 3 Encounters:  10/09/11 354 lb 14.4 oz (160.982 kg)  10/02/11 354 lb 3.2 oz (160.664 kg)  09/27/11 354 lb (160.573 kg)    General appearance: soft-spoken obese African American man Head:  normal Neck:  no bruits  Lymph nodes: no adenopathy. Subcentimeter nonpathologic node right axilla  Breasts: Lungs: clear to auscultation resonant to percussion  Heart: regular rhythm no murmur  Abdominal:Soft obese nontender no gross organomegaly  GU: not examined  Extremities: no edema no calf tenderness left calf 50.5 cm right 49.5 left ankle 29 cm right 28  Neurologic: pupils equal reactive to light, optic disc sharp, vessels normal, motor strength 5 over 5, reflexes absent symmetric, sensation intact to vibration over the fingertips by tuning fork exam Skin: healing rash lower extremities punctate lesions where there were previously blisters now around, hyperpigmented areas. No rash on the trunk or upper extremities.     Lab Results: Lab Results  Component Value Date   WBC 12.1* 10/09/2011   HGB 9.9* 10/09/2011   HCT 29.2* 10/09/2011   MCV 70.9* 10/09/2011   PLT 397 10/09/2011     Chemistry      Component Value Date/Time   NA 141 09/08/2011 0704   K 3.8 09/08/2011 0704   CL 106 09/08/2011 0704     CO2 25 09/08/2011 0704   BUN 21 09/08/2011 0704   CREATININE 1.45* 09/08/2011 0704      Component Value Date/Time   CALCIUM 8.5 09/08/2011 0704   ALKPHOS 68 09/07/2011 0635   AST 9 09/07/2011 0635   ALT 10 09/07/2011 0635   BILITOT 0.2* 09/07/2011 0635      PTT 42.6 seconds  lab normal up to 37 (he is on Coumadin which can also affect the PTT) Most recent INR on current Coumadin dose was 2.0 on July 26   Pathology: C. skin biopsy results above    Review of peripheral blood film: normochromic microcytic red cells 2+ spherocytes, no polychromasia, 1+ rouleaux, mature neutrophils and lymphocytes with occasional benign reactive lymphocyte. Normal platelets    Radiological Studies: Echocardiogram done July 24 showed a left ventricular ejection fraction 60-65% with mild concentric hypertrophy. No valve abnormalities.     Impression and Plan: Recent, acute, bilateral lower extremity DVTs occurring in the context of an acute, and idiopathic, bullous skin eruption with associated cellulitis. Skin biopsy showing vasculitis. Positive lupus-type anticoagulant. Negative antibodies to anticardiolipin and beta 2 glycoprotein 1.  No signs or symptoms of a collagen vascular disorder and anti-double strand DNA negative.    There are a number of things that concern me. Although it is hard to know what happened clinically on the day that different laboratory values were recorded, a CBC done in February 2011 showed an elevated white count 11,800, in March 2013 white count was 15,000. On both of those occasions hemoglobin was 13 with MCV 79.5 and 74 on repeat. When he was put on steroids in the hospital white count jumped to 29,000 and is down to 12,000 today. All of his white count differential showed a shift to the left with a predominance of mature neutrophils. However based on current available data I cannot exclude the possibility of a myeloproliferative disorder. Urinalysis done in the hospital in July was positive  for blood but negative for obvious infection. He has had intermittent low grade hematochezia. I think that we have to consider occult malignancy in the differential.   Plan: #1 in view of his complaint of air hunger and recent DVTs I am going to get a CT angiogram of the chest to see if he had a pulmonary embolus. #2. When he comes back for the CT I will obtain a urine specimen to see if the heme positive urine is reproducible. If so he will need further urologic evaluation. #3. With respect to his microcytosis, he may have a thalassemia trait. No obvious sign of hemolysis. Bilirubin checked in the hospital was 0.2 and reticulocyte count is normal. #4. Low grade hematochezia. I think that he needs to have a colonoscopy to make sure he does not have an underlying malignancy. I'm going to make a referral to Dr. Loreta Ave and Elnoria Howard. #5. Leukocytosis This appears to be chronic. He may have an early myeloproliferative disorder. This could also be a nonspecific sign of early malignancy. If we exclude malignancy, and white count remains elevated, I will do further special hematology testing (BCR -ABL and a JAK-2 analysis) #6. Positive lupus-type anticoagulant. My clinical feeling is that this was probably transiently elevated in view of the acute cellulitis/vasculitis. As noted above, he tests negative for anticardiolipin and anti-beta 2 glycoprotein 1 antibodies.  I am repeating the test again today. If he has persistent positive test, long-term anticoagulation will be indicated. 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 We will need to follow him over time.      Levert Feinstein, MD 10/09/2011, 5:29 PM

## 2011-10-09 NOTE — Progress Notes (Signed)
Patient states that when he was on prednisone he did have the "start of panic attacks"  But he is better now that he is off the prednisone.  He is in counseling through his wife's work.  He does feel stressed that he is unable to work.

## 2011-10-10 ENCOUNTER — Encounter (HOSPITAL_COMMUNITY): Payer: Self-pay

## 2011-10-10 ENCOUNTER — Ambulatory Visit (HOSPITAL_COMMUNITY)
Admission: RE | Admit: 2011-10-10 | Discharge: 2011-10-10 | Disposition: A | Discharge status: Home / Self Care | Payer: 59 | Source: Ambulatory Visit | Attending: Oncology | Admitting: Oncology

## 2011-10-10 ENCOUNTER — Other Ambulatory Visit (HOSPITAL_BASED_OUTPATIENT_CLINIC_OR_DEPARTMENT_OTHER): Payer: 59

## 2011-10-10 ENCOUNTER — Telehealth: Payer: Self-pay | Admitting: Oncology

## 2011-10-10 DIAGNOSIS — I82409 Acute embolism and thrombosis of unspecified deep veins of unspecified lower extremity: Secondary | ICD-10-CM

## 2011-10-10 DIAGNOSIS — I82403 Acute embolism and thrombosis of unspecified deep veins of lower extremity, bilateral: Secondary | ICD-10-CM

## 2011-10-10 DIAGNOSIS — R05 Cough: Secondary | ICD-10-CM | POA: Insufficient documentation

## 2011-10-10 DIAGNOSIS — R0602 Shortness of breath: Secondary | ICD-10-CM | POA: Insufficient documentation

## 2011-10-10 DIAGNOSIS — R0600 Dyspnea, unspecified: Secondary | ICD-10-CM

## 2011-10-10 DIAGNOSIS — R059 Cough, unspecified: Secondary | ICD-10-CM | POA: Insufficient documentation

## 2011-10-10 DIAGNOSIS — R0609 Other forms of dyspnea: Secondary | ICD-10-CM

## 2011-10-10 DIAGNOSIS — K921 Melena: Secondary | ICD-10-CM

## 2011-10-10 DIAGNOSIS — Z7901 Long term (current) use of anticoagulants: Secondary | ICD-10-CM | POA: Insufficient documentation

## 2011-10-10 LAB — URINALYSIS, MICROSCOPIC - CHCC
Leukocyte Esterase: NEGATIVE
Protein: 100 mg/dL
pH: 6.5 (ref 4.6–8.0)

## 2011-10-10 MED ORDER — IOHEXOL 350 MG/ML SOLN
100.0000 mL | Freq: Once | INTRAVENOUS | Status: AC | PRN
  Administered 2011-10-10: 100 mL via INTRAVENOUS

## 2011-10-10 NOTE — Telephone Encounter (Signed)
Faxed records to Dr. Loreta Ave office.

## 2011-10-10 NOTE — Telephone Encounter (Signed)
Pt was given appts for lb/ct to be done 8/8 prior to leaving yesterday. Message was also left for [facility] 8/7 re auth for ct pt was having 8/8. Pt came back today for sept schedule and appt w/Dr. Loreta Ave. Pt was given schedule and informed I would call w/appt for Dr. Loreta Ave. I s/w Lisa @ Dr. Kenna Gilbert office this morning and notes were sent for review. Received return call from Misty Stanley stating that after reviewing notes Dr. Loreta Ave cannot she pt. email sent to [facility] to advise re GI referral.

## 2011-10-11 ENCOUNTER — Telehealth: Payer: Self-pay | Admitting: *Deleted

## 2011-10-11 ENCOUNTER — Ambulatory Visit: Payer: 59 | Admitting: Family Medicine

## 2011-10-11 LAB — LUPUS ANTICOAGULANT PANEL
DRVVT: 50.7 secs — ABNORMAL HIGH (ref <45.1)
Lupus Anticoagulant: NOT DETECTED

## 2011-10-11 LAB — D-DIMER, QUANTITATIVE: D-Dimer, Quant: 1.73 ug/mL-FEU — ABNORMAL HIGH (ref 0.00–0.48)

## 2011-10-11 NOTE — Telephone Encounter (Signed)
Pt notified of CT Angio report per Dr. Cyndie Chime, "No blood clots in lungs"  Pt very happy.

## 2011-10-11 NOTE — Telephone Encounter (Signed)
Message copied by Sabino Snipes on Fri Oct 11, 2011  5:30 PM ------      Message from: Levert Feinstein      Created: Fri Oct 11, 2011  2:11 PM       Call pt no blood clots in lungs

## 2011-10-14 ENCOUNTER — Ambulatory Visit: Payer: 59 | Admitting: Family Medicine

## 2011-10-17 ENCOUNTER — Encounter: Payer: Self-pay | Admitting: Family Medicine

## 2011-10-17 ENCOUNTER — Encounter: Payer: Self-pay | Admitting: Gastroenterology

## 2011-10-17 ENCOUNTER — Ambulatory Visit (INDEPENDENT_AMBULATORY_CARE_PROVIDER_SITE_OTHER): Payer: 59 | Admitting: Family Medicine

## 2011-10-17 ENCOUNTER — Other Ambulatory Visit: Payer: Self-pay | Admitting: *Deleted

## 2011-10-17 VITALS — BP 132/80 | HR 102 | Temp 98.3°F | Wt 359.4 lb

## 2011-10-17 DIAGNOSIS — D509 Iron deficiency anemia, unspecified: Secondary | ICD-10-CM

## 2011-10-17 DIAGNOSIS — R319 Hematuria, unspecified: Secondary | ICD-10-CM

## 2011-10-17 DIAGNOSIS — K625 Hemorrhage of anus and rectum: Secondary | ICD-10-CM

## 2011-10-17 DIAGNOSIS — I82409 Acute embolism and thrombosis of unspecified deep veins of unspecified lower extremity: Secondary | ICD-10-CM

## 2011-10-17 MED ORDER — POLYSACCHARIDE IRON COMPLEX 150 MG PO CAPS: 150.0000 mg | ORAL_CAPSULE | Freq: Two times a day (BID) | ORAL | Status: DC

## 2011-10-17 NOTE — Patient Instructions (Signed)
Coumadin  -----    INR today 1.5                    Increase coumadin----take extra 1/2 tab today then take 2 tabs daily and 2 1/2 tabs Mondays.   Recheck in 1 week

## 2011-10-17 NOTE — Progress Notes (Addendum)
  Subjective:    Patient ID: Parker West, male    DOB: 1970/07/14, 41 y.o.   MRN: [id number]  HPI Pt here f/u dvt and hematology.   Dr Cyndie Chime advised pt sees urology for hematuria and GI for rectal bleed and anemia. Pt c/o fatigue.  Hematology started him on iron.        Review of Systems as above   Objective:   Physical Exam  Constitutional: He is oriented to person, place, and time. He appears well-developed and well-nourished.  Neurological: He is alert and oriented to person, place, and time.  Psychiatric: He has a normal mood and affect. His behavior is normal. Judgment and thought content normal.          Assessment & Plan:  DVT--  Coumadin 7.5 mg  2 tab daily 2 1/2 tabs on Monday----recheck 1 week Anemia ---  Iron per hematology ,  Refer to GI for rectal bleed Hematuria-- refer to urology per hematology

## 2011-10-17 NOTE — Progress Notes (Signed)
  Subjective:    Patient ID: Parker West, male    DOB: Feb 04, 1971, 41 y.o.   MRN: [id number]  HPI    Review of Systems     Objective:   Physical Exam   ----     Assessment & Plan:

## 2011-10-17 NOTE — Addendum Note (Signed)
Addended by: Lelon Perla on: 10/17/2011 03:34 PM   Modules accepted: Level of Service

## 2011-10-19 ENCOUNTER — Encounter: Payer: Self-pay | Admitting: Family Medicine

## 2011-10-21 MED ORDER — AMLODIPINE BESYLATE 10 MG PO TABS: 10.0000 mg | ORAL_TABLET | Freq: Every day | ORAL | Status: DC

## 2011-10-21 MED ORDER — WARFARIN SODIUM 7.5 MG PO TABS: ORAL_TABLET | ORAL | Status: DC

## 2011-10-28 ENCOUNTER — Ambulatory Visit: Payer: 59 | Admitting: Family Medicine

## 2011-10-28 DIAGNOSIS — Z0289 Encounter for other administrative examinations: Secondary | ICD-10-CM

## 2011-11-12 ENCOUNTER — Other Ambulatory Visit: Payer: 59 | Admitting: Lab

## 2011-11-12 ENCOUNTER — Telehealth: Payer: Self-pay | Admitting: Oncology

## 2011-11-12 ENCOUNTER — Ambulatory Visit: Payer: 59 | Admitting: Nurse Practitioner

## 2011-11-12 NOTE — Telephone Encounter (Signed)
pt called to cx 9/10 appt as he had am emer. lm for his to c/b and r/s     aom

## 2011-11-14 ENCOUNTER — Ambulatory Visit (INDEPENDENT_AMBULATORY_CARE_PROVIDER_SITE_OTHER): Payer: 59 | Admitting: Gastroenterology

## 2011-11-14 ENCOUNTER — Encounter: Payer: Self-pay | Admitting: Gastroenterology

## 2011-11-14 VITALS — BP 140/100 | HR 96 | Ht 71.0 in | Wt 364.1 lb

## 2011-11-14 DIAGNOSIS — E669 Obesity, unspecified: Secondary | ICD-10-CM

## 2011-11-14 DIAGNOSIS — R195 Other fecal abnormalities: Secondary | ICD-10-CM

## 2011-11-14 DIAGNOSIS — Z7901 Long term (current) use of anticoagulants: Secondary | ICD-10-CM

## 2011-11-14 DIAGNOSIS — K921 Melena: Secondary | ICD-10-CM

## 2011-11-14 NOTE — Patient Instructions (Addendum)
You will be contacted by [facility] for a consult with them. We will contact you about your Virtual CT after we have checked with your insurance.  CC: Loreen Freud, M. D.

## 2011-11-14 NOTE — Progress Notes (Signed)
History of Present Illness:  This is a massively obese 41 year old black male with recent deep vein thrombosis and possible pulmonary emboli. He is been on Coumadin now for 6 weeks. He is referred because of iron deficiency anemia with periodic rectal bleeding over the last several months. He suffers from hypertension, and was recently treated for an unknown skin infection. Family history remarkable for colon cancer in great aunt. The patient has consider previous bariatric surgery but could not afford this procedure. He has not had previous barium studies or colonoscopy exams. His obesity keeps him from being very active, and he has peripheral edema and stasis dermatitis. He was recently started on iron therapy by Dr. Cyndie Chime in hematology. Patient denies upper GI or hepatobiliary complaints.  I have reviewed this patient's present history, medical and surgical past history, allergies and medications.     ROS: The remainder of the 10 point ROS is negative     Physical Exam: Morbidly obese patient who is in no acute distress and sitting position. His blood pressure is 1 4100, pulse 96, and weight 364 pounds with a BMI of 50.79. General well developed well nourished patient in no acute distress, appearing their stated age Eyes PERRLA, no icterus, fundoscopic exam per opthamologist Skin no lesions noted Neck supple, no adenopathy, no thyroid enlargement, no tenderness Chest clear to percussion and auscultation Heart no significant murmurs, gallops or rubs noted Abdomen no hepatosplenomegaly masses or tenderness, BS normal. Abdominal exam difficult because of massive obesity.  Rectal inspection normal no fissures, or fistulae noted.  No masses or tenderness on digital exam. Stool guaiac positive. Extremities no acute joint lesions, +1 edema with healing nodular skin lesion on his anterior shins. Neurologic patient oriented x 3, cranial nerves intact, no focal neurologic deficits  noted. Psychological mental status normal and normal affect.  Assessment and plan: This patient is not a colonoscopy candidate because of his massive obesity and anticoagulation. I cannot see where he can stop Coumadin for several months, and he does not fit criteria to have conscious sedation in our or have a BMI a endoscopy Center. I will set him up for CT colonoscopy, have referred him to [facility] dietitian for dietary counseling, and I still think that he should be considered for bariatric surgery in the future because of his young age and massive obesity. Depending on results of his CT colonoscopy and clinical course, he may need referral for hospitalization and general anesthesia and intra-hospital control of his anticoagulation status. I've advised him to continue other medications as listed and reviewed.  No diagnosis found.

## 2011-11-19 ENCOUNTER — Encounter: Payer: Self-pay | Admitting: *Deleted

## 2011-11-19 ENCOUNTER — Encounter: Payer: 59 | Attending: Gastroenterology | Admitting: *Deleted

## 2011-11-19 VITALS — Ht 71.0 in | Wt 369.7 lb

## 2011-11-19 DIAGNOSIS — Z713 Dietary counseling and surveillance: Secondary | ICD-10-CM | POA: Insufficient documentation

## 2011-11-19 DIAGNOSIS — E669 Obesity, unspecified: Secondary | ICD-10-CM

## 2011-11-19 NOTE — Progress Notes (Signed)
  Medical Nutrition Therapy:  Appt start time: 1500 end time:  1600.   Assessment:  Primary concerns today: obesity.   MEDICATIONS: see list   DIETARY INTAKE:  Usual eating pattern includes 1-3 meals and 1-3 snacks per day.  Everyday foods include fast food, fatty meats, refined grains, some vegetables, sugary beverages, and energy-dense snack foods.  Avoided foods include none.    24-hr recall:  B ( AM): steak, egg, and cheese mcgriddle with orange juice or may skip Snk ( AM): munchos with cheerwine  L ( PM): may have fried chicken with fries and drink or may skip Snk ( PM): munchos or something similiar D ( PM): wife cooks dinner- meat, starch, vegetable Snk ( PM): none Beverages: soda  Usual physical activity:  Just started at [facility] swimming 3 days a week Currently not working  Estimated energy needs: 2200 calories 248 g carbohydrates 156 g protein 61 g fat  Progress Towards Goal(s):  In progress.   Nutritional Diagnosis:  [place]-3.3 Overweight/obesity As related to large portions of energy-dense foods and beverages, coupled with limited physical activity.  As evidenced by BMI of 51.4.    Intervention:  Nutrition counseling provided. D[place] is here for weight loss education.  He is very motivated to loose weight.  He is interested in bariatric surgery.  Gave informational brochure on the bariatric surgery program available through [facility] and suggested her attend a free informational seminar to learn more.  [place] just started swimming at the [facility] 3 days/week.  Encouraged him to continue this practice for a month.  Currently he is not actively working and has more free time to prepare meals.  He typically eats out for breakfast and lunch, but encouraged him to discontinue this practice.  Reviewed what are calories and how many calories are recommended for him vs how many calories are in his favorite fast foods and beverages.  Discussed MyPlate recommendations for meal planning:  lean proteins, complex carbohydrates, and more non-starchy vegetables.  Also encouraged more water.  Dreshawn agreed to limit sodas to 2/day and drink 2 glasses of water/day.  Asked Gabriela to focus more on balancing his meal (eating more vegetables, and less fat and refined grains) and to cook at home.   Encouraged baking, broiling, grilling, not frying.  Handouts given during visit include:  MyPlate  Monitoring/Evaluation:  Dietary intake, exercise, and body weight in 1 month(s).

## 2011-11-19 NOTE — Patient Instructions (Addendum)
Diet ocean spray and diet v8 splash Limit soda to 2 times a day and replace other 2 sodas with water (mix in sugar-free favoring) Continue going to [facility] 3 days/week for 1 month Follow MyPlate recommendations for meal planning- aim to eat most meals at home

## 2011-11-22 ENCOUNTER — Other Ambulatory Visit: Payer: Self-pay | Admitting: Family Medicine

## 2011-11-23 ENCOUNTER — Other Ambulatory Visit: Payer: Self-pay | Admitting: Family Medicine

## 2011-11-25 MED ORDER — WARFARIN SODIUM 7.5 MG PO TABS: ORAL_TABLET | ORAL | Status: DC

## 2011-12-14 ENCOUNTER — Other Ambulatory Visit: Payer: Self-pay | Admitting: Internal Medicine

## 2011-12-17 ENCOUNTER — Other Ambulatory Visit: Payer: Self-pay | Admitting: Family Medicine

## 2011-12-17 NOTE — Telephone Encounter (Signed)
Refill done.  

## 2011-12-23 ENCOUNTER — Ambulatory Visit: Payer: 59 | Admitting: *Deleted

## 2012-01-03 ENCOUNTER — Telehealth: Payer: Self-pay | Admitting: Family Medicine

## 2012-01-03 ENCOUNTER — Ambulatory Visit: Payer: 59 | Admitting: Family Medicine

## 2012-01-03 MED ORDER — WARFARIN SODIUM 7.5 MG PO TABS: ORAL_TABLET | ORAL | Status: DC

## 2012-01-03 NOTE — Telephone Encounter (Signed)
Pt states he is out of his coumadin. He uses [facility] on [address].

## 2012-01-03 NOTE — Telephone Encounter (Signed)
Rx faxed.    KP 

## 2012-01-06 ENCOUNTER — Ambulatory Visit: Payer: 59 | Admitting: Family Medicine

## 2012-01-06 DIAGNOSIS — Z0289 Encounter for other administrative examinations: Secondary | ICD-10-CM

## 2012-01-08 ENCOUNTER — Ambulatory Visit: Payer: 59 | Admitting: *Deleted

## 2012-01-09 ENCOUNTER — Encounter: Payer: Self-pay | Admitting: Family Medicine

## 2012-01-09 ENCOUNTER — Encounter (INDEPENDENT_AMBULATORY_CARE_PROVIDER_SITE_OTHER): Payer: 59

## 2012-01-09 ENCOUNTER — Ambulatory Visit (INDEPENDENT_AMBULATORY_CARE_PROVIDER_SITE_OTHER): Payer: 59 | Admitting: Family Medicine

## 2012-01-09 VITALS — BP 146/98 | HR 117 | Temp 98.3°F | Wt 376.0 lb

## 2012-01-09 DIAGNOSIS — I82409 Acute embolism and thrombosis of unspecified deep veins of unspecified lower extremity: Secondary | ICD-10-CM

## 2012-01-09 DIAGNOSIS — R609 Edema, unspecified: Secondary | ICD-10-CM

## 2012-01-09 DIAGNOSIS — I10 Essential (primary) hypertension: Secondary | ICD-10-CM

## 2012-01-09 DIAGNOSIS — I8010 Phlebitis and thrombophlebitis of unspecified femoral vein: Secondary | ICD-10-CM

## 2012-01-09 MED ORDER — WARFARIN SODIUM 7.5 MG PO TABS: ORAL_TABLET | ORAL | Status: DC

## 2012-01-09 MED ORDER — FUROSEMIDE 40 MG PO TABS: 40.0000 mg | ORAL_TABLET | Freq: Every day | ORAL | Status: DC

## 2012-01-09 MED ORDER — LOSARTAN POTASSIUM 100 MG PO TABS: 100.0000 mg | ORAL_TABLET | Freq: Every day | ORAL | Status: DC

## 2012-01-09 NOTE — Progress Notes (Signed)
  Subjective:     Parker West is a 41 y.o. male who presents for evaluation of swelling and pain of both legs. Symptoms have been present for several weeks. He has had similar problems in the past. The patient is able to ambulate. Risk factors for hypercoagulable state include: obesity, prior venous thromboembolism, prolonged sitting during travel and recent hospitalization.  The following portions of the patient's history were reviewed and updated as appropriate: allergies, current medications, past family history, past medical history, past social history, past surgical history and problem list.  Review of Systems Pertinent items are noted in HPI.    Objective:    BP 146/98  Pulse 117  Temp 98.3 F (36.8 C) (Oral)  Wt 376 lb (170.552 kg)  SpO2 98% General appearance: alert, cooperative, appears stated age and no distress Lungs: clear to auscultation bilaterally Extremities: edema + 1 pitting edema  and calf pain    Assessment:    + evidence of deep vein thrombosis. ---pt stopped taking all of his meds  htn--  Refill cozaar Edema---  Refill lasix , elevated legs Plan:    dopplers today  Restart coumadin Rest, elevated legs D/w pt importance of taking meds and getting Inr checked Reschedule heme appointment

## 2012-01-09 NOTE — Patient Instructions (Signed)
Deep Vein Thrombosis  A deep vein thrombosis (DVT) is a blood clot that develops in a deep vein. A DVT is a clot in the deep, larger veins of the leg, arm, or pelvis. These are more dangerous than clots that might form in veins near the surface of the body. A DVT can lead to complications if the clot breaks off and travels in the bloodstream to the lungs.   A DVT can damage the valves in your leg veins, so that instead of flowing upwards, the blood pools in the lower leg. This is called post-thrombotic syndrome, and can result in pain, swelling, discoloration, and sores on the leg.  Once identified, a DVT can be treated. It can also be prevented in some circumstances. Once you have had a DVT, you may be at increased risk for a DVT in the future.  CAUSES  Blood clots form in a vein for different reasons. Usually several things contribute to blood clots. Contributing factors include:   The flow of blood slows down.   The inside of the vein is damaged in some way.   The person has a condition that makes blood clot more easily.  Some people are more likely than others to develop blood clots. That is because they have more factors that make clots likely. These are called risk factors. Risk factors include:    Older age, especially over 75 years old.   Having a history of blood clots. This means you have had one before. Or, it means that someone else in your family has had blood clots. You may have a genetic tendency to form clots.   Having major or lengthy surgery. This is especially true for surgery on the hip, knee, or belly (abdomen). Hip surgery is particularly high risk.   Breaking a hip or leg.   Sitting or lying still for a long time. This includes long distance travel, paralysis, or recovery from an illness or surgery.   Cancer, or cancer treatment.   Having a long, thin tube (catheter) placed inside a vein during a medical procedure.   Being overweight (obese).   Pregnancy and childbirth. Hormone  changes make the blood clot more easily during pregnancy. The fetus puts pressure on the veins of the pelvis. There is also risk of injury to veins during delivery or a caesarean. The risk is at its highest just after childbirth.   Medicines with the male hormone estrogen. This includes birth control pills and hormone replacement therapy.   Smoking.   Other circulation or heart problems.  SYMPTOMS  When a clot forms, it can either partially or totally block the blood flow in that vein. Symptoms of a DVT can include:   Swelling of the leg or arm, especially if one side is much worse.   Warmth and redness of the leg or arm, especially if one side is much worse.   Pain in an arm or leg. If the clot is in the leg, symptoms may be more noticeable or worse when standing or walking.  The symptoms of a DVT that has traveled to the lungs (pulmonary embolism, PE) usually start suddenly, and include:   Shortness of breath.   Coughing.   Coughing up blood or blood-tinged phlegm.   Chest pain. The chest pain is often worse with deep breaths.   Rapid heartbeat.  Anyone with these symptoms should get emergency medical treatment right away. Call your local emergency services 911 in [place] if you have these symptoms.    the clotting properties of the blood.  Ultrasonography to see if you have clots in your legs or lungs.  X-rays to show the flow of blood when dye is injected into the veins (venography).  Studies of your lungs, if you have any chest symptoms. PREVENTION  Exercise the legs regularly. Take a brisk 30 minute walk every day.  Maintain a weight that is appropriate for your height.  Avoid sitting or lying in bed for long periods of time without moving your legs.  Women, particularly those over the age of 35,  should consider the risks and benefits of taking estrogen medicines, including birth control pills.  Do not smoke, especially if you take estrogen medicines.  Long distance travel can increase your risk of DVT. You should exercise your legs by walking or pumping the muscles every hour.  In-hospital prevention:  Many of the risk factors above relate to situations that exist with hospitalization, either for illness, injury, or elective surgery.  Your caregiver will assess you for the need for venous thromboembolism prophylaxis when you are admitted to the hospital. If you are having surgery, your surgeon will assess you the day of or day after surgery.  Prevention may include medical and nonmedical measures. TREATMENT Treatment for DVT helps prevent death and disability. The most common treatment for DVT is blood thinning (anticoagulant) medicine, which reduces the blood's tendency to clot. Anticoagulants can stop new blood clots from forming and old ones from growing. They cannot dissolve existing clots. Your body does this by itself over time. Anticoagulants can be given by mouth, by intravenous (IV) access, or by injection. Your caregiver will determine the best program for you.  Heparin or related medicines (low molecular weight heparin) are usually the first treatment for a blood clot. They act quickly. However, they cannot be taken orally.  Heparin can cause a fall in a component of blood that stops bleeding and forms blood clots (platelets). You will be monitored with blood tests to be sure this does not occur.  Warfarin is an anticoagulant that can be swallowed (taken orally). It takes a few days to start working, so usually heparin or related medicines are used in combination. Once warfarin is working, heparin is usually stopped.  Less commonly, clot dissolving drugs (thrombolytics) are used to dissolve a DVT. They carry a high risk of bleeding, so they are used mainly in severe cases,  where a life or limb is threatened.  Very rarely, a blood clot in the leg needs to be removed surgically.  If you are unable to take anticoagulants, your caregiver may arrange for you to have a filter placed in a main vein in your belly (abdomen). This filter prevents clots from traveling to your lungs. HOME CARE INSTRUCTIONS  Take all medicines prescribed by your caregiver. Follow the directions carefully.  Warfarin. Most people will continue taking warfarin after hospital discharge. Your caregiver will advise you on the length of treatment (usually 3 6 months, sometimes lifelong).  Too much and too little warfarin are both dangerous. Too much warfarin increases the risk of bleeding. Too little warfarin continues to allow the risk for blood clots. While taking warfarin, you will need to have regular blood tests to measure your blood clotting time. These blood tests usually include both the prothrombin time (PT) and international normalized ratio (INR) tests. The PT and INR results allow your caregiver to adjust your dose of warfarin. The dose can change for many reasons. It is critically important that   blood clots. While taking warfarin, you will need to have regular blood tests to measure your blood clotting time. These blood tests usually include both the prothrombin time (PT) and international normalized ratio (INR) tests. The PT and INR results allow your caregiver to adjust your dose of warfarin. The dose can change for many reasons. It is critically important that you take warfarin exactly as prescribed, and that you have your PT and INR levels drawn exactly as directed.   Many foods, especially foods high in vitamin K can interfere with warfarin and affect the PT and INR results. Foods high in vitamin K include spinach, kale, broccoli, cabbage, collard and turnip greens, brussels sprouts, peas, cauliflower, seaweed, and parsley as well as beef and pork liver, green tea, and soybean oil. You should eat a consistent amount of foods high in vitamin K. Avoid major changes in your diet, or notify your caregiver before changing your diet. Arrange a visit with a dietitian to answer your questions.   Many medicines can interfere with warfarin and affect the PT and INR results. You must tell your caregiver about any and all medicines you take, this includes all vitamins  and supplements. Be especially cautious with aspirin and anti-inflammatory medicines. Ask your caregiver before taking these. Do not take or discontinue any prescribed or over-the-counter medicine except on the advice of your caregiver or pharmacist.   Warfarin can have side effects, primarily excessive bruising or bleeding. You will need to hold pressure over cuts for longer than usual. Your caregiver or pharmacist will discuss other potential side effects.   Alcohol can change the body's ability to handle warfarin. It is best to avoid alcoholic drinks or consume only very small amounts while taking warfarin. Notify your caregiver if you change your alcohol intake.   Notify your dentist or other caregivers before procedures.   Activity. Ask your caregiver how soon you can go back to normal activities. It is important to stay active to prevent blood clots. If you are on anticoagulant medicine, avoid contact sports.   Exercise. It is very important to exercise. This is especially important while traveling, sitting or standing for long periods of time. Exercise your legs by walking or by pumping the muscles frequently. Take frequent walks.   Compression stockings. These are tight elastic stockings that apply pressure to the lower legs. This pressure can help keep the blood in the legs from clotting. You may need to wear compressions stockings at home to help prevent a DVT.   Smoking. If you smoke, quit. Ask your caregiver for help with quitting smoking.   Learn as much as you can about DVT. Knowing more about the condition should help you keep it from coming back.   Wear a medical alert bracelet or carry a medical alert card.  SEEK MEDICAL CARE IF:   You notice a rapid heartbeat.   You feel weaker or more tired than usual.   You feel faint.   You notice increased bruising.   You feel your symptoms are not getting better in the time expected.   You believe you are having side effects of medicine.  SEEK  IMMEDIATE MEDICAL CARE IF:   You have chest pain.   You have trouble breathing.   You have new or increased swelling or pain in one leg.   You cough up blood.   You notice blood in vomit, in a bowel movement, or in urine.  MAKE SURE YOU:   Understand these instructions.  

## 2012-01-18 ENCOUNTER — Other Ambulatory Visit: Payer: Self-pay | Admitting: Family Medicine

## 2012-04-18 ENCOUNTER — Other Ambulatory Visit: Payer: Self-pay

## 2012-06-03 ENCOUNTER — Encounter: Payer: Self-pay | Admitting: Lab

## 2012-06-04 ENCOUNTER — Ambulatory Visit: Payer: 59 | Admitting: Family Medicine

## 2012-06-15 ENCOUNTER — Encounter: Payer: Self-pay | Admitting: Lab

## 2012-06-16 ENCOUNTER — Ambulatory Visit: Payer: 59 | Admitting: Family Medicine

## 2012-06-16 DIAGNOSIS — Z0289 Encounter for other administrative examinations: Secondary | ICD-10-CM

## 2013-01-07 ENCOUNTER — Other Ambulatory Visit: Payer: Self-pay

## 2013-02-15 ENCOUNTER — Other Ambulatory Visit: Payer: Self-pay | Admitting: Family Medicine

## 2013-04-10 ENCOUNTER — Other Ambulatory Visit: Payer: Self-pay | Admitting: Family Medicine

## 2014-02-01 ENCOUNTER — Encounter (HOSPITAL_COMMUNITY): Payer: Self-pay | Admitting: *Deleted

## 2014-02-01 ENCOUNTER — Emergency Department (HOSPITAL_COMMUNITY)
Admission: EM | Admit: 2014-02-01 | Discharge: 2014-02-01 | Disposition: A | Discharge status: Home / Self Care | Payer: 59 | Attending: Emergency Medicine | Admitting: Emergency Medicine

## 2014-02-01 ENCOUNTER — Emergency Department (HOSPITAL_COMMUNITY): Payer: 59

## 2014-02-01 DIAGNOSIS — E011 Iodine-deficiency related multinodular (endemic) goiter: Secondary | ICD-10-CM | POA: Diagnosis not present

## 2014-02-01 DIAGNOSIS — M13871 Other specified arthritis, right ankle and foot: Secondary | ICD-10-CM | POA: Diagnosis not present

## 2014-02-01 DIAGNOSIS — Z7952 Long term (current) use of systemic steroids: Secondary | ICD-10-CM | POA: Diagnosis not present

## 2014-02-01 DIAGNOSIS — Z86718 Personal history of other venous thrombosis and embolism: Secondary | ICD-10-CM | POA: Insufficient documentation

## 2014-02-01 DIAGNOSIS — Z8701 Personal history of pneumonia (recurrent): Secondary | ICD-10-CM | POA: Insufficient documentation

## 2014-02-01 DIAGNOSIS — Z8619 Personal history of other infectious and parasitic diseases: Secondary | ICD-10-CM | POA: Diagnosis not present

## 2014-02-01 DIAGNOSIS — Z79899 Other long term (current) drug therapy: Secondary | ICD-10-CM | POA: Insufficient documentation

## 2014-02-01 DIAGNOSIS — E669 Obesity, unspecified: Secondary | ICD-10-CM | POA: Diagnosis not present

## 2014-02-01 DIAGNOSIS — R7989 Other specified abnormal findings of blood chemistry: Secondary | ICD-10-CM | POA: Insufficient documentation

## 2014-02-01 DIAGNOSIS — Z7901 Long term (current) use of anticoagulants: Secondary | ICD-10-CM | POA: Insufficient documentation

## 2014-02-01 DIAGNOSIS — I10 Essential (primary) hypertension: Secondary | ICD-10-CM | POA: Insufficient documentation

## 2014-02-01 DIAGNOSIS — I209 Angina pectoris, unspecified: Secondary | ICD-10-CM | POA: Diagnosis not present

## 2014-02-01 DIAGNOSIS — M19079 Primary osteoarthritis, unspecified ankle and foot: Secondary | ICD-10-CM

## 2014-02-01 DIAGNOSIS — Z8719 Personal history of other diseases of the digestive system: Secondary | ICD-10-CM | POA: Insufficient documentation

## 2014-02-01 DIAGNOSIS — M79674 Pain in right toe(s): Secondary | ICD-10-CM

## 2014-02-01 DIAGNOSIS — D509 Iron deficiency anemia, unspecified: Secondary | ICD-10-CM | POA: Diagnosis not present

## 2014-02-01 LAB — I-STAT CREATININE, ED: Creatinine, Ser: 1.7 mg/dL — ABNORMAL HIGH (ref 0.50–1.35)

## 2014-02-01 MED ORDER — HYDROCODONE-ACETAMINOPHEN 5-325 MG PO TABS
2.0000 | ORAL_TABLET | Freq: Once | ORAL | Status: AC
Start: 2014-02-01 — End: 2014-02-01
  Administered 2014-02-01: 2 via ORAL
  Filled 2014-02-01: qty 2

## 2014-02-01 MED ORDER — PREDNISONE 50 MG PO TABS: ORAL_TABLET | ORAL | Status: DC

## 2014-02-01 MED ORDER — HYDROCODONE-ACETAMINOPHEN 5-325 MG PO TABS: 1.0000 | ORAL_TABLET | ORAL | Status: DC | PRN

## 2014-02-01 NOTE — Discharge Instructions (Signed)
Take prednisone as prescribed and Norco as needed for severe pain. Wear a post op shoe for comfort. Follow up with your primary doctor for further evaluation of symptoms and to recheck your kidney function. Do NOT take ibuprofen, motrin, Aleve, Naproxen, or any other NSAIDs as they may worsen your kidney function. Return as needed if symptoms worsen.  Arthritis, Nonspecific Arthritis is inflammation of a joint. This usually means pain, redness, warmth or swelling are present. One or more joints may be involved. There are a number of types of arthritis. Your caregiver may not be able to tell what type of arthritis you have right away. CAUSES  The most common cause of arthritis is the wear and tear on the joint (osteoarthritis). This causes damage to the cartilage, which can break down over time. The knees, hips, back and neck are most often affected by this type of arthritis. Other types of arthritis and common causes of joint pain include:  Sprains and other injuries near the joint. Sometimes minor sprains and injuries cause pain and swelling that develop hours later.  Rheumatoid arthritis. This affects hands, feet and knees. It usually affects both sides of your body at the same time. It is often associated with chronic ailments, fever, weight loss and general weakness.  Crystal arthritis. Gout and pseudo gout can cause occasional acute severe pain, redness and swelling in the foot, ankle, or knee.  Infectious arthritis. Bacteria can get into a joint through a break in overlying skin. This can cause infection of the joint. Bacteria and viruses can also spread through the blood and affect your joints.  Drug, infectious and allergy reactions. Sometimes joints can become mildly painful and slightly swollen with these types of illnesses. SYMPTOMS   Pain is the main symptom.  Your joint or joints can also be red, swollen and warm or hot to the touch.  You may have a fever with certain types of  arthritis, or even feel overall ill.  The joint with arthritis will hurt with movement. Stiffness is present with some types of arthritis. DIAGNOSIS  Your caregiver will suspect arthritis based on your description of your symptoms and on your exam. Testing may be needed to find the type of arthritis:  Blood and sometimes urine tests.  X-ray tests and sometimes CT or MRI scans.  Removal of fluid from the joint (arthrocentesis) is done to check for bacteria, crystals or other causes. Your caregiver (or a specialist) will numb the area over the joint with a local anesthetic, and use a needle to remove joint fluid for examination. This procedure is only minimally uncomfortable.  Even with these tests, your caregiver may not be able to tell what kind of arthritis you have. Consultation with a specialist (rheumatologist) may be helpful. TREATMENT  Your caregiver will discuss with you treatment specific to your type of arthritis. If the specific type cannot be determined, then the following general recommendations may apply. Treatment of severe joint pain includes:  Rest.  Elevation.  Anti-inflammatory medication (for example, ibuprofen) may be prescribed. Avoiding activities that cause increased pain.  Only take over-the-counter or prescription medicines for pain and discomfort as recommended by your caregiver.  Cold packs over an inflamed joint may be used for 10 to 15 minutes every hour. Hot packs sometimes feel better, but do not use overnight. Do not use hot packs if you are diabetic without your caregiver's permission.  A cortisone shot into arthritic joints may help reduce pain and swelling.  Any  acute arthritis that gets worse over the next 1 to 2 days needs to be looked at to be sure there is no joint infection. Long-term arthritis treatment involves modifying activities and lifestyle to reduce joint stress jarring. This can include weight loss. Also, exercise is needed to nourish the  joint cartilage and remove waste. This helps keep the muscles around the joint strong. HOME CARE INSTRUCTIONS   Do not take aspirin to relieve pain if gout is suspected. This elevates uric acid levels.  Only take over-the-counter or prescription medicines for pain, discomfort or fever as directed by your caregiver.  Rest the joint as much as possible.  If your joint is swollen, keep it elevated.  Use crutches if the painful joint is in your leg.  Drinking plenty of fluids may help for certain types of arthritis.  Follow your caregiver's dietary instructions.  Try low-impact exercise such as:  Swimming.  Water aerobics.  Biking.  Walking.  Morning stiffness is often relieved by a warm shower.  Put your joints through regular range-of-motion. SEEK MEDICAL CARE IF:   You do not feel better in 24 hours or are getting worse.  You have side effects to medications, or are not getting better with treatment. SEEK IMMEDIATE MEDICAL CARE IF:   You have a fever.  You develop severe joint pain, swelling or redness.  Many joints are involved and become painful and swollen.  There is severe back pain and/or leg weakness.  You have loss of bowel or bladder control. Document Released: 03/28/2004 Document Revised: 05/13/2011 Document Reviewed: 04/13/2008 [facility] Patient Information 2015 [place], [place]. This information is not intended to replace advice given to you by your health care provider. Make sure you discuss any questions you have with your health care provider.

## 2014-02-01 NOTE — ED Notes (Addendum)
Pt reports R big toe pain since Wednesday.  Takes allopurinol for hx of gout.  Pt also reports he takes meds for  HTN-did not take his dose today

## 2014-02-01 NOTE — ED Provider Notes (Signed)
CSN: [id number]     Arrival date & time 02/01/14  1849 History  This chart was scribed for Antony MaduraKelly Quame Spratlin, PA-C with Linwood DibblesJon Knapp, MD by Tonye RoyaltyJoshua Chen, ED Scribe. This patient was seen in room WTR8/WTR8 and the patient's care was started at 8:39 PM.    Chief Complaint  Patient presents with  . Toe Pain   The history is provided by the patient. No language interpreter was used.    HPI Comments: Parker West is a 43 y.o. male who presents to the Emergency Department complaining of pain to his right big toe with onset 6 days ago. He states his pain is waxing and waning but has been constant and has not worsened significantly. He describes pain as sharp and aching. He reports associated pain to his back since 6 days ago. He states he has used Gout Out, Advil, and a 'natural pain medication' without improvement. He denies injury to his toe. He denies history of gout but states he has been compliant with Allopurinol prescription which he started 4-5 months ago; though he has not had gout before, he was put on Allopurinol because a test at the [facility] revealed evidence of high uric acid level. He denies history of joint pain and does not know why they checked his uric acid level. He denies recent alcohol use or high consumption of red meat. He reports prior DVT and rash on his right leg for which he was hospitalized 2 years ago. He reports history of HTN but denies diabetes. He notes abnormal kidney function tests at the [facility]. He denies loss of sensation in his foot, erythema, or significant heat  Past Medical History  Diagnosis Date  . Hypertension   . Hyperlipidemia   . Obesity   . Heart murmur     "when I was small"  . Anginal pain   . Pneumonia 1988    "walking"  . Staph infection 09/04/11    BLE/wife's report  . Hematochezia 10/09/2011    Low grade antedated DVT  . Microcytic normochromic anemia 10/09/2011    retic 1.6% Hb 9.9 10/09/11  . Dyspnea 10/09/2011    At rest  Recent DVT    Past Surgical History   Procedure Laterality Date  . Reduction mammaplasty  1990's  . Eye surgery      stye excision   Family History  Problem Relation Age of Onset  . Stroke Mother   . Hypertension Mother   . Diabetes Mother   . Hypertension Father   . Ulcers Father     peptic  . Breast cancer Mother   . Colon cancer Maternal Aunt    History  Substance Use Topics  . Smoking status: Never Smoker   . Smokeless tobacco: Never Used  . Alcohol Use: Yes     Comment: 09/05/11 "Social drinker; nothing weekly"    Review of Systems  Constitutional: Negative for fever.  Musculoskeletal: Positive for arthralgias.       Pain to right great toe  Skin: Negative for color change.  Neurological: Negative for numbness.  All other systems reviewed and are negative.   Allergies  Review of patient's allergies indicates no known allergies.  Home Medications   Prior to Admission medications   Medication Sig Start Date End Date Taking? Authorizing Provider  allopurinol (ZYLOPRIM) 100 MG tablet Take 200 mg by mouth daily.   Yes Historical Provider, MD  losartan (COZAAR) 100 MG tablet 1 TAB BY MOUTH DAILY--OFFICE VISIT DUE NOW 04/10/13  Yes Grayling CongressYvonne R Lowne, DO  amLODipine (NORVASC) 10 MG tablet Take 1 tablet (10 mg total) by mouth daily. Patient not taking: Reported on 02/01/2014 10/21/11 10/20/12  Lelon PerlaYvonne R Lowne, DO  furosemide (LASIX) 40 MG tablet Take 1 tablet (40 mg total) by mouth daily. Patient not taking: Reported on 02/01/2014 01/09/12 01/08/13  Lelon PerlaYvonne R Lowne, DO  HYDROcodone-acetaminophen (NORCO/VICODIN) 5-325 MG per tablet Take 1 tablet by mouth every 4 (four) hours as needed for moderate pain or severe pain. 02/01/14   Antony MaduraKelly Ronasia Isola, PA-C  iron polysaccharides (NIFEREX) 150 MG capsule Take 1 capsule (150 mg total) by mouth 2 (two) times daily. Patient not taking: Reported on 02/01/2014 10/09/11 10/08/12  Levert FeinsteinJames M Granfortuna, MD  metoprolol succinate (TOPROL-XL) 25 MG 24 hr tablet TAKE 1 TABLET (25 MG TOTAL) BY MOUTH  DAILY. Patient not taking: Reported on 02/01/2014 12/17/11   Lelon PerlaYvonne R Lowne, DO  metoprolol succinate (TOPROL-XL) 25 MG 24 hr tablet TAKE 1 TABLET (25 MG TOTAL) BY MOUTH DAILY. Patient not taking: Reported on 02/01/2014 01/18/12   Lelon PerlaYvonne R Lowne, DO  predniSONE (DELTASONE) 50 MG tablet Take 1 tablet daily 02/01/14   Antony MaduraKelly Jylian Pappalardo, PA-C  warfarin (COUMADIN) 7.5 MG tablet 1-2 TABLETS BY MOUTH AS DIRECTED BY MD 10/09/11 CURRENTLY TAKING 2 TABLETS=15MG  DAILY Patient not taking: Reported on 02/01/2014 11/23/11   Lelon PerlaYvonne R Lowne, DO  warfarin (COUMADIN) 7.5 MG tablet 1-2 tablets by mouth as directed by MD 10/09/11 currently taking 2 tablets=15mg  daily Patient not taking: Reported on 02/01/2014 01/09/12   Myrene BuddyYvonne R Lowne, DO   BP 148/100 mmHg  Pulse 87  Temp(Src) 98.3 F (36.8 C) (Oral)  Resp 16  SpO2 98%   Physical Exam  Constitutional: He is oriented to person, place, and time. He appears well-developed and well-nourished. No distress.  Nontoxic/nonseptic appearing  HENT:  Head: Normocephalic and atraumatic.  Eyes: Conjunctivae and EOM are normal. No scleral icterus.  Neck: Normal range of motion.  Cardiovascular: Normal rate, regular rhythm and intact distal pulses.   DP and PT pulses 2+ in right lower extremity  Pulmonary/Chest: Effort normal. No respiratory distress.  Musculoskeletal: Normal range of motion. He exhibits tenderness.       Right foot: There is tenderness and bony tenderness. There is normal range of motion, no swelling, normal capillary refill, no crepitus and no laceration.       Feet:  Tenderness to palpation at right first MTP joint. No significant swelling, effusion, erythema, or heat to touch. No crepitus or deformity.  Neurological: He is alert and oriented to person, place, and time. He exhibits normal muscle tone. Coordination normal.  Sensation to light touch intact. Patient able to wiggle all toes of right foot. Patient ambulatory with antalgic gait.  Skin: Skin is warm and  dry. No rash noted. He is not diaphoretic. No erythema. No pallor.  Psychiatric: He has a normal mood and affect. His behavior is normal.  Nursing note and vitals reviewed.   ED Course  Procedures (including critical care time)  DIAGNOSTIC STUDIES: Oxygen Saturation is 100% on room air, normal by my interpretation.    COORDINATION OF CARE: 8:51 PM Discussed treatment plan with patient at beside, including x-ray and medication for pain. The patient agrees with the plan and has no further questions at this time.   Labs Review Labs Reviewed  I-STAT CREATININE, ED - Abnormal; Notable for the following:    Creatinine, Ser 1.70 (*)    All other components within normal limits  Imaging Review Dg Toe Great Right  02/01/2014   CLINICAL DATA:  Acute right great toe pain.  EXAM: RIGHT GREAT TOE  COMPARISON:  None.  FINDINGS: Minor degenerative change of the right first MTP joint. Normal alignment. No acute fracture. Mild soft tissue swelling. No radiopaque foreign body.  IMPRESSION: Soft tissue swelling and minor degenerative change of the right first MTP joint. No acute osseous finding.   Electronically Signed   By: Ruel Favors M.D.   On: 02/01/2014 21:20     EKG Interpretation None      MDM   Final diagnoses:  Toe pain, right  Arthritis of first MTP joint  Elevated serum creatinine    43 year old male presents to the emergency department for further evaluation of right toe pain x1 week. He states he takes allopurinol because he was found to have an "elevated uric acid level", but denies ever having a gout flare or symptoms consistent with gout. He is neurovascularly intact today. No evidence of septic joint. Imaging today shows minor degenerative changes of the right first MTP joint. Suspect that symptoms may be secondary to osteoarthritis, though acute gout flare/gouty arthritis is also a possibility. Patient unable to be started on indomethacin secondary to elevated creatinine.  Will place patient on prednisone and manage pain with outpatient Norco. Patient given postop shoe for comfort. He has been referred back to his primary care provider for further evaluation of his symptoms as well as a recheck of his kidney function. Return precautions discussed and provided. Patient agreeable to plan with known address concerns.  I personally performed the services described in this documentation, which was scribed in my presence. The recorded information has been reviewed and is accurate.   Filed Vitals:   02/01/14 1937 02/01/14 2244  BP: 163/105 148/100  Pulse: 102 87  Temp: 98.3 F (36.8 C)   TempSrc: Oral   Resp: 20 16  SpO2: 100% 98%     Antony Madura, PA-C 02/02/14 0028  Linwood Dibbles, MD 02/03/14 [phone]

## 2014-02-01 NOTE — ED Notes (Signed)
Ortho tech placing post op shoe at this time

## 2014-02-01 NOTE — ED Notes (Signed)
Pt in x-ray at this time

## 2014-09-01 ENCOUNTER — Ambulatory Visit (INDEPENDENT_AMBULATORY_CARE_PROVIDER_SITE_OTHER): Payer: Self-pay | Admitting: Physician Assistant

## 2014-09-01 VITALS — BP 132/80 | HR 108 | Temp 98.8°F | Resp 17 | Ht 72.0 in | Wt 361.6 lb

## 2014-09-01 DIAGNOSIS — Z0289 Encounter for other administrative examinations: Secondary | ICD-10-CM

## 2014-09-01 NOTE — Progress Notes (Signed)
Subjective:    Patient ID: Parker West, male    DOB: [birth year], 44 y.o.   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 States that he has been well in the interim and has the dx of HTN, but is no longer taking metoprolol as he has lost 20 lbs. Denies h/o DM, insulin use, seizures, mobility d/o, syncope,  OSA, and anxiety.     Review of Systems  Constitutional: Negative.  Negative for fever, chills, activity change, appetite change, fatigue and unexpected weight change.  HENT: Negative.  Negative for tinnitus.   Eyes: Negative.  Negative for photophobia and visual disturbance.  Respiratory: Negative.  Negative for cough, shortness of breath and wheezing.   Cardiovascular: Negative.  Negative for chest pain, palpitations and leg swelling.  Gastrointestinal: Negative.  Negative for nausea, vomiting, diarrhea, constipation and blood in stool.  Genitourinary: Negative.  Negative for dysuria, hematuria and decreased urine volume.  Musculoskeletal: Negative for myalgias, back pain, joint swelling, arthralgias and gait problem.  Neurological: Negative.  Negative for dizziness, light-headedness and headaches.  Psychiatric/Behavioral: Negative.        Objective:   Physical Exam  Constitutional: He is oriented to person, place, and time. He appears well-developed and well-nourished. No distress.  Blood pressure 132/80, pulse 108, temperature 98.8 F (37.1 C), temperature source Oral, resp. rate 17, height 6' (1.829 m), weight 361 lb 9.6 oz (164.021 kg), SpO2 97 %.  HENT:  Head: Normocephalic and atraumatic.  Right Ear: Hearing, tympanic membrane, external ear and ear canal normal.  Left Ear: Hearing, tympanic membrane, external ear and ear canal normal.  Nose: Nose normal.  Mouth/Throat: Uvula is midline, oropharynx is clear and moist and mucous membranes are normal.  Eyes: Conjunctivae, EOM and lids are normal.  Pupils are equal, round, and reactive to light. Right eye exhibits no discharge. Left eye exhibits no discharge. No scleral icterus.  Neck: Trachea normal and normal range of motion. Neck supple. Carotid bruit is not present.  Cardiovascular: Normal rate, regular rhythm, normal heart sounds, intact distal pulses and normal pulses.  Exam reveals no gallop and no friction rub.   No murmur heard. Pulmonary/Chest: Effort normal and breath sounds normal. No respiratory distress. He has no wheezes. He has no rhonchi. He has no rales.  Abdominal: Soft. Normal appearance and bowel sounds are normal. He exhibits no abdominal bruit. There is no tenderness.  Musculoskeletal: Normal range of motion. He exhibits no edema or tenderness.  Lymphadenopathy:       Head (right side): No submental, no submandibular, no tonsillar, no preauricular, no posterior auricular and no occipital adenopathy present.       Head (left side): No submental, no submandibular, no tonsillar, no preauricular, no posterior auricular and no occipital adenopathy present.    He has no cervical adenopathy.  Neurological: He is alert and oriented to person, place, and time. He has normal strength and normal reflexes. No cranial nerve deficit or sensory deficit. Coordination and gait normal.  Skin: Skin is warm, dry and intact. No lesion and no rash noted. He is not diaphoretic. No erythema.  Psychiatric: He has a normal mood and affect. His speech is normal and behavior is normal. Judgment and thought content normal.        Assessment & Plan:  1. Physical examination of employee One year card given. Advised to f/u with PCP for proteinuria evaluation.  Janan Ridgeishira Earvin Blazier PA-C  [facility] 09/01/2014 5:14 PM

## 2014-09-01 NOTE — Patient Instructions (Signed)
Should follow up with primary care provider for proteinuria evaluation.

## 2014-10-29 ENCOUNTER — Encounter (HOSPITAL_COMMUNITY): Payer: Self-pay | Admitting: Emergency Medicine

## 2014-10-29 ENCOUNTER — Inpatient Hospital Stay (HOSPITAL_COMMUNITY)
Admission: EM | Admit: 2014-10-29 | Discharge: 2014-10-31 | DRG: 638 | Disposition: A | Discharge status: Home / Self Care | Payer: 59 | Attending: Internal Medicine | Admitting: Internal Medicine

## 2014-10-29 DIAGNOSIS — I129 Hypertensive chronic kidney disease with stage 1 through stage 4 chronic kidney disease, or unspecified chronic kidney disease: Secondary | ICD-10-CM | POA: Diagnosis present

## 2014-10-29 DIAGNOSIS — E1100 Type 2 diabetes mellitus with hyperosmolarity without nonketotic hyperglycemic-hyperosmolar coma (NKHHC): Principal | ICD-10-CM | POA: Diagnosis present

## 2014-10-29 DIAGNOSIS — E860 Dehydration: Secondary | ICD-10-CM | POA: Diagnosis present

## 2014-10-29 DIAGNOSIS — D72829 Elevated white blood cell count, unspecified: Secondary | ICD-10-CM | POA: Diagnosis present

## 2014-10-29 DIAGNOSIS — T502X5A Adverse effect of carbonic-anhydrase inhibitors, benzothiadiazides and other diuretics, initial encounter: Secondary | ICD-10-CM | POA: Diagnosis present

## 2014-10-29 DIAGNOSIS — I10 Essential (primary) hypertension: Secondary | ICD-10-CM | POA: Diagnosis present

## 2014-10-29 DIAGNOSIS — Z79899 Other long term (current) drug therapy: Secondary | ICD-10-CM

## 2014-10-29 DIAGNOSIS — E871 Hypo-osmolality and hyponatremia: Secondary | ICD-10-CM

## 2014-10-29 DIAGNOSIS — R739 Hyperglycemia, unspecified: Secondary | ICD-10-CM | POA: Diagnosis present

## 2014-10-29 DIAGNOSIS — Z6841 Body Mass Index (BMI) 40.0 and over, adult: Secondary | ICD-10-CM

## 2014-10-29 DIAGNOSIS — Z7952 Long term (current) use of systemic steroids: Secondary | ICD-10-CM

## 2014-10-29 DIAGNOSIS — E119 Type 2 diabetes mellitus without complications: Secondary | ICD-10-CM

## 2014-10-29 DIAGNOSIS — N179 Acute kidney failure, unspecified: Secondary | ICD-10-CM | POA: Diagnosis present

## 2014-10-29 DIAGNOSIS — D509 Iron deficiency anemia, unspecified: Secondary | ICD-10-CM | POA: Diagnosis present

## 2014-10-29 DIAGNOSIS — E785 Hyperlipidemia, unspecified: Secondary | ICD-10-CM | POA: Diagnosis present

## 2014-10-29 DIAGNOSIS — N183 Chronic kidney disease, stage 3 (moderate): Secondary | ICD-10-CM | POA: Diagnosis present

## 2014-10-29 DIAGNOSIS — Z86718 Personal history of other venous thrombosis and embolism: Secondary | ICD-10-CM

## 2014-10-29 DIAGNOSIS — N289 Disorder of kidney and ureter, unspecified: Secondary | ICD-10-CM

## 2014-10-29 LAB — CBG MONITORING, ED: Glucose-Capillary: >600 mg/dL (ref 65–99)

## 2014-10-29 MED ORDER — SODIUM CHLORIDE 0.9 % IV BOLUS (SEPSIS)
2000.0000 mL | Freq: Once | INTRAVENOUS | Status: AC
  Administered 2014-10-30: 2000 mL via INTRAVENOUS

## 2014-10-29 NOTE — ED Provider Notes (Signed)
CSN: [id number]     Arrival date & time 10/29/14  2317 History  This chart was scribed for non-physician practitioner, Earley Favor, NP working with Hollice Espy, MD by Angelene Giovanni, ED Scribe. The patient was seen in room 1443/1443-01 and the patient's care was started at 11:58 PM    Chief Complaint  Patient presents with  . Weakness   The history is provided by the patient. No language interpreter was used.   HPI Comments: Parker West is a 44 y.o. male with a hx of Pre-DM who presents to the Emergency Department complaining of weakness and high blood sugar onset 4 days ago. He reports increase in thirst and urination and that he has had a hard time swallowing, not being able to eat. He also reports N/V onset this afternoon. He states that he has been drinking a lot of water and lemonade. He reports that he has an appointment with his PCP in 2 days who treats him for his gout. He adds that he has not seen his PCP in three months and normally sees her every three months. He states that 3 weeks ago, his sugar was about 142.    Past Medical History  Diagnosis Date  . Hypertension   . Hyperlipidemia   . Obesity   . Heart murmur     "when I was small"  . Anginal pain   . Pneumonia 1988    "walking"  . Staph infection 09/04/11    BLE/wife's report  . Hematochezia 10/09/2011    Low grade antedated DVT  . Microcytic normochromic anemia 10/09/2011    retic 1.6% Hb 9.9 10/09/11  . Dyspnea 10/09/2011    At rest  Recent DVT    Past Surgical History  Procedure Laterality Date  . Reduction mammaplasty  1990's  . Eye surgery      stye excision   Family History  Problem Relation Age of Onset  . Stroke Mother   . Hypertension Mother   . Diabetes Mother   . Hypertension Father   . Ulcers Father     peptic  . Breast cancer Mother   . Colon cancer Maternal Aunt    Social History  Substance Use Topics  . Smoking status: Never Smoker   . Smokeless tobacco: Never Used  . Alcohol Use:  Yes     Comment: 09/05/11 "Social drinker; nothing weekly"    Review of Systems  Constitutional: Negative for fever and chills.  HENT: Positive for sore throat and trouble swallowing.   Respiratory: Negative for shortness of breath.   Cardiovascular: Negative for chest pain.  Gastrointestinal: Positive for nausea and vomiting.  Endocrine: Positive for polydipsia and polyuria.  Genitourinary: Negative for dysuria, hematuria and decreased urine volume.  Musculoskeletal: Positive for myalgias.  Skin: Negative for pallor and rash.  Neurological: Positive for dizziness and weakness. Negative for headaches.  All other systems reviewed and are negative.     Allergies  Review of patient's allergies indicates no known allergies.  Home Medications   Prior to Admission medications   Medication Sig Start Date End Date Taking? Authorizing Provider  acyclovir (ZOVIRAX) 800 MG tablet Take 400 mg by mouth daily.   Yes Historical Provider, MD  allopurinol (ZYLOPRIM) 300 MG tablet Take 300 mg by mouth daily.   Yes Historical Provider, MD  buPROPion (WELLBUTRIN SR) 150 MG 12 hr tablet Take 150 mg by mouth 2 (two) times daily.   Yes Historical Provider, MD  clonazePAM (KLONOPIN) 0.5  MG tablet Take 0.5-1 mg by mouth 2 (two) times daily. Takes 0.5mg  in the morning and  at bedtime   Yes Historical Provider, MD  ferrous sulfate 325 (65 FE) MG tablet Take 325 mg by mouth daily with breakfast.   Yes Historical Provider, MD  hydrochlorothiazide (HYDRODIURIL) 25 MG tablet Take 25 mg by mouth daily.   Yes Historical Provider, MD  losartan (COZAAR) 100 MG tablet Take 100 mg by mouth daily.   Yes Historical Provider, MD  omeprazole (PRILOSEC) 20 MG capsule Take 20 mg by mouth daily before lunch.   Yes Historical Provider, MD  predniSONE (DELTASONE) 20 MG tablet Take 20 mg by mouth 2 (two) times daily as needed (uses when qout flares up).   Yes Historical Provider, MD  simvastatin (ZOCOR) 80 MG tablet Take 40  mg by mouth daily.   Yes Historical Provider, MD  traZODone (DESYREL) 150 MG tablet Take 300 mg by mouth at bedtime.    Yes Historical Provider, MD  Vitamin D, Ergocalciferol, (DRISDOL) 50000 UNITS CAPS capsule Take 50,000 Units by mouth every Sunday.   Yes Historical Provider, MD  metoprolol succinate (TOPROL-XL) 25 MG 24 hr tablet TAKE 1 TABLET (25 MG TOTAL) BY MOUTH DAILY. Patient not taking: Reported on 02/01/2014 12/17/11   Lelon Perla, DO  metoprolol succinate (TOPROL-XL) 25 MG 24 hr tablet TAKE 1 TABLET (25 MG TOTAL) BY MOUTH DAILY. Patient not taking: Reported on 02/01/2014 01/18/12   Grayling Congress Lowne, DO   BP 127/88 mmHg  Pulse 111  Temp(Src) 98 F (36.7 C) (Oral)  Resp 20  Ht 6' (1.829 m)  Wt 343 lb 11.2 oz (155.901 kg)  BMI 46.60 kg/m2  SpO2 100% Physical Exam  Constitutional: He is oriented to person, place, and time. He appears well-developed and well-nourished. No distress.  HENT:  Head: Normocephalic and atraumatic.  Mouth/Throat: Oropharynx is clear and moist.  Eyes: Pupils are equal, round, and reactive to light.  Neck: Normal range of motion. Neck supple.  Cardiovascular: Regular rhythm.  Tachycardia present.   Pulmonary/Chest: Effort normal. No respiratory distress.  Abdominal: Soft. Bowel sounds are normal. He exhibits no distension.  Musculoskeletal: Normal range of motion. He exhibits no edema.  Neurological: He is alert and oriented to person, place, and time.  Skin: Skin is warm and dry.  Psychiatric: He has a normal mood and affect. His behavior is normal.  Nursing note and vitals reviewed.   ED Course  Procedures (including critical care time) DIAGNOSTIC STUDIES: Oxygen Saturation is 97% on RA, normal by my interpretation.    COORDINATION OF CARE: 12:04 AM- Pt advised of plan for treatment and pt agrees.    Labs Review Labs Reviewed  BASIC METABOLIC PANEL - Abnormal; Notable for the following:    Sodium 125 (*)    Chloride 92 (*)    CO2 19 (*)     Glucose, Bld 994 (*)    BUN 32 (*)    Creatinine, Ser 2.74 (*)    GFR calc non Af Amer 27 (*)    GFR calc Af Amer 31 (*)    All other components within normal limits  CBC - Abnormal; Notable for the following:    WBC 16.3 (*)    MCV 72.2 (*)    MCH 23.1 (*)    RDW 17.4 (*)    All other components within normal limits  URINALYSIS, ROUTINE W REFLEX MICROSCOPIC (NOT AT [facility]) - Abnormal; Notable for the following:  Glucose, UA >1000 (*)    Hgb urine dipstick TRACE (*)    Ketones, ur 15 (*)    All other components within normal limits  BLOOD GAS, VENOUS - Abnormal; Notable for the following:    pH, Ven 7.354 (*)    pCO2, Ven 34.6 (*)    pO2, Ven 59.0 (*)    Bicarbonate 18.9 (*)    Acid-base deficit 5.5 (*)    All other components within normal limits  BASIC METABOLIC PANEL - Abnormal; Notable for the following:    Sodium 130 (*)    Chloride 100 (*)    CO2 21 (*)    Glucose, Bld 758 (*)    BUN 29 (*)    Creatinine, Ser 2.45 (*)    Calcium 8.3 (*)    GFR calc non Af Amer 30 (*)    GFR calc Af Amer 35 (*)    All other components within normal limits  BASIC METABOLIC PANEL - Abnormal; Notable for the following:    Glucose, Bld 472 (*)    BUN 28 (*)    Creatinine, Ser 2.36 (*)    GFR calc non Af Amer 32 (*)    GFR calc Af Amer 37 (*)    All other components within normal limits  BASIC METABOLIC PANEL - Abnormal; Notable for the following:    Glucose, Bld 247 (*)    BUN 24 (*)    Creatinine, Ser 2.10 (*)    GFR calc non Af Amer 37 (*)    GFR calc Af Amer 42 (*)    All other components within normal limits  GLUCOSE, CAPILLARY - Abnormal; Notable for the following:    Glucose-Capillary >600 (*)    All other components within normal limits  GLUCOSE, CAPILLARY - Abnormal; Notable for the following:    Glucose-Capillary 562 (*)    All other components within normal limits  GLUCOSE, CAPILLARY - Abnormal; Notable for the following:    Glucose-Capillary 530 (*)    All other  components within normal limits  GLUCOSE, CAPILLARY - Abnormal; Notable for the following:    Glucose-Capillary 413 (*)    All other components within normal limits  GLUCOSE, CAPILLARY - Abnormal; Notable for the following:    Glucose-Capillary 329 (*)    All other components within normal limits  GLUCOSE, CAPILLARY - Abnormal; Notable for the following:    Glucose-Capillary 273 (*)    All other components within normal limits  GLUCOSE, CAPILLARY - Abnormal; Notable for the following:    Glucose-Capillary 250 (*)    All other components within normal limits  GLUCOSE, CAPILLARY - Abnormal; Notable for the following:    Glucose-Capillary 227 (*)    All other components within normal limits  GLUCOSE, CAPILLARY - Abnormal; Notable for the following:    Glucose-Capillary 194 (*)    All other components within normal limits  GLUCOSE, CAPILLARY - Abnormal; Notable for the following:    Glucose-Capillary 240 (*)    All other components within normal limits  CBG MONITORING, ED - Abnormal; Notable for the following:    Glucose-Capillary >600 (*)    All other components within normal limits  I-STAT CHEM 8, ED - Abnormal; Notable for the following:    Sodium 128 (*)    Chloride 96 (*)    BUN 38 (*)    Creatinine, Ser 2.40 (*)    Glucose, Bld >700 (*)    All other components within normal limits  URINE MICROSCOPIC-ADD  ON  BASIC METABOLIC PANEL  CBC  CBG MONITORING, ED    Imaging Review No results found. I have personally reviewed and evaluated these images and lab results as part of my medical decision-making.   EKG Interpretation   Date/Time:  Sunday October 30 2014 00:10:25 EDT Ventricular Rate:  110 PR Interval:  143 QRS Duration: 96 QT Interval:  346 QTC Calculation: 468 R Axis:   -31 Text Interpretation:  Sinus tachycardia Left axis deviation Borderline T  abnormalities, anterior leads Confirmed by OTTER  MD, OLGA ([id number]) on  10/30/2014 12:14:08 AM      MDM   Final  diagnoses:  Hyperglycemia  New onset type 2 diabetes mellitus  Hyponatremia  Renal insufficiency    I personally performed the services described in this documentation, which was scribed in my presence. The recorded information has been reviewed and is accurate.   Earley Favor, NP 10/30/14 1954  Marisa Severin, MD 11/09/14 2035

## 2014-10-29 NOTE — ED Notes (Signed)
Pt arrived to the ED with a complaint of weakness.  Pt states he has gone to the [place] and been told he is prediabetic.  Pt states he is lethargic and weak.  Pt has been sleeping for several days.  Pt is constantly thirsty.  Pt states he feels like his throat is closing up.  CBG in triage is over 600.

## 2014-10-30 DIAGNOSIS — Z6841 Body Mass Index (BMI) 40.0 and over, adult: Secondary | ICD-10-CM | POA: Diagnosis not present

## 2014-10-30 DIAGNOSIS — E871 Hypo-osmolality and hyponatremia: Secondary | ICD-10-CM | POA: Diagnosis present

## 2014-10-30 DIAGNOSIS — N179 Acute kidney failure, unspecified: Secondary | ICD-10-CM

## 2014-10-30 DIAGNOSIS — Z79899 Other long term (current) drug therapy: Secondary | ICD-10-CM | POA: Diagnosis not present

## 2014-10-30 DIAGNOSIS — E860 Dehydration: Secondary | ICD-10-CM | POA: Diagnosis present

## 2014-10-30 DIAGNOSIS — R739 Hyperglycemia, unspecified: Secondary | ICD-10-CM

## 2014-10-30 DIAGNOSIS — E785 Hyperlipidemia, unspecified: Secondary | ICD-10-CM | POA: Diagnosis present

## 2014-10-30 DIAGNOSIS — Z7952 Long term (current) use of systemic steroids: Secondary | ICD-10-CM | POA: Diagnosis not present

## 2014-10-30 DIAGNOSIS — E119 Type 2 diabetes mellitus without complications: Secondary | ICD-10-CM

## 2014-10-30 DIAGNOSIS — I129 Hypertensive chronic kidney disease with stage 1 through stage 4 chronic kidney disease, or unspecified chronic kidney disease: Secondary | ICD-10-CM | POA: Diagnosis present

## 2014-10-30 DIAGNOSIS — D72829 Elevated white blood cell count, unspecified: Secondary | ICD-10-CM | POA: Diagnosis present

## 2014-10-30 DIAGNOSIS — E1100 Type 2 diabetes mellitus with hyperosmolarity without nonketotic hyperglycemic-hyperosmolar coma (NKHHC): Principal | ICD-10-CM

## 2014-10-30 DIAGNOSIS — T502X5A Adverse effect of carbonic-anhydrase inhibitors, benzothiadiazides and other diuretics, initial encounter: Secondary | ICD-10-CM | POA: Diagnosis present

## 2014-10-30 DIAGNOSIS — N183 Chronic kidney disease, stage 3 (moderate): Secondary | ICD-10-CM | POA: Diagnosis present

## 2014-10-30 DIAGNOSIS — Z86718 Personal history of other venous thrombosis and embolism: Secondary | ICD-10-CM | POA: Diagnosis not present

## 2014-10-30 DIAGNOSIS — D509 Iron deficiency anemia, unspecified: Secondary | ICD-10-CM | POA: Diagnosis present

## 2014-10-30 DIAGNOSIS — I10 Essential (primary) hypertension: Secondary | ICD-10-CM

## 2014-10-30 LAB — GLUCOSE, CAPILLARY
GLUCOSE-CAPILLARY: 562 mg/dL — AB (ref 65–99)
GLUCOSE-CAPILLARY: 329 mg/dL — AB (ref 65–99)
GLUCOSE-CAPILLARY: 273 mg/dL — AB (ref 65–99)
GLUCOSE-CAPILLARY: 250 mg/dL — AB (ref 65–99)
GLUCOSE-CAPILLARY: 240 mg/dL — AB (ref 65–99)
Glucose-Capillary: 530 mg/dL — ABNORMAL HIGH (ref 65–99)
Glucose-Capillary: 413 mg/dL — ABNORMAL HIGH (ref 65–99)
Glucose-Capillary: 227 mg/dL — ABNORMAL HIGH (ref 65–99)
Glucose-Capillary: 194 mg/dL — ABNORMAL HIGH (ref 65–99)

## 2014-10-30 LAB — URINALYSIS, ROUTINE W REFLEX MICROSCOPIC
BILIRUBIN URINE: NEGATIVE
Glucose, UA: >1000 mg/dL — AB
KETONES UR: 15 mg/dL — AB
Leukocytes, UA: NEGATIVE
NITRITE: NEGATIVE
PROTEIN: NEGATIVE mg/dL
SPECIFIC GRAVITY, URINE: 1.026 (ref 1.005–1.030)
UROBILINOGEN UA: 0.2 mg/dL (ref 0.0–1.0)
pH: 5.5 (ref 5.0–8.0)

## 2014-10-30 LAB — BASIC METABOLIC PANEL
ANION GAP: 14 (ref 5–15)
ANION GAP: 9 (ref 5–15)
Anion gap: 8 (ref 5–15)
Anion gap: 9 (ref 5–15)
BUN: 24 mg/dL — AB (ref 6–20)
BUN: 28 mg/dL — AB (ref 6–20)
BUN: 29 mg/dL — AB (ref 6–20)
BUN: 32 mg/dL — AB (ref 6–20)
CALCIUM: 8.3 mg/dL — AB (ref 8.9–10.3)
CALCIUM: 9 mg/dL (ref 8.9–10.3)
CALCIUM: 9 mg/dL (ref 8.9–10.3)
CALCIUM: 9.1 mg/dL (ref 8.9–10.3)
CO2: 19 mmol/L — ABNORMAL LOW (ref 22–32)
CO2: 21 mmol/L — ABNORMAL LOW (ref 22–32)
CO2: 24 mmol/L (ref 22–32)
CO2: 22 mmol/L (ref 22–32)
CREATININE: 2.45 mg/dL — AB (ref 0.61–1.24)
CREATININE: 2.36 mg/dL — AB (ref 0.61–1.24)
Chloride: 92 mmol/L — ABNORMAL LOW (ref 101–111)
Chloride: 100 mmol/L — ABNORMAL LOW (ref 101–111)
Chloride: 105 mmol/L (ref 101–111)
Chloride: 108 mmol/L (ref 101–111)
Creatinine, Ser: 2.74 mg/dL — ABNORMAL HIGH (ref 0.61–1.24)
Creatinine, Ser: 2.1 mg/dL — ABNORMAL HIGH (ref 0.61–1.24)
GFR calc Af Amer: 31 mL/min — ABNORMAL LOW (ref >60)
GFR calc Af Amer: 35 mL/min — ABNORMAL LOW (ref >60)
GFR calc Af Amer: 37 mL/min — ABNORMAL LOW (ref >60)
GFR calc Af Amer: 42 mL/min — ABNORMAL LOW (ref >60)
GFR, EST NON AFRICAN AMERICAN: 27 mL/min — AB (ref >60)
GFR, EST NON AFRICAN AMERICAN: 30 mL/min — AB (ref >60)
GFR, EST NON AFRICAN AMERICAN: 32 mL/min — AB (ref >60)
GFR, EST NON AFRICAN AMERICAN: 37 mL/min — AB (ref >60)
GLUCOSE: 758 mg/dL — AB (ref 65–99)
GLUCOSE: 472 mg/dL — AB (ref 65–99)
GLUCOSE: 247 mg/dL — AB (ref 65–99)
Glucose, Bld: 994 mg/dL (ref 65–99)
POTASSIUM: 5 mmol/L (ref 3.5–5.1)
Potassium: 4.8 mmol/L (ref 3.5–5.1)
Potassium: 4 mmol/L (ref 3.5–5.1)
Potassium: 3.7 mmol/L (ref 3.5–5.1)
SODIUM: 125 mmol/L — AB (ref 135–145)
Sodium: 130 mmol/L — ABNORMAL LOW (ref 135–145)
Sodium: 137 mmol/L (ref 135–145)
Sodium: 139 mmol/L (ref 135–145)

## 2014-10-30 LAB — I-STAT CHEM 8, ED
BUN: 38 mg/dL — ABNORMAL HIGH (ref 6–20)
CHLORIDE: 96 mmol/L — AB (ref 101–111)
Calcium, Ion: 1.2 mmol/L (ref 1.12–1.23)
Creatinine, Ser: 2.4 mg/dL — ABNORMAL HIGH (ref 0.61–1.24)
HCT: 48 % (ref 39.0–52.0)
Hemoglobin: 16.3 g/dL (ref 13.0–17.0)
POTASSIUM: 5.1 mmol/L (ref 3.5–5.1)
SODIUM: 128 mmol/L — AB (ref 135–145)
TCO2: 22 mmol/L (ref 0–100)

## 2014-10-30 LAB — CBC
HEMATOCRIT: 41.2 % (ref 39.0–52.0)
Hemoglobin: 13.2 g/dL (ref 13.0–17.0)
MCH: 23.1 pg — ABNORMAL LOW (ref 26.0–34.0)
MCHC: 32 g/dL (ref 30.0–36.0)
MCV: 72.2 fL — ABNORMAL LOW (ref 78.0–100.0)
Platelets: 327 10*3/uL (ref 150–400)
RBC: 5.71 MIL/uL (ref 4.22–5.81)
RDW: 17.4 % — AB (ref 11.5–15.5)
WBC: 16.3 10*3/uL — AB (ref 4.0–10.5)

## 2014-10-30 LAB — BLOOD GAS, VENOUS
Acid-base deficit: 5.5 mmol/L — ABNORMAL HIGH (ref 0.0–2.0)
BICARBONATE: 18.9 meq/L — AB (ref 20.0–24.0)
FIO2: 0.21
O2 Saturation: 89.8 %
PATIENT TEMPERATURE: 97.7
PH VEN: 7.354 — AB (ref 7.250–7.300)
TCO2: 16.9 mmol/L (ref 0–100)
pCO2, Ven: 34.6 mmHg — ABNORMAL LOW (ref 45.0–50.0)
pO2, Ven: 59 mmHg — ABNORMAL HIGH (ref 30.0–45.0)

## 2014-10-30 LAB — URINE MICROSCOPIC-ADD ON

## 2014-10-30 MED ORDER — SODIUM CHLORIDE 0.9 % IV SOLN
1000.0000 mL | INTRAVENOUS | Status: DC
  Administered 2014-10-31: 1000 mL via INTRAVENOUS

## 2014-10-30 MED ORDER — DEXTROSE-NACL 5-0.45 % IV SOLN: INTRAVENOUS | Status: DC

## 2014-10-30 MED ORDER — LIVING WELL WITH DIABETES BOOK
Freq: Once | Status: AC
  Administered 2014-10-30: 03:00:00
  Filled 2014-10-30: qty 1

## 2014-10-30 MED ORDER — HYDRALAZINE HCL 20 MG/ML IJ SOLN: 5.0000 mg | Freq: Four times a day (QID) | INTRAMUSCULAR | Status: DC | PRN

## 2014-10-30 MED ORDER — INSULIN GLARGINE 100 UNIT/ML ~~LOC~~ SOLN
15.0000 [IU] | Freq: Every day | SUBCUTANEOUS | Status: DC
  Administered 2014-10-30: 15 [IU] via SUBCUTANEOUS
  Filled 2014-10-30 (×2): qty 0.15

## 2014-10-30 MED ORDER — LIVING WELL WITH DIABETES BOOK
Freq: Once | Status: AC
  Administered 2014-10-30: 02:00:00
  Filled 2014-10-30: qty 1

## 2014-10-30 MED ORDER — SODIUM CHLORIDE 0.9 % IV SOLN
1000.0000 mL | Freq: Once | INTRAVENOUS | Status: AC
  Administered 2014-10-30: 1000 mL via INTRAVENOUS

## 2014-10-30 MED ORDER — ENOXAPARIN SODIUM 80 MG/0.8ML ~~LOC~~ SOLN
80.0000 mg | SUBCUTANEOUS | Status: DC
  Administered 2014-10-30 – 2014-10-31 (×2): 80 mg via SUBCUTANEOUS
  Filled 2014-10-30 (×2): qty 0.8

## 2014-10-30 MED ORDER — SODIUM CHLORIDE 0.9 % IV SOLN
INTRAVENOUS | Status: DC
  Administered 2014-10-30: 5.4 [IU]/h via INTRAVENOUS
  Filled 2014-10-30: qty 2.5

## 2014-10-30 MED ORDER — INSULIN ASPART 100 UNIT/ML ~~LOC~~ SOLN
0.0000 [IU] | Freq: Three times a day (TID) | SUBCUTANEOUS | Status: DC
  Administered 2014-10-30: 7 [IU] via SUBCUTANEOUS
  Administered 2014-10-31: 30 [IU] via SUBCUTANEOUS
  Administered 2014-10-31: 20 [IU] via SUBCUTANEOUS

## 2014-10-30 MED ORDER — INFLUENZA VAC SPLIT QUAD 0.5 ML IM SUSY
0.5000 mL | PREFILLED_SYRINGE | INTRAMUSCULAR | Status: AC
  Administered 2014-10-31: 0.5 mL via INTRAMUSCULAR
  Filled 2014-10-30 (×2): qty 0.5

## 2014-10-30 MED ORDER — PNEUMOCOCCAL VAC POLYVALENT 25 MCG/0.5ML IJ INJ
0.5000 mL | INJECTION | INTRAMUSCULAR | Status: AC
  Administered 2014-10-31: 0.5 mL via INTRAMUSCULAR
  Filled 2014-10-30 (×2): qty 0.5

## 2014-10-30 MED ORDER — DEXTROSE 50 % IV SOLN: 25.0000 mL | INTRAVENOUS | Status: DC | PRN

## 2014-10-30 MED ORDER — INSULIN REGULAR BOLUS VIA INFUSION
0.0000 [IU] | Freq: Three times a day (TID) | INTRAVENOUS | Status: DC
  Filled 2014-10-30: qty 10

## 2014-10-30 MED ORDER — INSULIN REGULAR HUMAN 100 UNIT/ML IJ SOLN
INTRAMUSCULAR | Status: DC
  Administered 2014-10-30: 8.5 [IU]/h via INTRAVENOUS
  Administered 2014-10-30: 10 [IU]/h via INTRAVENOUS
  Administered 2014-10-30: 9.5 [IU]/h via INTRAVENOUS
  Administered 2014-10-30: 8.1 [IU]/h via INTRAVENOUS
  Filled 2014-10-30: qty 2.5

## 2014-10-30 MED ORDER — PANTOPRAZOLE SODIUM 40 MG IV SOLR
40.0000 mg | INTRAVENOUS | Status: DC
  Administered 2014-10-30 (×2): 40 mg via INTRAVENOUS
  Filled 2014-10-30 (×2): qty 40

## 2014-10-30 MED ORDER — INSULIN ASPART 100 UNIT/ML ~~LOC~~ SOLN
0.0000 [IU] | Freq: Every day | SUBCUTANEOUS | Status: DC
  Administered 2014-10-30: 4 [IU] via SUBCUTANEOUS

## 2014-10-30 MED ORDER — SODIUM CHLORIDE 0.9 % IV SOLN
INTRAVENOUS | Status: DC
  Administered 2014-10-30 (×3): via INTRAVENOUS

## 2014-10-30 NOTE — Progress Notes (Signed)
ANTICOAGULATION CONSULT NOTE  Pharmacy Consult for Lovenox Indication: VTE prophylaxis  No Known Allergies  Patient Measurements: Height: 6' (182.9 cm) Weight: (!) 343 lb 11.2 oz (155.901 kg) IBW/kg (Calculated) : 77.6  Medical History: Past Medical History  Diagnosis Date  . Hypertension   . Hyperlipidemia   . Obesity   . Heart murmur     "when I was small"  . Anginal pain   . Pneumonia 1988    "walking"  . Staph infection 09/04/11    BLE/wife's report  . Hematochezia 10/09/2011    Low grade antedated DVT  . Microcytic normochromic anemia 10/09/2011    retic 1.6% Hb 9.9 10/09/11  . Dyspnea 10/09/2011    At rest  Recent DVT     Medications:  Scheduled:  . [START ON 10/31/2014] Influenza vac split quadrivalent PF  0.5 mL Intramuscular Tomorrow-1000  . insulin regular  0-10 Units Intravenous TID WC  . pantoprazole (PROTONIX) IV  40 mg Intravenous Q24H  . [START ON 10/31/2014] pneumococcal 23 valent vaccine  0.5 mL Intramuscular Tomorrow-1000   Infusions:  . sodium chloride    . sodium chloride 150 mL/hr at 10/30/14 0956  . insulin (NOVOLIN-R) infusion 10 Units/hr (10/30/14 1104)   PRN: hydrALAZINE  Assessment: 44 yo male admitted with diabetic hyperosmolar non-ketotic hyperglycemia. Pharmacy is consulted to dose Lovenox for VTE prophylaxis given obesity and renal insufficiency.  Weight: 156kg SCr: 2.36 CrCl 61 ml/min Hgb 16.3 Plts: 327  Goal of Therapy:  Anti-Xa level 0.3-0.5 units/ml 4hrs after LMWH dose given Monitor platelets by anticoagulation protocol: Yes   Plan:   Lovenox 0.5 mg/kg SQ q24h  Consider checking level at steady state if renal function does not improve  Monitor CBC and renal function  Loralee Pacas, PharmD, BCPS Pager: [phone] 10/30/2014,11:17 AM

## 2014-10-30 NOTE — Progress Notes (Signed)
   Follow Up Note  Pt admitted earlier this morning.  Seen after arrived to floor.  Feeling somewhat  Exam: CV: Regular rate and rhythm, S1-S2 Lungs: Clear to auscultation bilaterally, limited by body habitus Abd: Soft, obese, nontender, positive bowel sounds Ext: No clubbing or cyanosis or edema   Present on Admission:  . 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 Has started Lantus. Awaiting A1c. Sliding scale.   . Essential hypertension: Holding antihypertensives while patient volume depleted  . AKI (acute kidney injury): Secondary to diuresis from hyperglycemia. Improving. Recheck labs in the morning  . Leukocytosis: Likely stress margination, no fever. Recheck labs in the morning  . Hyperlipidemia  Morbid obesity: Patient meets criteria with BMI greater than 40

## 2014-10-30 NOTE — Progress Notes (Signed)
BMET results have come in  and have notified Dr. Elisabeth Pigeon on call as per protocol.  Acknowledges glucose of 758 and that we are continuing insulin drip at same rate of 5.4 as per protocol.  Dr. Elisabeth Pigeon agrees with protocol and that I can enter 601 into stabilizer for a total of 3 times in a row.  She orders no changes at this time and continue to follow protocol.

## 2014-10-30 NOTE — Progress Notes (Signed)
Pt brought up to 4th floor at 0230 with insulin drip going at 5.4cc/hr.  Reported that last CBG was at 0130 and would need a cbg now.   CBG on floor meter reading "critical high" so stat lab glucose ordered.  Lab techs had trouble getting blood draw required 2 different tech.  Results obtained at 0340 are 758.  Glucostabizer will not accept anything over 601.  I called meagan Rn in ICU as pharmacy told me to do to use as a resource and she explained to me that we are to follow the hypoglycemic protocol found in epic sidebar and she told me that it said "to put 601 in the glucostabilizer and use that result for insulin rate".  Glucostablilzer result is to keep the insulin rate at 5.4 and recheck cbg in 1 hour.   PT is stable without changes.  Educated on s/s to report to me and pt and wife verbalize understanding.  Will continue to monitor closely.

## 2014-10-30 NOTE — Progress Notes (Signed)
Utilization review completed.  

## 2014-10-30 NOTE — H&P (Signed)
[facility] Admission History and Physical       Parker West:[id number] DOB: 1971/01/11 DOA: 10/29/2014  Referring physician: EDP PCP: Loreen Freud, DO  Specialists:   Chief Complaint: Increased Thirst, Urination, and Weakness  HPI: Parker West is a 44 y.o. male with a history of Pre-Diabetes, HTN, Hyperlipidemia, and Gout who presents to the ED with complaints of increased thirst, and urination along with weakness and fatigue x 4-5 days.   His wife is a diabetic and she checked his blood sugar and it was found as critically high.  In the ED his glucose was found to be over 700.   He was started on the Glucose Stabilizer Protocol and referred for admission.      Review of Systems:  Constitutional: No Weight Loss, No Weight Gain, Night Sweats, Fevers, Chills, Dizziness, +Polydipsia, Light Headedness, +Fatigue,+Generalized Weakness HEENT: No Headaches, Difficulty Swallowing,Tooth/Dental Problems,Sore Throat,  No Sneezing, Rhinitis, Ear Ache, Nasal Congestion, or Post Nasal Drip,  Cardio-vascular:  No Chest pain, Orthopnea, PND, Edema in Lower Extremities, Anasarca, Dizziness, Palpitations  Resp: No Dyspnea, No DOE, No Productive Cough, No Non-Productive Cough, No Hemoptysis, No Wheezing.    GI: No Heartburn, Indigestion, Abdominal Pain, Nausea, Vomiting, Diarrhea, Constipation, Hematemesis, Hematochezia, Melena, Change in Bowel Habits,  Loss of Appetite  GU: No Dysuria, No Change in Color of Urine, +Polyuria, No Urgency or Urinary Frequency, No Flank pain.  Musculoskeletal: No Joint Pain or Swelling, No Decreased Range of Motion, No Back Pain.  Neurologic: No Syncope, No Seizures, Muscle Weakness, Paresthesia, Vision Disturbance or Loss, No Diplopia, No Vertigo, No Difficulty Walking,  Skin: No Rash or Lesions. Psych: No Change in Mood or Affect, No Depression or Anxiety, No Memory loss, No Confusion, or Hallucinations   Past Medical History  Diagnosis Date  .  Hypertension   . Hyperlipidemia   . Obesity   . Heart murmur     "when I was small"  . Anginal pain   . Pneumonia 1988    "walking"  . Staph infection 09/04/11    BLE/wife's report  . Hematochezia 10/09/2011    Low grade antedated DVT  . Microcytic normochromic anemia 10/09/2011    retic 1.6% Hb 9.9 10/09/11  . Dyspnea 10/09/2011    At rest  Recent DVT      Past Surgical History  Procedure Laterality Date  . Reduction mammaplasty  1990's  . Eye surgery      stye excision      Prior to Admission medications   Medication Sig Start Date End Date Taking? Authorizing Provider  hydrochlorothiazide (HYDRODIURIL) 25 MG tablet Take 25 mg by mouth daily.   Yes Historical Provider, MD  traZODone (DESYREL) 150 MG tablet Take 150 mg by mouth at bedtime as needed for sleep.   Yes Historical Provider, MD  allopurinol (ZYLOPRIM) 100 MG tablet Take 200 mg by mouth daily.    Historical Provider, MD  HYDROcodone-acetaminophen (NORCO/VICODIN) 5-325 MG per tablet Take 1 tablet by mouth every 4 (four) hours as needed for moderate pain or severe pain. Patient not taking: Reported on 09/01/2014 02/01/14   Antony Madura, PA-C  metoprolol succinate (TOPROL-XL) 25 MG 24 hr tablet TAKE 1 TABLET (25 MG TOTAL) BY MOUTH DAILY. Patient not taking: Reported on 02/01/2014 12/17/11   Lelon Perla, DO  metoprolol succinate (TOPROL-XL) 25 MG 24 hr tablet TAKE 1 TABLET (25 MG TOTAL) BY MOUTH DAILY. Patient not taking: Reported on 02/01/2014 01/18/12   Lelon Perla,  DO     No Known Allergies  Social History:  reports that he has never smoked. He has never used smokeless tobacco. He reports that he drinks alcohol. He reports that he does not use illicit drugs.    Family History  Problem Relation Age of Onset  . Stroke Mother   . Hypertension Mother   . Diabetes Mother   . Hypertension Father   . Ulcers Father     peptic  . Breast cancer Mother   . Colon cancer Maternal Aunt        Physical Exam:  GEN:   Pleasant Morbidly Obese 44 y.o. African American male examined and in no acute distress; cooperative with exam Filed Vitals:   10/29/14 2337  BP: 143/94  Pulse: 119  Temp: 97.7 F (36.5 C)  TempSrc: Oral  Resp: 18  SpO2: 97%   Blood pressure 143/94, pulse 119, temperature 97.7 F (36.5 C), temperature source Oral, resp. rate 18, SpO2 97 %. PSYCH: He is alert and oriented x4; does not appear anxious does not appear depressed; affect is normal HEENT: Normocephalic and Atraumatic, Mucous membranes pink; PERRLA; EOM intact; Fundi:  Benign;  No scleral icterus, Nares: Patent, Oropharynx: Clear, Fair Dentition,    Neck:  FROM, No Cervical Lymphadenopathy nor Thyromegaly or Carotid Bruit; No JVD; Breasts:: Not examined CHEST WALL: No tenderness CHEST: Normal respiration, clear to auscultation bilaterally HEART: Regular rate and rhythm; no murmurs rubs or gallops BACK: No kyphosis or scoliosis; No CVA tenderness ABDOMEN: Positive Bowel Sounds, Obese, Soft Non-Tender, No Rebound or Guarding; No Masses, No Organomegaly Rectal Exam: Not done EXTREMITIES: No Cyanosis, Clubbing, or Edema; No Ulcerations. Genitalia: not examined PULSES: 2+ and symmetric SKIN: Normal hydration no rash or ulceration CNS:  Alert and Oriented x 4, No Focal Deficits Vascular: pulses palpable throughout    Labs on Admission:  Basic Metabolic Panel:  Recent Labs Lab 10/30/14 0022 10/30/14 0037  NA 125* 128*  K 5.0 5.1  CL 92* 96*  CO2 19*  --   GLUCOSE 994* >700*  BUN 32* 38*  CREATININE 2.74* 2.40*  CALCIUM 9.1  --    Liver Function Tests: No results for input(s): AST, ALT, ALKPHOS, BILITOT, PROT, ALBUMIN in the last 168 hours. No results for input(s): LIPASE, AMYLASE in the last 168 hours. No results for input(s): AMMONIA in the last 168 hours. CBC:  Recent Labs Lab 10/30/14 0022 10/30/14 0037  WBC 16.3*  --   HGB 13.2 16.3  HCT 41.2 48.0  MCV 72.2*  --   PLT 327  --    Cardiac  Enzymes: No results for input(s): CKTOTAL, CKMB, CKMBINDEX, TROPONINI in the last 168 hours.  BNP (last 3 results) No results for input(s): BNP in the last 8760 hours.  ProBNP (last 3 results) No results for input(s): PROBNP in the last 8760 hours.  CBG:  Recent Labs Lab 10/29/14 2351  GLUCAP >600*    Radiological Exams on Admission: No results found.   EKG: Independently reviewed.    Assessment/Plan:   44 y.o. male with  Principal Problem:   1.    Diabetic hyperosmolar non-ketotic state/Hyperglycemia   IV Insulin Drip Protocol   IVFs   Monitor Electrolytes   Active Problems:   2.    AKI (acute kidney injury)   IVFs   Monitor BUN/Cr     3.    Leukocytosis- Stress Rxn   Monitor Trend     4.    Essential hypertension  PRN IV Hydralazine while NPO   On HCTZ, should discontinue due to his History of Gout   Monitor BPs     5.    Hyperlipidemia   Not on current Medication     6.    DVT Prophylaxis   Lovenox          Code Status:     FULL CODE     Family Communication:   Wife at Bedside      Disposition Plan:    Inpatient Status        Time spent:  26 Minutes      Ron Parker [facility] Pager [phone]   If 7AM -7PM Please Contact the Day Rounding Team MD for [facility]  If 7PM-7AM, Please Contact Night-Floor Coverage  [email or web address] Password [email or web address] 10/30/2014, 1:37 AM     ADDENDUM:   Patient was seen and examined on 10/30/2014

## 2014-10-30 NOTE — Progress Notes (Signed)
Texted paged Md with bmet results.

## 2014-10-30 NOTE — ED Notes (Signed)
EKG given to EDP Otter for review 

## 2014-10-31 DIAGNOSIS — D509 Iron deficiency anemia, unspecified: Secondary | ICD-10-CM

## 2014-10-31 LAB — BASIC METABOLIC PANEL
ANION GAP: 10 (ref 5–15)
BUN: 18 mg/dL (ref 6–20)
CALCIUM: 8.3 mg/dL — AB (ref 8.9–10.3)
CO2: 20 mmol/L — ABNORMAL LOW (ref 22–32)
Chloride: 105 mmol/L (ref 101–111)
Creatinine, Ser: 1.96 mg/dL — ABNORMAL HIGH (ref 0.61–1.24)
GFR, EST AFRICAN AMERICAN: 46 mL/min — AB (ref >60)
GFR, EST NON AFRICAN AMERICAN: 40 mL/min — AB (ref >60)
GLUCOSE: 404 mg/dL — AB (ref 65–99)
POTASSIUM: 4 mmol/L (ref 3.5–5.1)
SODIUM: 135 mmol/L (ref 135–145)

## 2014-10-31 LAB — CBC
HEMATOCRIT: 32.8 % — AB (ref 39.0–52.0)
HEMOGLOBIN: 10.7 g/dL — AB (ref 13.0–17.0)
MCH: 23.5 pg — ABNORMAL LOW (ref 26.0–34.0)
MCHC: 32.6 g/dL (ref 30.0–36.0)
MCV: 71.9 fL — ABNORMAL LOW (ref 78.0–100.0)
Platelets: 285 10*3/uL (ref 150–400)
RBC: 4.56 MIL/uL (ref 4.22–5.81)
RDW: 17.2 % — ABNORMAL HIGH (ref 11.5–15.5)
WBC: 11 10*3/uL — AB (ref 4.0–10.5)

## 2014-10-31 LAB — GLUCOSE, CAPILLARY
GLUCOSE-CAPILLARY: 342 mg/dL — AB (ref 65–99)
GLUCOSE-CAPILLARY: 371 mg/dL — AB (ref 65–99)
GLUCOSE-CAPILLARY: 408 mg/dL — AB (ref 65–99)
Glucose-Capillary: 343 mg/dL — ABNORMAL HIGH (ref 65–99)

## 2014-10-31 MED ORDER — INSULIN STARTER KIT- SYRINGES (ENGLISH)
1.0000 | Freq: Once | Status: AC
  Administered 2014-10-31: 1
  Filled 2014-10-31: qty 1

## 2014-10-31 MED ORDER — INSULIN GLARGINE 100 UNIT/ML ~~LOC~~ SOLN
30.0000 [IU] | Freq: Every day | SUBCUTANEOUS | Status: DC
  Administered 2014-10-31: 30 [IU] via SUBCUTANEOUS
  Filled 2014-10-31: qty 0.3

## 2014-10-31 MED ORDER — INSULIN GLARGINE 100 UNIT/ML ~~LOC~~ SOLN: 30.0000 [IU] | Freq: Every day | SUBCUTANEOUS | Status: DC

## 2014-10-31 MED ORDER — INSULIN STARTER KIT- SYRINGES (ENGLISH): 1.0000 | Freq: Once | Status: AC

## 2014-10-31 MED ORDER — INSULIN ASPART 100 UNIT/ML ~~LOC~~ SOLN: 0.0000 [IU] | Freq: Three times a day (TID) | SUBCUTANEOUS | Status: DC

## 2014-10-31 MED ORDER — INSULIN ASPART 100 UNIT/ML ~~LOC~~ SOLN: 0.0000 [IU] | Freq: Every day | SUBCUTANEOUS | Status: DC

## 2014-10-31 MED ORDER — INSULIN LISPRO 100 UNIT/ML CARTRIDGE: 10.0000 [IU] | Freq: Three times a day (TID) | SUBCUTANEOUS | Status: DC

## 2014-10-31 MED ORDER — BLOOD GLUCOSE MONITOR KIT: PACK | Status: DC

## 2014-10-31 MED ORDER — LIVING WELL WITH DIABETES BOOK
Freq: Once | Status: AC
  Administered 2014-10-31: 11:00:00
  Filled 2014-10-31: qty 1

## 2014-10-31 NOTE — Progress Notes (Signed)
Inpatient Diabetes Program Recommendations  AACE/ADA: New Consensus Statement on Inpatient Glycemic Control (2013)  Target Ranges:  Prepandial:   less than 140 mg/dL      Peak postprandial:   less than 180 mg/dL (1-2 hours)      Critically ill patients:  140 - 180 mg/dL   Reason for Visit: Diabetes Consult  Diabetes history: Pre-diabetes Outpatient Diabetes medications: None Current orders for Inpatient glycemic control: Lantus 30 QD, Novolog resistant tidwc and hs HgbA1C pending.  44 year old male with a history of Pre-Diabetes, HTN, Hyperlipidemia, and Gout who presents to the ED with complaints of increased thirst, and urination along with weakness and fatigue x 4-5 days. His wife is a diabetic and she checked his blood sugar and it was found as critically high. In the ED his glucose was found to be over 700. He was started on the Glucose Stabilizer Protocol and referred for admission.Found to be in [place]. GlucoStabilizer initiated and when criteria met for transition, SQ insulin Lantus and Novolog started. Pt has viewed diabetes videos on pt ed channel and was able to perform glucose monitoring and insulin administration. Spoke with pt about new diabetes diagnosis. Discussed basic pathophysiology of DM Type 2, basic home care, importance of checking CBGs and maintaining good CBG control to prevent long-term and short-term complications. Reviewed signs and symptoms of hyperglycemia and hypoglycemia along with treatment for both. RN provided ongoing basic DM education at bedside with this patient and engage patient to actively check blood glucose and administer insulin injections. Have ordered educational booklet, insulin starter kit, and DM videos. Pt will f[facility]u with MD at [facility] within the next week.   Discharge medications: Lantus 30 units QD Novolog resistant tidwc and hs  Consider addition of meal coverage insulin - Novolog 10 units tidwc if pt eats moderate portion meal Will need  prescription for meter, strips and lancets.  Pt states he stays inside his house d/t PTSD. No exercise. This Probation officer encouraged pt to start walking 10 minutes around the house and gradually build up time and frequency. Discussed how exercise lowers blood sugars. Pt seems motivated to make healthy lifestyle changes. Has good support with wife. Would benefit from OP Diabetes Education consult. Will order same. Strongly [facility]. Answered questions related to insulin, diet, exercise, monitoring, and importance of f/u with MD.  Thank you. Lorenda Peck, RD, LDN, CDE Inpatient Diabetes Coordinator [phone]

## 2014-10-31 NOTE — Discharge Summary (Signed)
Discharge Summary  Parker West PVV:[id number] DOB: 1970/09/22  PCP: Rodney Langton, MD  Admit date: 10/29/2014 Discharge date: 10/31/2014  Time spent: 25 minutes  Recommendations for Outpatient Follow-up:  1. New medication: Lantus 30 units subcutaneous daily 2. New medication: NovoLog sliding scale 3. New medication: Humalog 10 units 3 times a day with meals  Discharge Diagnoses:  Active Hospital Problems   Diagnosis Date Noted  . Diabetic hyperosmolar non-ketotic state 10/30/2014  . Hyperglycemia 10/30/2014  . AKI (acute kidney injury) 10/30/2014  . Leukocytosis 10/30/2014  . Hyperlipidemia 10/30/2014  . Morbid obesity   . Microcytic normochromic anemia 10/09/2011  . Essential hypertension 02/17/2009    Resolved Hospital Problems   Diagnosis Date Noted Date Resolved  No resolved problems to display.    Discharge Condition: Improved, being discharged home  Diet recommendation: Carb modified, low sodium  Filed Weights   10/30/14 0230  Weight: 155.901 kg (343 lb 11.2 oz)    History of present illness:  Patient is a 44 year old male with past medical history of morbid obesity and hypertension who for the last week or so has started feeling weaker with blurry vision and frequent urination. His wife has diabetes and when she checked her sugars found him to be markedly elevated so brought into the emergency room. Blood sugars there are greater than 1000. Patient was not in DKA. He was started on IV fluids and IV insulin and hospitalists were consulted for further evaluation and admission  Hospital Course:  Principal Problem:   Diabetic hyperosmolar non-ketotic state/new onset type II diabetic: By afternoon of 8/28, patient able to be weaned off of insulin drip. Started on subcutaneous Lantus. A1c ordered and at time of dictation is still pending. Seen by diabetes educator and initial recommendation for 30 units of Lantus daily +3 times a day short-acting insulin plus  sliding scale. Patient given education. [name] is a diabetic and is very well educated on the process. Patient will follow-up with his PCP at the [facility] on 9/1 Active Problems:   Essential hypertension: Stable. Once he was rehydrated, patient discharged on home medications   Microcytic normochromic anemia: Chronic history. Worked up in the past. MCV at 71. Initially hemoglobin as high as 16.3, second hemoconcentration from dehydration. With IV fluids dropped to 10.7    AKI (acute kidney injury) in the setting of stage III chronic kidney disease: With aggressive hydration, creatinine down to baseline around 1.96   Leukocytosis: Second to stress margination, by 8/29, white count down to 11 from initial 16 without any antibiotics.   Hyperlipidemia   Morbid obesity: Patient meets criteria with BMI greater than 40   Procedures:  None  Consultations:  Diabetes educator  Discharge Exam: BP 154/89 mmHg  Pulse 114  Temp(Src) 98.1 F (36.7 C) (Oral)  Resp 18  Ht 6' (1.829 m)  Wt 155.901 kg (343 lb 11.2 oz)  BMI 46.60 kg/m2  SpO2 100%  General: Alert and oriented 3, no acute distress Cardiovascular: Regular rate and rhythm, S1-S2 Respiratory: Clear to auscultation bilaterally  Discharge Instructions You were cared for by a hospitalist during your hospital stay. If you have any questions about your discharge medications or the care you received while you were in the hospital after you are discharged, you can call the unit and asked to speak with the hospitalist on call if the hospitalist that took care of you is not available. Once you are discharged, your primary care physician will handle any further medical issues. Please  note that NO REFILLS for any discharge medications will be authorized once you are discharged, as it is imperative that you return to your primary care physician (or establish a relationship with a primary care physician if you do not have one) for your  aftercare needs so that they can reassess your need for medications and monitor your lab values.  Discharge Instructions    Ambulatory referral to Nutrition and Diabetic Education    Complete by:  As directed   New diagnosis of DM     Diet - low sodium heart healthy    Complete by:  As directed      Diet Carb Modified    Complete by:  As directed      Increase activity slowly    Complete by:  As directed             Medication List    STOP taking these medications        metoprolol succinate 25 MG 24 hr tablet  Commonly known as:  TOPROL-XL      TAKE these medications        acyclovir 800 MG tablet  Commonly known as:  ZOVIRAX  Take 400 mg by mouth daily.     allopurinol 300 MG tablet  Commonly known as:  ZYLOPRIM  Take 300 mg by mouth daily.     blood glucose meter kit and supplies Kit  Dispense based on patient and insurance preference. Use up to four times daily as directed. (FOR ICD-9 250.00, 250.01).     buPROPion 150 MG 12 hr tablet  Commonly known as:  WELLBUTRIN SR  Take 150 mg by mouth 2 (two) times daily.     clonazePAM 0.5 MG tablet  Commonly known as:  KLONOPIN  Take 0.5-1 mg by mouth 2 (two) times daily. Takes 0.17m in the morning and 125mat bedtime     ferrous sulfate 325 (65 FE) MG tablet  Take 325 mg by mouth daily with breakfast.     hydrochlorothiazide 25 MG tablet  Commonly known as:  HYDRODIURIL  Take 25 mg by mouth daily.     insulin aspart 100 UNIT/ML injection  Commonly known as:  novoLOG  Inject 0-20 Units into the skin 3 (three) times daily with meals.     insulin aspart 100 UNIT/ML injection  Commonly known as:  novoLOG  Inject 0-5 Units into the skin at bedtime.     insulin glargine 100 UNIT/ML injection  Commonly known as:  LANTUS  Inject 0.3 mLs (30 Units total) into the skin daily.     insulin lispro 100 UNIT/ML cartridge  Commonly known as:  HUMALOG  Inject 0.1 mLs (10 Units total) into the skin 3 (three) times daily  with meals.     insulin starter kit- syringes Misc  1 kit by Other route once.     losartan 100 MG tablet  Commonly known as:  COZAAR  Take 100 mg by mouth daily.     omeprazole 20 MG capsule  Commonly known as:  PRILOSEC  Take 20 mg by mouth daily before lunch.     predniSONE 20 MG tablet  Commonly known as:  DELTASONE  Take 20 mg by mouth 2 (two) times daily as needed (uses when qout flares up).     simvastatin 80 MG tablet  Commonly known as:  ZOCOR  Take 40 mg by mouth daily.     traZODone 150 MG tablet  Commonly known as:  DESYREL  Take 300 mg by mouth at bedtime.     Vitamin D (Ergocalciferol) 50000 UNITS Caps capsule  Commonly known as:  DRISDOL  Take 50,000 Units by mouth every Sunday.       No Known Allergies    The results of significant diagnostics from this hospitalization (including imaging, microbiology, ancillary and laboratory) are listed below for reference.    Significant Diagnostic Studies: No results found.  Microbiology: No results found for this or any previous visit (from the past 240 hour(s)).   Labs: Basic Metabolic Panel:  Recent Labs Lab 10/30/14 0022 10/30/14 0037 10/30/14 0310 10/30/14 0640 10/30/14 1103 10/31/14 0455  NA 125* 128* 130* 137 139 135  K 5.0 5.1 4.8 4.0 3.7 4.0  CL 92* 96* 100* 105 108 105  CO2 19*  --  21* 24 22 20*  GLUCOSE 994* >700* 758* 472* 247* 404*  BUN 32* 38* 29* 28* 24* 18  CREATININE 2.74* 2.40* 2.45* 2.36* 2.10* 1.96*  CALCIUM 9.1  --  8.3* 9.0 9.0 8.3*   Liver Function Tests: No results for input(s): AST, ALT, ALKPHOS, BILITOT, PROT, ALBUMIN in the last 168 hours. No results for input(s): LIPASE, AMYLASE in the last 168 hours. No results for input(s): AMMONIA in the last 168 hours. CBC:  Recent Labs Lab 10/30/14 0022 10/30/14 0037 10/31/14 0455  WBC 16.3*  --  11.0*  HGB 13.2 16.3 10.7*  HCT 41.2 48.0 32.8*  MCV 72.2*  --  71.9*  PLT 327  --  285   Cardiac Enzymes: No results for  input(s): CKTOTAL, CKMB, CKMBINDEX, TROPONINI in the last 168 hours. BNP: BNP (last 3 results) No results for input(s): BNP in the last 8760 hours.  ProBNP (last 3 results) No results for input(s): PROBNP in the last 8760 hours.  CBG:  Recent Labs Lab 10/30/14 1727 10/30/14 2217 10/31/14 0721 10/31/14 1125 10/31/14 1423  GLUCAP 240* 342* 371* 408* 343*       Signed:  Deema Juncaj K  [facility] 10/31/2014, 5:43 PM

## 2014-10-31 NOTE — Discharge Instructions (Signed)

## 2014-10-31 NOTE — Progress Notes (Signed)
Reviewed discharge information with patient and caregiver. Answered all questions. Patient/caregiver able to teach back medications and reasons to contact MD/911. Patient verbalizes importance of PCP follow up appointment. Has pcp f/u on 9/1 at [facility]. Patient can demonstrate and teach back how to take his blood sugar with glucometer, draw up insulin and administer injection. Patient has been independtly taking his blood sugar and administering insulin throughout the shift. Patient can teachback symtoms of hypo and hyperglycemia. [name] has diabetes and is aware of nutritional restrictions and can teachback diabetic diet.  Parker West. Clelia Croft, RN

## 2014-10-31 NOTE — Progress Notes (Signed)
CBG - 408. MD notified. Verbal order from MD Rito Ehrlich) to give 30 units of insulin  Earnest Conroy. Clelia Croft, RN

## 2014-11-01 LAB — HEMOGLOBIN A1C
Hgb A1c MFr Bld: 13.2 % — ABNORMAL HIGH (ref 4.8–5.6)
MEAN PLASMA GLUCOSE: 332 mg/dL

## 2014-11-24 ENCOUNTER — Ambulatory Visit: Payer: 59 | Admitting: *Deleted

## 2015-08-17 ENCOUNTER — Encounter (HOSPITAL_COMMUNITY): Payer: Self-pay | Admitting: Emergency Medicine

## 2015-08-17 ENCOUNTER — Emergency Department (HOSPITAL_COMMUNITY)
Admission: EM | Admit: 2015-08-17 | Discharge: 2015-08-17 | Disposition: A | Discharge status: Home / Self Care | Payer: 59 | Attending: Emergency Medicine | Admitting: Emergency Medicine

## 2015-08-17 DIAGNOSIS — M109 Gout, unspecified: Secondary | ICD-10-CM | POA: Insufficient documentation

## 2015-08-17 DIAGNOSIS — E785 Hyperlipidemia, unspecified: Secondary | ICD-10-CM | POA: Insufficient documentation

## 2015-08-17 DIAGNOSIS — E119 Type 2 diabetes mellitus without complications: Secondary | ICD-10-CM | POA: Insufficient documentation

## 2015-08-17 DIAGNOSIS — Z794 Long term (current) use of insulin: Secondary | ICD-10-CM | POA: Diagnosis not present

## 2015-08-17 DIAGNOSIS — I10 Essential (primary) hypertension: Secondary | ICD-10-CM | POA: Insufficient documentation

## 2015-08-17 DIAGNOSIS — Z79899 Other long term (current) drug therapy: Secondary | ICD-10-CM | POA: Insufficient documentation

## 2015-08-17 HISTORY — DX: Type 2 diabetes mellitus without complications: E11.9

## 2015-08-17 LAB — I-STAT CHEM 8, ED
BUN: 14 mg/dL (ref 6–20)
CALCIUM ION: 1.1 mmol/L — AB (ref 1.12–1.23)
CHLORIDE: 94 mmol/L — AB (ref 101–111)
Creatinine, Ser: 1.6 mg/dL — ABNORMAL HIGH (ref 0.61–1.24)
Glucose, Bld: 431 mg/dL — ABNORMAL HIGH (ref 65–99)
HCT: 44 % (ref 39.0–52.0)
Hemoglobin: 15 g/dL (ref 13.0–17.0)
Potassium: 4.2 mmol/L (ref 3.5–5.1)
Sodium: 133 mmol/L — ABNORMAL LOW (ref 135–145)
TCO2: 25 mmol/L (ref 0–100)

## 2015-08-17 MED ORDER — INDOMETHACIN 50 MG PO CAPS: 50.0000 mg | ORAL_CAPSULE | Freq: Three times a day (TID) | ORAL | Status: DC | PRN

## 2015-08-17 NOTE — ED Provider Notes (Signed)
CSN: [id number]     Arrival date & time 08/17/15  1402 History  By signing my name below, I, Parker West, attest that this documentation has been prepared under the direction and in the presence of Parker West Y Parker West, [place]. Electronically Signed: Randa West, ED Scribe. 08/17/2015. 2:30 PM.    Chief Complaint  Patient presents with  . Gout   The history is provided by the patient. No language interpreter was used.   HPI Comments: Parker West is a 45 y.o. male who presents to the Emergency Department complaining of gout flare in his left great toe onset 3 days prior, though he states that his symptoms significantly got worse 1 day prior. Pt doesn't report any associated symptoms. He states that this feels like previous gout flares in the past. Pt states that he has not been compliant with taking his allopurinol due to running out and not getting the prescription refill. Pt states that he has tried at home cherry juice remedies with no relief. Pt does report alcohol use. Pt reports 2-3 beers each weekend. Denies increased red meat intake. Pt denies fever or chills. Pt denies injury or trauma. He states he knows he used to have kidney issues but has not had a kidney function test in "a while."  Past Medical History  Diagnosis Date  . Hypertension   . Hyperlipidemia   . Obesity   . Heart murmur     "when I was small"  . Anginal pain ([place])   . Pneumonia 1988    "walking"  . Staph infection 09/04/11    BLE/wife's report  . Hematochezia 10/09/2011    Low grade antedated DVT  . Microcytic normochromic anemia 10/09/2011    retic 1.6% Hb 9.9 10/09/11  . Dyspnea 10/09/2011    At rest  Recent DVT   . Diabetes mellitus without complication [facility])    Past Surgical History  Procedure Laterality Date  . Reduction mammaplasty  1990's  . Eye surgery      stye excision   Family History  Problem Relation Age of Onset  . Stroke Mother   . Hypertension Mother   . Diabetes Mother   . Hypertension  Father   . Ulcers Father     peptic  . Breast cancer Mother   . Colon cancer Maternal Aunt    Social History  Substance Use Topics  . Smoking status: Never Smoker   . Smokeless tobacco: Never Used  . Alcohol Use: Yes     Comment: 09/05/11 "Social drinker; nothing weekly"    Review of Systems  Constitutional: Negative for fever and chills.  Musculoskeletal: Positive for arthralgias.      Allergies  Review of patient's allergies indicates no known allergies.  Home Medications   Prior to Admission medications   Medication Sig Start Date End Date Taking? Authorizing Provider  acyclovir (ZOVIRAX) 800 MG tablet Take 400 mg by mouth daily.    Historical Provider, MD  allopurinol (ZYLOPRIM) 300 MG tablet Take 300 mg by mouth daily.    Historical Provider, MD  blood glucose meter kit and supplies KIT Dispense based on patient and insurance preference. Use up to four times daily as directed. (FOR ICD-9 250.00, 250.01). 10/31/14   Annita Brod, MD  buPROPion [facility] SR) 150 MG 12 hr tablet Take 150 mg by mouth 2 (two) times daily.    Historical Provider, MD  clonazePAM (KLONOPIN) 0.5 MG tablet Take 0.5-1 mg by mouth 2 (two) times daily. Takes  0.42m in the morning and 169mat bedtime    Historical Provider, MD  ferrous sulfate 325 (65 FE) MG tablet Take 325 mg by mouth daily with breakfast.    Historical Provider, MD  hydrochlorothiazide (HYDRODIURIL) 25 MG tablet Take 25 mg by mouth daily.    Historical Provider, MD  insulin aspart (NOVOLOG) 100 UNIT/ML injection Inject 0-20 Units into the skin 3 (three) times daily with meals. 10/31/14   SeAnnita BrodMD  insulin aspart (NOVOLOG) 100 UNIT/ML injection Inject 0-5 Units into the skin at bedtime. 10/31/14   SeAnnita BrodMD  insulin glargine (LANTUS) 100 UNIT/ML injection Inject 0.3 mLs (30 Units total) into the skin daily. 10/31/14   SeAnnita BrodMD  insulin lispro (HUMALOG) 100 UNIT/ML cartridge Inject 0.1 mLs (10 Units  total) into the skin 3 (three) times daily with meals. 10/31/14   SeAnnita BrodMD  insulin starter kit- syringes MISC 1 kit by Other route once. 10/31/14   SeAnnita BrodMD  losartan (COZAAR) 100 MG tablet Take 100 mg by mouth daily.    Historical Provider, MD  omeprazole (PRILOSEC) 20 MG capsule Take 20 mg by mouth daily before lunch.    Historical Provider, MD  predniSONE (DELTASONE) 20 MG tablet Take 20 mg by mouth 2 (two) times daily as needed (uses when qout flares up).    Historical Provider, MD  simvastatin (ZOCOR) 80 MG tablet Take 40 mg by mouth daily.    Historical Provider, MD  traZODone (DESYREL) 150 MG tablet Take 300 mg by mouth at bedtime.     Historical Provider, MD  Vitamin D, Ergocalciferol, (DRISDOL) 50000 UNITS CAPS capsule Take 50,000 Units by mouth every Sunday.    Historical Provider, MD   BP 123/94 mmHg  Pulse 98  Temp(Src) 98.3 F (36.8 C) (Oral)  Resp 18  SpO2 96%   Physical Exam  Constitutional: He is oriented to person, place, and time. He appears well-developed and well-nourished. No distress.  HENT:  Head: Normocephalic and atraumatic.  Eyes: Conjunctivae and EOM are normal.  Neck: Neck supple. No tracheal deviation present.  Cardiovascular: Normal rate.   Pulmonary/Chest: Effort normal. No respiratory distress.  Musculoskeletal: Normal range of motion.  Neurological: He is alert and oriented to person, place, and time.  Skin: Skin is warm and dry.  Left great toe mildly erythematous, diffusely exquisitely ttp. No tenderness rest of foot. Brisk cap refill.  Psychiatric: He has a normal mood and affect. His behavior is normal.  Nursing note and vitals reviewed.   ED Course  Procedures (including critical care time) DIAGNOSTIC STUDIES: Oxygen Saturation is 96% on RA, normal by my interpretation.    COORDINATION OF CARE: 2:31 PM-Discussed treatment plan with pt at bedside and pt agreed to plan.     Labs Review Labs Reviewed  I-STAT CHEM  8, ED - Abnormal; Notable for the following:    Sodium 133 (*)    Chloride 94 (*)    Creatinine, Ser 1.60 (*)    Glucose, Bld 431 (*)    Calcium, Ion 1.10 (*)    All other components within normal limits    Imaging Review No results found.    EKG Interpretation None      MDM   Final diagnoses:  Acute gout of left foot, unspecified cause  Podagra   Kidney function improved from prior.  Will give course of indomethacin. Instructed close f/u with PCP and monitoring kidney function. Also instructed to  resume allopurinol once flare has passed. ER return precautions given.   I personally performed the services described in this documentation, which was scribed in my presence. The recorded information has been reviewed and is accurate.      Anne Ng, PA-C 08/17/15 1532  Tanna Furry, MD 08/25/15 [phone]

## 2015-08-17 NOTE — Discharge Instructions (Signed)

## 2015-08-17 NOTE — ED Notes (Signed)
Patient c/o gout like symptoms in left big toe. Patient states that felt pain coming on Monday but symptoms got worse last night.

## 2015-08-29 ENCOUNTER — Emergency Department (HOSPITAL_COMMUNITY)
Admission: EM | Admit: 2015-08-29 | Discharge: 2015-08-30 | Disposition: A | Discharge status: Home / Self Care | Payer: 59 | Attending: Emergency Medicine | Admitting: Emergency Medicine

## 2015-08-29 ENCOUNTER — Encounter (HOSPITAL_COMMUNITY): Payer: Self-pay | Admitting: Emergency Medicine

## 2015-08-29 DIAGNOSIS — Z79899 Other long term (current) drug therapy: Secondary | ICD-10-CM | POA: Insufficient documentation

## 2015-08-29 DIAGNOSIS — I10 Essential (primary) hypertension: Secondary | ICD-10-CM | POA: Diagnosis not present

## 2015-08-29 DIAGNOSIS — E785 Hyperlipidemia, unspecified: Secondary | ICD-10-CM | POA: Diagnosis not present

## 2015-08-29 DIAGNOSIS — R739 Hyperglycemia, unspecified: Secondary | ICD-10-CM

## 2015-08-29 DIAGNOSIS — Z794 Long term (current) use of insulin: Secondary | ICD-10-CM | POA: Insufficient documentation

## 2015-08-29 DIAGNOSIS — N179 Acute kidney failure, unspecified: Secondary | ICD-10-CM | POA: Diagnosis not present

## 2015-08-29 DIAGNOSIS — Z791 Long term (current) use of non-steroidal anti-inflammatories (NSAID): Secondary | ICD-10-CM | POA: Insufficient documentation

## 2015-08-29 DIAGNOSIS — Z6841 Body Mass Index (BMI) 40.0 and over, adult: Secondary | ICD-10-CM | POA: Insufficient documentation

## 2015-08-29 DIAGNOSIS — Z7984 Long term (current) use of oral hypoglycemic drugs: Secondary | ICD-10-CM | POA: Insufficient documentation

## 2015-08-29 DIAGNOSIS — E1165 Type 2 diabetes mellitus with hyperglycemia: Secondary | ICD-10-CM | POA: Diagnosis not present

## 2015-08-29 DIAGNOSIS — E669 Obesity, unspecified: Secondary | ICD-10-CM | POA: Insufficient documentation

## 2015-08-29 LAB — URINALYSIS, ROUTINE W REFLEX MICROSCOPIC
Bilirubin Urine: NEGATIVE
Ketones, ur: NEGATIVE mg/dL
LEUKOCYTES UA: NEGATIVE
Nitrite: NEGATIVE
Protein, ur: 100 mg/dL — AB
SPECIFIC GRAVITY, URINE: 1.02 (ref 1.005–1.030)
pH: 5.5 (ref 5.0–8.0)

## 2015-08-29 LAB — URINE MICROSCOPIC-ADD ON

## 2015-08-29 LAB — CBG MONITORING, ED
GLUCOSE-CAPILLARY: 434 mg/dL — AB (ref 65–99)
GLUCOSE-CAPILLARY: 380 mg/dL — AB (ref 65–99)
Glucose-Capillary: 385 mg/dL — ABNORMAL HIGH (ref 65–99)
Glucose-Capillary: 394 mg/dL — ABNORMAL HIGH (ref 65–99)

## 2015-08-29 LAB — BASIC METABOLIC PANEL
ANION GAP: 11 (ref 5–15)
BUN: 27 mg/dL — ABNORMAL HIGH (ref 6–20)
CHLORIDE: 95 mmol/L — AB (ref 101–111)
CO2: 24 mmol/L (ref 22–32)
Calcium: 9.1 mg/dL (ref 8.9–10.3)
Creatinine, Ser: 2.25 mg/dL — ABNORMAL HIGH (ref 0.61–1.24)
GFR calc non Af Amer: 33 mL/min — ABNORMAL LOW (ref >60)
GFR, EST AFRICAN AMERICAN: 39 mL/min — AB (ref >60)
Glucose, Bld: 447 mg/dL — ABNORMAL HIGH (ref 65–99)
Potassium: 3.9 mmol/L (ref 3.5–5.1)
Sodium: 130 mmol/L — ABNORMAL LOW (ref 135–145)

## 2015-08-29 LAB — CBC
HCT: 41.3 % (ref 39.0–52.0)
HEMOGLOBIN: 14 g/dL (ref 13.0–17.0)
MCH: 23.6 pg — AB (ref 26.0–34.0)
MCHC: 33.9 g/dL (ref 30.0–36.0)
MCV: 69.8 fL — AB (ref 78.0–100.0)
Platelets: 373 10*3/uL (ref 150–400)
RBC: 5.92 MIL/uL — AB (ref 4.22–5.81)
RDW: 16.3 % — ABNORMAL HIGH (ref 11.5–15.5)
WBC: 18.3 10*3/uL — AB (ref 4.0–10.5)

## 2015-08-29 MED ORDER — HYDROCODONE-ACETAMINOPHEN 5-325 MG PO TABS: 1.0000 | ORAL_TABLET | Freq: Four times a day (QID) | ORAL | Status: DC | PRN

## 2015-08-29 MED ORDER — SODIUM CHLORIDE 0.9 % IV BOLUS (SEPSIS)
1000.0000 mL | Freq: Once | INTRAVENOUS | Status: AC
  Administered 2015-08-29: 1000 mL via INTRAVENOUS

## 2015-08-29 MED ORDER — INSULIN ASPART 100 UNIT/ML ~~LOC~~ SOLN
10.0000 [IU] | Freq: Once | SUBCUTANEOUS | Status: AC
  Administered 2015-08-29: 10 [IU] via INTRAVENOUS
  Filled 2015-08-29: qty 1

## 2015-08-29 NOTE — Discharge Instructions (Signed)
Start taking the 4 tablets of metformin like you were doing.  Use the 10units of insulin 2 times a day like your doctor asked but then use sliding scale for persistently high blood sugar.  Try to follow a diabetic diet Acute Kidney Injury Acute kidney injury is any condition in which there is sudden (acute) damage to the kidneys. Acute kidney injury was previously known as acute kidney failure or acute renal failure. The kidneys are two organs that lie on either side of the spine between the middle of the back and the front of the abdomen. The kidneys:  Remove wastes and extra water from the blood.   Produce important hormones. These help keep bones strong, regulate blood pressure, and help create red blood cells.   Balance the fluids and chemicals in the blood and tissues. A small amount of kidney damage may not cause problems, but a large amount of damage may make it difficult or impossible for the kidneys to work the way they should. Acute kidney injury may develop into long-lasting (chronic) kidney disease. It may also develop into a life-threatening disease called end-stage kidney disease. Acute kidney injury can get worse very quickly, so it should be treated right away. Early treatment may prevent other kidney diseases from developing. CAUSES   A problem with blood flow to the kidneys. This may be caused by:   Blood loss.   Heart disease.   Severe burns.   Liver disease.  Direct damage to the kidneys. This may be caused by:  Some medicines.   A kidney infection.   Poisoning or consuming toxic substances.   A surgical wound.   A blow to the kidney area.   A problem with urine flow. This may be caused by:   Cancer.   Kidney stones.   An enlarged prostate. SIGNS AND SYMPTOMS   Swelling (edema) of the legs, ankles, or feet.   Tiredness (lethargy).   Nausea or vomiting.   Confusion.   Problems with urination, such as:   Painful or burning  feeling during urination.   Decreased urine production.   Frequent accidents in children who are potty trained.   Bloody urine.   Muscle twitches and cramps.   Shortness of breath.   Seizures.   Chest pain or pressure. Sometimes, no symptoms are present. DIAGNOSIS Acute kidney injury may be detected and diagnosed by tests, including blood, urine, imaging, or kidney biopsy tests.  TREATMENT Treatment of acute kidney injury varies depending on the cause and severity of the kidney damage. In mild cases, no treatment may be needed. The kidneys may heal on their own. If acute kidney injury is more severe, your health care provider will treat the cause of the kidney damage, help the kidneys heal, and prevent complications from occurring. Severe cases may require a procedure to remove toxic wastes from the body (dialysis) or surgery to repair kidney damage. Surgery may involve:   Repair of a torn kidney.   Removal of an obstruction. HOME CARE INSTRUCTIONS  Follow your prescribed diet.  Take medicines only as directed by your health care provider.  Do not take any new medicines (prescription, over-the-counter, or nutritional supplements) unless approved by your health care provider. Many medicines can worsen your kidney damage or may need to have the dose adjusted.   Keep all follow-up visits as directed by your health care provider. This is important.  Observe your condition to make sure you are healing as expected. SEEK IMMEDIATE MEDICAL CARE  IF:  You are feeling ill or have severe pain in the back or side.   Your symptoms return or you have new symptoms.  You have any symptoms of end-stage kidney disease. These include:   Persistent itchiness.   Loss of appetite.   Headaches.   Abnormally dark or light skin.  Numbness in the hands or feet.   Easy bruising.   Frequent hiccups.   Menstruation stops.   You have a fever.  You have increased  urine production.  You have pain or bleeding when urinating. MAKE SURE YOU:   Understand these instructions.  Will watch your condition.  Will get help right away if you are not doing well or get worse.   This information is not intended to replace advice given to you by your health care provider. Make sure you discuss any questions you have with your health care provider.   Document Released: 09/03/2010 Document Revised: 03/11/2014 Document Reviewed: 10/18/2011 Elsevier Interactive Patient Education [employer]2016 Elsevier Inc.

## 2015-08-29 NOTE — ED Notes (Signed)
Pt states the past 2 weeks he has had trouble controlling his blood sugar.  At home he takes insulin and metformin.  States he is not sure how high his sugars have been because his machine has just said "high".  States he has had some flank pain as well.

## 2015-08-29 NOTE — ED Provider Notes (Addendum)
CSN: [id number]     Arrival date & time 08/29/15  1804 History   First MD Initiated Contact with Patient 08/29/15 1835     Chief Complaint  Patient presents with  . Hyperglycemia     (Consider location/radiation/quality/duration/timing/severity/associated sxs/prior Treatment) Patient is a 45 y.o. male presenting with hyperglycemia.  Hyperglycemia Blood sugar level PTA:  450 Severity:  Moderate Onset quality:  Gradual Duration:  2 weeks Timing:  Constant Progression:  Worsening Chronicity:  New Diabetes status:  Controlled with insulin and controlled with oral medications Current diabetic therapy:  2 metformin tablets daily and 10 units of insulin in the morning and night. 3 weeks ago he was on sliding scale insulin if your metformin tablets daily Time since last antidiabetic medication:  4 hours Context: change in medication   Relieved by:  Nothing Ineffective treatments: He is started to use sliding scale insulin but is not improving his blood sugar. Associated symptoms: increased thirst and polyuria   Associated symptoms: no abdominal pain, no altered mental status, no blurred vision, no confusion, no dehydration, no dizziness, no dysuria, no nausea, no shortness of breath, no vomiting and no weakness   Risk factors: obesity   Risk factors: no recent steroid use   Risk factors comment:  Recently had been on indomethacin for gout   Past Medical History  Diagnosis Date  . Hypertension   . Hyperlipidemia   . Obesity   . Heart murmur     "when I was small"  . Anginal pain ([place])   . Pneumonia 1988    "walking"  . Staph infection 09/04/11    BLE/wife's report  . Hematochezia 10/09/2011    Low grade antedated DVT  . Microcytic normochromic anemia 10/09/2011    retic 1.6% Hb 9.9 10/09/11  . Dyspnea 10/09/2011    At rest  Recent DVT   . Diabetes mellitus without complication [facility])    Past Surgical History  Procedure Laterality Date  . Reduction mammaplasty  1990's  . Eye surgery       stye excision   Family History  Problem Relation Age of Onset  . Stroke Mother   . Hypertension Mother   . Diabetes Mother   . Hypertension Father   . Ulcers Father     peptic  . Breast cancer Mother   . Colon cancer Maternal Aunt    Social History  Substance Use Topics  . Smoking status: Never Smoker   . Smokeless tobacco: Never Used  . Alcohol Use: Yes     Comment: socially    Review of Systems  Eyes: Negative for blurred vision.  Respiratory: Negative for shortness of breath.   Gastrointestinal: Negative for nausea, vomiting and abdominal pain.  Endocrine: Positive for polydipsia and polyuria.  Genitourinary: Negative for dysuria.  Neurological: Negative for dizziness and weakness.  Psychiatric/Behavioral: Negative for confusion.  All other systems reviewed and are negative.     Allergies  Review of patient's allergies indicates no known allergies.  Home Medications   Prior to Admission medications   Medication Sig Start Date End Date Taking? Authorizing Provider  allopurinol (ZYLOPRIM) 300 MG tablet Take 300 mg by mouth daily.   Yes Historical Provider, MD  blood glucose meter kit and supplies KIT Dispense based on patient and insurance preference. Use up to four times daily as directed. (FOR ICD-9 250.00, 250.01). 10/31/14  Yes Annita Brod, MD  buPROPion [facility] SR) 150 MG 12 hr tablet Take 150 mg by mouth 2 (  two) times daily.   Yes Historical Provider, MD  clonazePAM (KLONOPIN) 0.5 MG tablet Take 0.5-1 mg by mouth 2 (two) times daily. Takes 0.81m in the morning and 177mat bedtime   Yes Historical Provider, MD  ferrous sulfate 325 (65 FE) MG tablet Take 325 mg by mouth daily with breakfast.   Yes Historical Provider, MD  hydrochlorothiazide (HYDRODIURIL) 25 MG tablet Take 25 mg by mouth daily.   Yes Historical Provider, MD  ibuprofen (ADVIL,MOTRIN) 200 MG tablet Take 400 mg by mouth every 6 (six) hours as needed for fever, headache, mild pain,  moderate pain or cramping.   Yes Historical Provider, MD  indomethacin (INDOCIN) 50 MG capsule Take 1 capsule (50 mg total) by mouth 3 (three) times daily as needed (gout). 08/17/15  Yes SeOlivia Canteram, PA-C  insulin NPH-regular Human (NOVOLIN 70/30) (70-30) 100 UNIT/ML injection Inject 10 Units into the skin 2 (two) times daily with a meal.   Yes Historical Provider, MD  insulin starter kit- syringes MISC 1 kit by Other route once. 10/31/14  Yes SeAnnita BrodMD  losartan (COZAAR) 100 MG tablet Take 100 mg by mouth daily.   Yes Historical Provider, MD  metFORMIN (GLUCOPHAGE-XR) 500 MG 24 hr tablet Take 500 mg by mouth 2 (two) times daily.   Yes Historical Provider, MD  omeprazole (PRILOSEC) 20 MG capsule Take 20 mg by mouth daily as needed (for acid reflex).    Yes Historical Provider, MD  predniSONE (DELTASONE) 20 MG tablet Take 20 mg by mouth 2 (two) times daily as needed (uses when qout flares up).   Yes Historical Provider, MD  simvastatin (ZOCOR) 80 MG tablet Take 40 mg by mouth daily.   Yes Historical Provider, MD  traZODone (DESYREL) 100 MG tablet Take 400 mg by mouth at bedtime.   Yes Historical Provider, MD  insulin aspart (NOVOLOG) 100 UNIT/ML injection Inject 0-20 Units into the skin 3 (three) times daily with meals. Patient not taking: Reported on 08/29/2015 10/31/14   SeAnnita BrodMD  insulin aspart (NOVOLOG) 100 UNIT/ML injection Inject 0-5 Units into the skin at bedtime. Patient not taking: Reported on 08/29/2015 10/31/14   SeAnnita BrodMD  insulin glargine (LANTUS) 100 UNIT/ML injection Inject 0.3 mLs (30 Units total) into the skin daily. Patient not taking: Reported on 08/29/2015 10/31/14   SeAnnita BrodMD  insulin lispro (HUMALOG) 100 UNIT/ML cartridge Inject 0.1 mLs (10 Units total) into the skin 3 (three) times daily with meals. Patient not taking: Reported on 08/29/2015 10/31/14   SeAnnita BrodMD   BP 140/102 mmHg  Pulse 113  Temp(Src) 97.9 F (36.6 C)  (Oral)  Resp 18  Ht '5\' 11"'  (1.803 m)  Wt 335 lb (151.955 kg)  BMI 46.74 kg/m2  SpO2 100% Physical Exam  Constitutional: He is oriented to person, place, and time. He appears well-developed and well-nourished. No distress.  HENT:  Head: Normocephalic and atraumatic.  Mouth/Throat: Oropharynx is clear and moist.  Eyes: Conjunctivae and EOM are normal. Pupils are equal, round, and reactive to light.  Neck: Normal range of motion. Neck supple.  Cardiovascular: Regular rhythm and intact distal pulses.  Tachycardia present.   No murmur heard. Pulmonary/Chest: Effort normal and breath sounds normal. No respiratory distress. He has no wheezes. He has no rales.  Abdominal: Soft. He exhibits no distension. There is no tenderness. There is no rebound and no guarding.  Musculoskeletal: Normal range of motion. He exhibits no edema or  tenderness.  Pain over the left and right great toe  Neurological: He is alert and oriented to person, place, and time.  Skin: Skin is warm and dry. No rash noted. No erythema.  Psychiatric: He has a normal mood and affect. His behavior is normal.  Nursing note and vitals reviewed.   ED Course  Procedures (including critical care time) Labs Review Labs Reviewed  BASIC METABOLIC PANEL - Abnormal; Notable for the following:    Sodium 130 (*)    Chloride 95 (*)    Glucose, Bld 447 (*)    BUN 27 (*)    Creatinine, Ser 2.25 (*)    GFR calc non Af Amer 33 (*)    GFR calc Af Amer 39 (*)    All other components within normal limits  CBC - Abnormal; Notable for the following:    WBC 18.3 (*)    RBC 5.92 (*)    MCV 69.8 (*)    MCH 23.6 (*)    RDW 16.3 (*)    All other components within normal limits  URINALYSIS, ROUTINE W REFLEX MICROSCOPIC (NOT AT [facility]) - Abnormal; Notable for the following:    Glucose, UA >1000 (*)    Hgb urine dipstick SMALL (*)    Protein, ur 100 (*)    All other components within normal limits  URINE MICROSCOPIC-ADD ON - Abnormal; Notable  for the following:    Squamous Epithelial / LPF 0-5 (*)    Bacteria, UA RARE (*)    All other components within normal limits  CBG MONITORING, ED - Abnormal; Notable for the following:    Glucose-Capillary 434 (*)    All other components within normal limits  CBG MONITORING, ED - Abnormal; Notable for the following:    Glucose-Capillary 385 (*)    All other components within normal limits  CBG MONITORING, ED - Abnormal; Notable for the following:    Glucose-Capillary 394 (*)    All other components within normal limits  CBG MONITORING, ED - Abnormal; Notable for the following:    Glucose-Capillary 380 (*)    All other components within normal limits    Imaging Review No results found. I have personally reviewed and evaluated these images and lab results as part of my medical decision-making.   EKG Interpretation None      MDM   Final diagnoses:  Hyperglycemia  Acute kidney injury [facility])    Patient is a 45 year old male presenting today with persistent hyperglycemia. He recently proximal leg 3 weeks ago had his metformin changed to decreased only 2 pills a day instead of 4. Also they wanted him to do insulin 10 units in the morning and night and not do sliding scale. Since that time his blood sugars of started running in the 400s. He denies any other symptoms such as abdominal pain, nausea or vomiting. He denies any persistent back pain. He was recently treated a few weeks ago for gout and has been continually taking indomethacin intermittently for the pain in his toe.  He has an appointment on Friday with his doctor but because his sugar was not improving he decided to come to be evaluated. Patient is tachycardic but otherwise exam is normal. Initial blood sugar here was 434. UA with sugar but no evidence of infection or blood. Patient has a leukocytosis of 18,000 of unknown significance and BMP today showing AK I which is most likely related to dehydration and the use of  indomethacin for the gout. He displays  no signs of DKA and has an anion gap of 11. After his first liter of saline heart rate has improved and blood sugar is 385  11:44 PM After multiple doses of IV fluids and IV insulin patient's blood sugars 380. Suggested he go back to taking metformin 4 tablets a day. Also he is going to use 10 mg of insulin twice a day and then a sliding scale. He is not going to use indomethacin anymore for his gout and he was given some pain medication.  Patient has a follow-up appointment with his doctor on Friday. He was instructed to return if he develops fever, abdominal pain, vomiting or any other concerns. Patient is requesting to go home. When I was in the room his vital signs had improved to a heart rate of 105. He currently has no complaints.  Blanchie Dessert, MD 08/29/15 [place], MD 08/29/15 2348

## 2015-08-29 NOTE — ED Notes (Signed)
RN at bedside placing IV will obtain labs

## 2015-08-30 MED ORDER — HYDROCODONE-ACETAMINOPHEN 5-325 MG PO TABS
1.0000 | ORAL_TABLET | Freq: Once | ORAL | Status: AC
  Administered 2015-08-30: 1 via ORAL
  Filled 2015-08-30: qty 1

## 2016-09-01 ENCOUNTER — Encounter (HOSPITAL_COMMUNITY): Payer: Self-pay

## 2016-09-01 ENCOUNTER — Emergency Department (HOSPITAL_COMMUNITY)
Admission: EM | Admit: 2016-09-01 | Discharge: 2016-09-01 | Disposition: A | Discharge status: Home / Self Care | Payer: 59 | Attending: Emergency Medicine | Admitting: Emergency Medicine

## 2016-09-01 DIAGNOSIS — Z794 Long term (current) use of insulin: Secondary | ICD-10-CM | POA: Diagnosis not present

## 2016-09-01 DIAGNOSIS — R000 Tachycardia, unspecified: Secondary | ICD-10-CM | POA: Diagnosis not present

## 2016-09-01 DIAGNOSIS — Z79899 Other long term (current) drug therapy: Secondary | ICD-10-CM | POA: Insufficient documentation

## 2016-09-01 DIAGNOSIS — R739 Hyperglycemia, unspecified: Secondary | ICD-10-CM

## 2016-09-01 DIAGNOSIS — N289 Disorder of kidney and ureter, unspecified: Secondary | ICD-10-CM | POA: Insufficient documentation

## 2016-09-01 DIAGNOSIS — E1165 Type 2 diabetes mellitus with hyperglycemia: Secondary | ICD-10-CM | POA: Insufficient documentation

## 2016-09-01 DIAGNOSIS — I10 Essential (primary) hypertension: Secondary | ICD-10-CM | POA: Insufficient documentation

## 2016-09-01 DIAGNOSIS — E860 Dehydration: Secondary | ICD-10-CM

## 2016-09-01 LAB — BASIC METABOLIC PANEL
ANION GAP: 14 (ref 5–15)
BUN: 24 mg/dL — ABNORMAL HIGH (ref 6–20)
CALCIUM: 9 mg/dL (ref 8.9–10.3)
CO2: 20 mmol/L — AB (ref 22–32)
Chloride: 99 mmol/L — ABNORMAL LOW (ref 101–111)
Creatinine, Ser: 2.08 mg/dL — ABNORMAL HIGH (ref 0.61–1.24)
GFR, EST AFRICAN AMERICAN: 42 mL/min — AB (ref >60)
GFR, EST NON AFRICAN AMERICAN: 37 mL/min — AB (ref >60)
Glucose, Bld: 624 mg/dL (ref 65–99)
Potassium: 4 mmol/L (ref 3.5–5.1)
SODIUM: 133 mmol/L — AB (ref 135–145)

## 2016-09-01 LAB — CBC
HCT: 41.4 % (ref 39.0–52.0)
HEMOGLOBIN: 14.3 g/dL (ref 13.0–17.0)
MCH: 24.6 pg — ABNORMAL LOW (ref 26.0–34.0)
MCHC: 34.5 g/dL (ref 30.0–36.0)
MCV: 71.3 fL — ABNORMAL LOW (ref 78.0–100.0)
PLATELETS: 244 10*3/uL (ref 150–400)
RBC: 5.81 MIL/uL (ref 4.22–5.81)
RDW: 16.2 % — ABNORMAL HIGH (ref 11.5–15.5)
WBC: 13.3 10*3/uL — AB (ref 4.0–10.5)

## 2016-09-01 LAB — URINALYSIS, ROUTINE W REFLEX MICROSCOPIC
BILIRUBIN URINE: NEGATIVE
Bacteria, UA: NONE SEEN
HGB URINE DIPSTICK: NEGATIVE
Ketones, ur: 20 mg/dL — AB
LEUKOCYTES UA: NEGATIVE
NITRITE: NEGATIVE
PROTEIN: 100 mg/dL — AB
Specific Gravity, Urine: 1.018 (ref 1.005–1.030)
Squamous Epithelial / LPF: NONE SEEN
pH: 6 (ref 5.0–8.0)

## 2016-09-01 LAB — CBG MONITORING, ED
GLUCOSE-CAPILLARY: 581 mg/dL — AB (ref 65–99)
Glucose-Capillary: 537 mg/dL (ref 65–99)
Glucose-Capillary: 533 mg/dL (ref 65–99)
Glucose-Capillary: 427 mg/dL — ABNORMAL HIGH (ref 65–99)

## 2016-09-01 MED ORDER — SODIUM CHLORIDE 0.9 % IV BOLUS (SEPSIS)
1000.0000 mL | Freq: Once | INTRAVENOUS | Status: AC
  Administered 2016-09-01: 1000 mL via INTRAVENOUS

## 2016-09-01 MED ORDER — SODIUM CHLORIDE 0.9 % IV SOLN
INTRAVENOUS | Status: DC
  Administered 2016-09-01: 4.8 [IU]/h via INTRAVENOUS
  Filled 2016-09-01: qty 1

## 2016-09-01 NOTE — ED Triage Notes (Signed)
States blood sugar has been running high for about a week went on vacation and did not seek help for high blood sugar.

## 2016-09-01 NOTE — ED Notes (Signed)
Critical glucose of 624.  Dr. Bebe ShaggyWickline notified.  No new orders at this time.

## 2016-09-01 NOTE — ED Provider Notes (Signed)
[facility] DEPT Provider Note   CSN: [id number] Arrival date & time: 09/01/16  0006  By signing my name below, I, Theresia Bough, attest that this documentation has been prepared under the direction and in the presence of Ripley Fraise, MD. Electronically Signed: Theresia Bough, ED Scribe. 09/01/16. 1:36 AM.  History   Chief Complaint Chief Complaint  Patient presents with  . Hyperglycemia    HPI Parker West is a 46 y.o. male.  The history is provided by the patient. No language interpreter was used.  Hyperglycemia  Blood sugar level PTA:  581 Severity:  Moderate Onset quality:  Gradual Duration:  6 days Timing:  Constant Progression:  Unchanged Chronicity:  New Diabetes status:  Controlled with insulin Time since last antidiabetic medication:  6 days Context: noncompliance   Context comment:  Went away on vacation and forgot meds Ineffective treatments:  None tried Associated symptoms: increased thirst   Associated symptoms: no chest pain, no fever, no shortness of breath and no vomiting   Risk factors: obesity     Past Medical History:  Diagnosis Date  . Anginal pain ([place])   . Diabetes mellitus without complication ([place])   . Dyspnea 10/09/2011   At rest  Recent DVT   . Heart murmur    "when I was small"  . Hematochezia 10/09/2011   Low grade antedated DVT  . Hyperlipidemia   . Hypertension   . Microcytic normochromic anemia 10/09/2011   retic 1.6% Hb 9.9 10/09/11  . Obesity   . Pneumonia 1988   "walking"  . Staph infection 09/04/11   BLE/wife's report    Patient Active Problem List   Diagnosis Date Noted  . Diabetic hyperosmolar non-ketotic state ([place]) 10/30/2014  . Hyperglycemia 10/30/2014  . AKI (acute kidney injury) ([place]) 10/30/2014  . Leukocytosis 10/30/2014  . Hyperlipidemia 10/30/2014  . New onset type 2 diabetes mellitus ([place])   . Morbid obesity ([place])   . Hematochezia 10/09/2011  . Microcytic normochromic anemia 10/09/2011  . Dyspnea  10/09/2011  . Edema 09/11/2011  . DVT (deep venous thrombosis) ([place]) 09/06/2011  . Cellulitis of multiple sites of lower extremity 09/05/2011  . HTN (hypertension) 09/05/2011  . Dermatosis 09/05/2011  . Essential hypertension 02/17/2009  . 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 EYE SURGERY     stye excision  . REDUCTION MAMMAPLASTY  1990's       Home Medications    Prior to Admission medications   Medication Sig Start Date End Date Taking? Authorizing Provider  allopurinol (ZYLOPRIM) 300 MG tablet Take 300 mg by mouth daily as needed (gout).    Yes [provider]  blood glucose meter kit and supplies KIT Dispense based on patient and insurance preference. Use up to four times daily as directed. (FOR ICD-9 250.00, 250.01). 10/31/14  Yes Annita Brod, MD  clonazePAM (KLONOPIN) 0.5 MG tablet Take 0.5-1 mg by mouth 2 (two) times daily as needed for anxiety.    Yes [provider]  ferrous sulfate 325 (65 FE) MG tablet Take 325 mg by mouth daily with breakfast.   Yes [provider]  insulin NPH-regular Human (NOVOLIN 70/30) (70-30) 100 UNIT/ML injection Inject 10 Units into the skin 2 (two) times daily with a meal.   Yes [provider]  insulin starter kit- syringes MISC 1 kit by Other route once. 10/31/14  Yes Annita Brod, MD  losartan (COZAAR) 100 MG tablet Take  100 mg by mouth daily.   Yes [provider]  metFORMIN (GLUCOPHAGE-XR) 500 MG 24 hr tablet Take 500 mg by mouth 2 (two) times daily.   Yes [provider]  predniSONE (DELTASONE) 20 MG tablet Take 20 mg by mouth 2 (two) times daily as needed (uses when gout flares up).    Yes [provider]  simvastatin (ZOCOR) 80 MG tablet Take 40 mg by mouth daily.   Yes [provider]  traZODone (DESYREL) 100 MG tablet Take 400 mg by mouth at bedtime.   Yes [provider]  indomethacin  (INDOCIN) 50 MG capsule Take 1 capsule (50 mg total) by mouth 3 (three) times daily as needed (gout). Patient not taking: Reported on 09/01/2016 08/17/15   Sam, Olivia Canter, PA-C  insulin aspart (NOVOLOG) 100 UNIT/ML injection Inject 0-20 Units into the skin 3 (three) times daily with meals. Patient not taking: Reported on 08/29/2015 10/31/14   Annita Brod, MD  insulin aspart (NOVOLOG) 100 UNIT/ML injection Inject 0-5 Units into the skin at bedtime. Patient not taking: Reported on 08/29/2015 10/31/14   Annita Brod, MD  insulin glargine (LANTUS) 100 UNIT/ML injection Inject 0.3 mLs (30 Units total) into the skin daily. Patient not taking: Reported on 08/29/2015 10/31/14   Annita Brod, MD  insulin lispro (HUMALOG) 100 UNIT/ML cartridge Inject 0.1 mLs (10 Units total) into the skin 3 (three) times daily with meals. Patient not taking: Reported on 08/29/2015 10/31/14   Annita Brod, MD    Family History Family History  Problem Relation Age of Onset  . Stroke Mother   . Hypertension Mother   . Diabetes Mother   . Breast cancer Mother   . Hypertension Father   . Ulcers Father        peptic  . Colon cancer Maternal Aunt     Social History Social History  Substance Use Topics  . Smoking status: Never Smoker  . Smokeless tobacco: Never Used  . Alcohol use Yes     Comment: socially     Allergies   Patient has no known allergies.   Review of Systems Review of Systems  Constitutional: Negative for fever.  Respiratory: Negative for cough and shortness of breath.   Cardiovascular: Negative for chest pain.  Gastrointestinal: Negative for vomiting.  Endocrine: Positive for polydipsia.  Genitourinary: Positive for frequency.  Musculoskeletal: Negative for back pain.  Neurological: Negative for headaches.  All other systems reviewed and are negative.    Physical Exam Updated Vital Signs BP (!) 136/107 (BP Location: Left Arm)   Pulse 94   Temp 98.1 F (36.7 C)  (Oral)   Resp 17   Wt (!) 335 lb (152 kg)   SpO2 97%   BMI 46.72 kg/m   Physical Exam  CONSTITUTIONAL: Well developed/well nourished HEAD: Normocephalic/atraumatic EYES: EOMI/PERRL ENMT: Mucous membranes moist NECK: supple no meningeal signs SPINE/BACK:entire spine nontender CV: S1/S2 noted, no murmurs/rubs/gallops noted LUNGS: Lungs are clear to auscultation bilaterally, no apparent distress ABDOMEN: soft, nontender, no rebound or guarding, bowel sounds noted throughout abdomen, obese GU:no cva tenderness NEURO: Pt is awake/alert/appropriate, moves all extremitiesx4.  No facial droop.   EXTREMITIES: pulses normal/equal, full ROM SKIN: warm, color normal PSYCH: no abnormalities of mood noted, alert and oriented to situation   ED Treatments / Results  DIAGNOSTIC STUDIES: Oxygen Saturation is 99% on RA, normal by my interpretation.   COORDINATION OF CARE: 1:31 AM-Discussed next steps with pt including IV fluids. Pt  verbalized understanding and is agreeable with the plan.   Labs (all labs ordered are listed, but only abnormal results are displayed) Labs Reviewed  BASIC METABOLIC PANEL - Abnormal; Notable for the following:       Result Value   Sodium 133 (*)    Chloride 99 (*)    CO2 20 (*)    Glucose, Bld 624 (*)    BUN 24 (*)    Creatinine, Ser 2.08 (*)    GFR calc non Af Amer 37 (*)    GFR calc Af Amer 42 (*)    All other components within normal limits  CBC - Abnormal; Notable for the following:    WBC 13.3 (*)    MCV 71.3 (*)    MCH 24.6 (*)    RDW 16.2 (*)    All other components within normal limits  URINALYSIS, ROUTINE W REFLEX MICROSCOPIC - Abnormal; Notable for the following:    Color, Urine COLORLESS (*)    Glucose, UA >=500 (*)    Ketones, ur 20 (*)    Protein, ur 100 (*)    All other components within normal limits  CBG MONITORING, ED - Abnormal; Notable for the following:    Glucose-Capillary >600 (*)    All other components within normal limits   CBG MONITORING, ED - Abnormal; Notable for the following:    Glucose-Capillary 581 (*)    All other components within normal limits  CBG MONITORING, ED - Abnormal; Notable for the following:    Glucose-Capillary 537 (*)    All other components within normal limits  CBG MONITORING, ED - Abnormal; Notable for the following:    Glucose-Capillary 533 (*)    All other components within normal limits    EKG  EKG Interpretation  Date/Time:  Sunday September 01 2016 01:37:00 EDT Ventricular Rate:  122 PR Interval:    QRS Duration: 86 QT Interval:  346 QTC Calculation: 493 R Axis:   6 Text Interpretation:  Sinus tachycardia Ventricular premature complex Low voltage, precordial leads Borderline T abnormalities, anterior leads Borderline prolonged QT interval Confirmed by Ripley Fraise [phone]) on 09/01/2016 2:07:48 AM       Radiology No results found.  Procedures Procedures (including critical care time)  Medications Ordered in ED Medications  insulin regular (NOVOLIN R,HUMULIN R) 100 Units in sodium chloride 0.9 % 100 mL (1 Units/mL) infusion (9.5 Units/hr Intravenous Rate/Dose Change 09/01/16 0544)  sodium chloride 0.9 % bolus 1,000 mL (0 mLs Intravenous Stopped 09/01/16 0348)  sodium chloride 0.9 % bolus 1,000 mL (1,000 mLs Intravenous New Bag/Given 09/01/16 0349)     Initial Impression / Assessment and Plan / ED Course  I have reviewed the triage vital signs and the nursing notes.  Pertinent labs  results that were available during my care of the patient were reviewed by me and considered in my medical decision making (see chart for details).     Pt in the ED for hyperglycemia due to noncompliance while on vacation He had some response to IV fluids/insulin but still >500 No signs of DKA He is in no distress Denies cp/abd pain He is ambulatory  He is requesting discharge He reports he will restart his home insulin Plan to f/u with [facility] PCP next week We discussed strict return  precautions   Final Clinical Impressions(s) / ED Diagnoses   Final diagnoses:  Hyperglycemia  Renal insufficiency  Dehydration    New Prescriptions New Prescriptions   No medications on file  I personally performed the services described in this documentation, which was scribed in my presence. The recorded information has been reviewed and is accurate.        Ripley Fraise, MD 09/01/16 [phone]

## 2016-09-01 NOTE — Discharge Instructions (Signed)
[facility] ONE WEEK IF YOUR SUGARS CONTINUE TO RISE

## 2017-08-06 ENCOUNTER — Encounter (HOSPITAL_COMMUNITY): Payer: Self-pay | Admitting: Emergency Medicine

## 2017-08-06 ENCOUNTER — Emergency Department (HOSPITAL_COMMUNITY): Payer: 59

## 2017-08-06 ENCOUNTER — Emergency Department (HOSPITAL_COMMUNITY)
Admission: EM | Admit: 2017-08-06 | Discharge: 2017-08-07 | Disposition: A | Discharge status: Home / Self Care | Payer: 59 | Attending: Emergency Medicine | Admitting: Emergency Medicine

## 2017-08-06 DIAGNOSIS — E1165 Type 2 diabetes mellitus with hyperglycemia: Secondary | ICD-10-CM | POA: Diagnosis not present

## 2017-08-06 DIAGNOSIS — Z79899 Other long term (current) drug therapy: Secondary | ICD-10-CM | POA: Insufficient documentation

## 2017-08-06 DIAGNOSIS — Z794 Long term (current) use of insulin: Secondary | ICD-10-CM | POA: Insufficient documentation

## 2017-08-06 DIAGNOSIS — I10 Essential (primary) hypertension: Secondary | ICD-10-CM | POA: Diagnosis not present

## 2017-08-06 DIAGNOSIS — K760 Fatty (change of) liver, not elsewhere classified: Secondary | ICD-10-CM | POA: Diagnosis not present

## 2017-08-06 DIAGNOSIS — R000 Tachycardia, unspecified: Secondary | ICD-10-CM | POA: Diagnosis not present

## 2017-08-06 DIAGNOSIS — Z7982 Long term (current) use of aspirin: Secondary | ICD-10-CM | POA: Insufficient documentation

## 2017-08-06 DIAGNOSIS — R1084 Generalized abdominal pain: Secondary | ICD-10-CM | POA: Diagnosis present

## 2017-08-06 DIAGNOSIS — R739 Hyperglycemia, unspecified: Secondary | ICD-10-CM

## 2017-08-06 LAB — URINALYSIS, ROUTINE W REFLEX MICROSCOPIC
Bacteria, UA: NONE SEEN
Bilirubin Urine: NEGATIVE
Ketones, ur: NEGATIVE mg/dL
LEUKOCYTES UA: NEGATIVE
NITRITE: NEGATIVE
Protein, ur: 30 mg/dL — AB
SPECIFIC GRAVITY, URINE: 1.013 (ref 1.005–1.030)
pH: 5 (ref 5.0–8.0)

## 2017-08-06 LAB — CBC
HCT: 46.4 % (ref 39.0–52.0)
HEMOGLOBIN: 15.7 g/dL (ref 13.0–17.0)
MCH: 24.5 pg — ABNORMAL LOW (ref 26.0–34.0)
MCHC: 33.8 g/dL (ref 30.0–36.0)
MCV: 72.4 fL — ABNORMAL LOW (ref 78.0–100.0)
Platelets: 292 10*3/uL (ref 150–400)
RBC: 6.41 MIL/uL — ABNORMAL HIGH (ref 4.22–5.81)
RDW: 15.5 % (ref 11.5–15.5)
WBC: 14.1 10*3/uL — AB (ref 4.0–10.5)

## 2017-08-06 LAB — BASIC METABOLIC PANEL
ANION GAP: 12 (ref 5–15)
BUN: 35 mg/dL — AB (ref 6–20)
CALCIUM: 9.4 mg/dL (ref 8.9–10.3)
CO2: 25 mmol/L (ref 22–32)
Chloride: 96 mmol/L — ABNORMAL LOW (ref 101–111)
Creatinine, Ser: 2.48 mg/dL — ABNORMAL HIGH (ref 0.61–1.24)
GFR calc Af Amer: 34 mL/min — ABNORMAL LOW (ref >60)
GFR, EST NON AFRICAN AMERICAN: 29 mL/min — AB (ref >60)
GLUCOSE: 472 mg/dL — AB (ref 65–99)
Potassium: 3.8 mmol/L (ref 3.5–5.1)
SODIUM: 133 mmol/L — AB (ref 135–145)

## 2017-08-06 LAB — CBG MONITORING, ED: GLUCOSE-CAPILLARY: 480 mg/dL — AB (ref 65–99)

## 2017-08-06 MED ORDER — SODIUM CHLORIDE 0.9 % IV BOLUS
1000.0000 mL | Freq: Once | INTRAVENOUS | Status: AC
  Administered 2017-08-06: 1000 mL via INTRAVENOUS

## 2017-08-06 NOTE — ED Provider Notes (Signed)
[facility] DEPT Provider Note   CSN: [id number] Arrival date & time: 08/06/17  1759     History   Chief Complaint Chief Complaint  Patient presents with  . Hyperglycemia    HPI Parker West is a 47 y.o. male.  HPI 47 year old African-American male past medical history significant for hypertension, hyperlipidemia, diabetes, ckd, obesity presents to the emergency department today for evaluation of increased thirst, hyperglycemia and generalized abdominal pain.  Patient states that his blood sugars have been elevated for the past 3 days.  States that he has been using his insulin at home as prescribed.  Patient states that prior to 3 days ago he was not taking his blood sugars regularly.  However he reports that he had increased thirst was concerned that his blood sugars were elevated so he started taking them.  The patient reports some nausea but denies any emesis.  Reports generalized abdominal pain and right flank pain.  Denies any urinary symptoms.  Patient states the pain is intermittent and does not interfere with his activities of daily living.  Patient states that he saw his endocrinologist and primary care doctor 3 weeks ago.  States that he was told that he did not need to go to endocrinology anymore as his A1c was 7.  Patient states that his blood sugars have been running in the 300s and 400s.  Patient reports drinking Gatorade often to stay hydrated.  Denies any change in his bowel habits including diarrhea, melena or hematochezia.  Patient denies any associated fevers or chills.  Denies any chest pain or shortness of breath.  He is taking his NovoLog today.  Pt denies any fever, chill, ha, vision changes, lightheadedness, dizziness, congestion, neck pain, cp, sob, cough, urinary symptoms, change in bowel habits, melena, hematochezia, lower extremity paresthesias.  Past Medical History:  Diagnosis Date  . Anginal pain ([place])   . Diabetes mellitus  without complication ([place])   . Dyspnea 10/09/2011   At rest  Recent DVT   . Heart murmur    "when I was small"  . Hematochezia 10/09/2011   Low grade antedated DVT  . Hyperlipidemia   . Hypertension   . Microcytic normochromic anemia 10/09/2011   retic 1.6% Hb 9.9 10/09/11  . Obesity   . Pneumonia 1988   "walking"  . Staph infection 09/04/11   BLE/wife's report    Patient Active Problem List   Diagnosis Date Noted  . Diabetic hyperosmolar non-ketotic state ([place]) 10/30/2014  . Hyperglycemia 10/30/2014  . AKI (acute kidney injury) ([place]) 10/30/2014  . Leukocytosis 10/30/2014  . Hyperlipidemia 10/30/2014  . New onset type 2 diabetes mellitus ([place])   . Morbid obesity ([place])   . Hematochezia 10/09/2011  . Microcytic normochromic anemia 10/09/2011  . Dyspnea 10/09/2011  . Edema 09/11/2011  . DVT (deep venous thrombosis) ([place]) 09/06/2011  . Cellulitis of multiple sites of lower extremity 09/05/2011  . HTN (hypertension) 09/05/2011  . Dermatosis 09/05/2011  . Essential hypertension 02/17/2009  . 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 EYE SURGERY     stye excision  . REDUCTION MAMMAPLASTY  1990's        Home Medications    Prior to Admission medications   Medication Sig Start Date End Date Taking? Authorizing Provider  allopurinol (ZYLOPRIM) 300 MG tablet Take 300 mg by mouth daily as needed (gout).    Yes [provider]  aspirin 81 MG chewable  tablet Chew 81 mg by mouth daily.   Yes [provider]  chlorthalidone (HYGROTEN) 100 MG tablet Take 100 mg by mouth daily.   Yes [provider]  clonazePAM (KLONOPIN) 0.5 MG tablet Take 0.5-1 mg by mouth 2 (two) times daily as needed for anxiety.    Yes [provider]  insulin NPH-regular Human (NOVOLIN 70/30) (70-30) 100 UNIT/ML injection Inject 40 Units into the skin 2 (two) times daily with a meal.    Yes [provider]  losartan  (COZAAR) 100 MG tablet Take 100 mg by mouth daily.   Yes [provider]  metFORMIN (GLUCOPHAGE-XR) 500 MG 24 hr tablet Take 500 mg by mouth 2 (two) times daily.   Yes [provider]  metoprolol succinate (TOPROL-XL) 50 MG 24 hr tablet Take 25 mg by mouth daily. Take with or immediately following a meal.   Yes [provider]  predniSONE (DELTASONE) 20 MG tablet Take 20 mg by mouth 2 (two) times daily as needed (uses when gout flares up).    Yes [provider]  simvastatin (ZOCOR) 40 MG tablet Take 40 mg by mouth daily.   Yes [provider]  traZODone (DESYREL) 100 MG tablet Take 400 mg by mouth at bedtime as needed for sleep.    Yes [provider]  blood glucose meter kit and supplies KIT Dispense based on patient and insurance preference. Use up to four times daily as directed. (FOR ICD-9 250.00, 250.01). 10/31/14   Annita Brod, MD  indomethacin (INDOCIN) 50 MG capsule Take 1 capsule (50 mg total) by mouth 3 (three) times daily as needed (gout). Patient not taking: Reported on 09/01/2016 08/17/15   Sam, Olivia Canter, PA-C  insulin aspart (NOVOLOG) 100 UNIT/ML injection Inject 0-20 Units into the skin 3 (three) times daily with meals. Patient not taking: Reported on 08/29/2015 10/31/14   Annita Brod, MD  insulin aspart (NOVOLOG) 100 UNIT/ML injection Inject 0-5 Units into the skin at bedtime. Patient not taking: Reported on 08/06/2017 10/31/14   Annita Brod, MD  insulin glargine (LANTUS) 100 UNIT/ML injection Inject 0.3 mLs (30 Units total) into the skin daily. Patient not taking: Reported on 08/29/2015 10/31/14   Annita Brod, MD  insulin lispro (HUMALOG) 100 UNIT/ML cartridge Inject 0.1 mLs (10 Units total) into the skin 3 (three) times daily with meals. Patient not taking: Reported on 08/29/2015 10/31/14   Annita Brod, MD  insulin starter kit- syringes MISC 1 kit by Other route once. 10/31/14   Annita Brod, MD     Family History Family History  Problem Relation Age of Onset  . Stroke Mother   . Hypertension Mother   . Diabetes Mother   . Breast cancer Mother   . Hypertension Father   . Ulcers Father        peptic  . Colon cancer Maternal Aunt     Social History Social History   Tobacco Use  . Smoking status: Never Smoker  . Smokeless tobacco: Never Used  Substance Use Topics  . Alcohol use: Yes    Comment: socially  . Drug use: No     Allergies   Patient has no known allergies.   Review of Systems Review of Systems  All other systems reviewed and are negative.    Physical Exam Updated Vital Signs BP 118/78 (BP Location: Left Arm)   Pulse (!) 123   Temp 97.7 F (36.5 C) (Oral)   Resp 18  SpO2 99%   Physical Exam  Constitutional: He is oriented to person, place, and time. He appears well-developed and well-nourished.  Non-toxic appearance. No distress.  HENT:  Head: Normocephalic and atraumatic.  Mouth/Throat: Oropharynx is clear and moist.  Eyes: Pupils are equal, round, and reactive to light. Conjunctivae are normal. Right eye exhibits no discharge. Left eye exhibits no discharge. No scleral icterus.  Neck: Normal range of motion. Neck supple.  Cardiovascular: Regular rhythm, normal heart sounds and intact distal pulses. Exam reveals no gallop and no friction rub.  No murmur heard. Tachycardia  Pulmonary/Chest: Effort normal and breath sounds normal. No stridor. No respiratory distress. He has no wheezes. He has no rales. He exhibits no tenderness.  Abdominal: Soft. Normal appearance and bowel sounds are normal. He exhibits no distension. There is no tenderness. There is no rigidity, no rebound, no guarding, no CVA tenderness, no tenderness at McBurney's point and negative Murphy's sign.  Obese abdomen.  Musculoskeletal: Normal range of motion. He exhibits no tenderness.  Lymphadenopathy:    He has no cervical adenopathy.  Neurological: He is alert and  oriented to person, place, and time.  Skin: Skin is warm and dry. Capillary refill takes less than 2 seconds. No rash noted. No pallor.  Psychiatric: His behavior is normal. Judgment and thought content normal.  Nursing note and vitals reviewed.    ED Treatments / Results  Labs (all labs ordered are listed, but only abnormal results are displayed) Labs Reviewed  BASIC METABOLIC PANEL - Abnormal; Notable for the following components:      Result Value   Sodium 133 (*)    Chloride 96 (*)    Glucose, Bld 472 (*)    BUN 35 (*)    Creatinine, Ser 2.48 (*)    GFR calc non Af Amer 29 (*)    GFR calc Af Amer 34 (*)    All other components within normal limits  CBC - Abnormal; Notable for the following components:   WBC 14.1 (*)    RBC 6.41 (*)    MCV 72.4 (*)    MCH 24.5 (*)    All other components within normal limits  URINALYSIS, ROUTINE W REFLEX MICROSCOPIC - Abnormal; Notable for the following components:   Color, Urine STRAW (*)    Glucose, UA >=500 (*)    Hgb urine dipstick SMALL (*)    Protein, ur 30 (*)    All other components within normal limits  CBG MONITORING, ED - Abnormal; Notable for the following components:   Glucose-Capillary 480 (*)    All other components within normal limits  CBG MONITORING, ED    EKG None  Radiology Ct Renal Stone Study  Result Date: 08/06/2017 CLINICAL DATA:  Acute onset of hyperglycemia. Nausea. EXAM: CT ABDOMEN AND PELVIS WITHOUT CONTRAST TECHNIQUE: Multidetector CT imaging of the abdomen and pelvis was performed following the standard protocol without IV contrast. COMPARISON:  Abdominal radiograph performed 05/25/2011 FINDINGS: Lower chest: The visualized lung bases are grossly clear. The visualized portions of the mediastinum are unremarkable. Hepatobiliary: Diffuse fatty infiltration is noted within the liver. The liver is otherwise unremarkable. The gallbladder is within normal limits. The common bile duct remains normal in caliber.  Pancreas: The pancreas is within normal limits. Spleen: The spleen is unremarkable in appearance. Adrenals/Urinary Tract: The adrenal glands are unremarkable in appearance. The kidneys are within normal limits. There is no evidence of hydronephrosis. No renal or ureteral stones are identified. No perinephric stranding is seen.  Stomach/Bowel: The stomach is unremarkable in appearance. The small bowel is within normal limits. The appendix is normal in caliber, without evidence of appendicitis. The colon is unremarkable in appearance. Vascular/Lymphatic: The abdominal aorta is unremarkable in appearance. The inferior vena cava is grossly unremarkable. No retroperitoneal lymphadenopathy is seen. No pelvic sidewall lymphadenopathy is identified. Reproductive: The bladder is mildly distended and grossly unremarkable in appearance. The prostate remains normal in size. Other: No additional soft tissue abnormalities are seen. Musculoskeletal: No acute osseous abnormalities are identified. The visualized musculature is unremarkable in appearance. IMPRESSION: 1. No acute abnormality seen within the abdomen or pelvis. 2. Diffuse fatty infiltration within the liver. Electronically Signed   By: Garald Balding M.D.   On: 08/06/2017 23:35    Procedures Procedures (including critical care time)  Medications Ordered in ED Medications  sodium chloride 0.9 % bolus 1,000 mL (0 mLs Intravenous Stopped 08/07/17 0145)  sodium chloride 0.9 % bolus 1,000 mL (0 mLs Intravenous Stopped 08/07/17 0147)     Initial Impression / Assessment and Plan / ED Course  I have reviewed the triage vital signs and the nursing notes.  Pertinent labs & imaging results that were available during my care of the patient were reviewed by me and considered in my medical decision making (see chart for details).     Patient presents to the ED for evaluation of hyperglycemia, polyuria, polydipsia, nausea and generalized abdominal pain.  Patient is  overall well-appearing and nontoxic.  Vital signs are been reassuring.  No focal abdominal tenderness to palpation.  Bowel sounds present in all 4 quadrants.  Heart regular rate and rhythm.  Lungs clear to auscultation bilaterally.  No CVA tenderness.  No significant signs of dehydration.  Lab work reassuring.  Mild leukocytosis with history of same.  Hemoglobin stable.  No significant electrolyte derangement.  Anion gap is normal.  Mild elevation in creatinine 2.4 with baseline up to 2 years ago.  Patient has history of CKD.  This is likely baseline for patient versus mild dehydration.  Glucose is 472.  UA with glucose but no signs of ketones or infection.  pH is normal.  No signs of DKA.  CT scan was obtained given generalized belly pain to assess for any infection or because of hyperglycemia.  CT scan was reassuring without any acute findings except for fatty infiltrate of liver.  She given 2 L of fluid in the ED.  Tachycardia improved.  Blood sugars improved to 351.  Patient has had no vomiting.  Patient stable for discharge with outpatient follow-up and continue insulin at home.  No sign of DKA in the ED.  Patient needs a follow-up with endocrinologist have given her referral.  Pt is hemodynamically stable, in NAD, & able to ambulate in the ED. Evaluation does not show pathology that would require ongoing emergent intervention or inpatient treatment. I explained the diagnosis to the patient. Pain has been managed & has no complaints prior to dc. Pt is comfortable with above plan and is stable for discharge at this time. All questions were answered prior to disposition. Strict return precautions for f/u to the ED were discussed. Encouraged follow up with PCP.   Final Clinical Impressions(s) / ED Diagnoses   Final diagnoses:  Hyperglycemia    ED Discharge Orders    None       Aaron Edelman 08/07/17 0215    Fatima Blank, MD 08/07/17 [phone]

## 2017-08-06 NOTE — ED Triage Notes (Signed)
Patient here from home with complaints of hyperglycemia. Novolog 3 hours ago. States that he cannot get control of blood sugar. Nausea, no vomiting.

## 2017-08-07 DIAGNOSIS — E1165 Type 2 diabetes mellitus with hyperglycemia: Secondary | ICD-10-CM | POA: Diagnosis not present

## 2017-08-07 LAB — BLOOD GAS, VENOUS
Acid-base deficit: 12.4 mmol/L — ABNORMAL HIGH (ref 0.0–2.0)
Bicarbonate: 12.3 mmol/L — ABNORMAL LOW (ref 20.0–28.0)
FIO2: 21
O2 SAT: 70.2 %
PATIENT TEMPERATURE: 98.6
PCO2 VEN: 24.6 mmHg — AB (ref 44.0–60.0)
PO2 VEN: 41 mmHg (ref 32.0–45.0)
pH, Ven: 7.321 (ref 7.250–7.430)

## 2017-08-07 LAB — CBG MONITORING, ED
GLUCOSE-CAPILLARY: 351 mg/dL — AB (ref 65–99)
GLUCOSE-CAPILLARY: 406 mg/dL — AB (ref 65–99)

## 2017-08-07 NOTE — Discharge Instructions (Addendum)
Your blood work has been reassuring in the emergency department.  I recommend continue your insulin at home.  Drink plenty of fluids stay hydrated.  Follow-up with a primary care doctor and endocrinologist.

## 2017-08-29 ENCOUNTER — Encounter (HOSPITAL_COMMUNITY): Payer: Self-pay | Admitting: *Deleted

## 2017-08-29 ENCOUNTER — Inpatient Hospital Stay (HOSPITAL_COMMUNITY)
Admission: EM | Admit: 2017-08-29 | Discharge: 2017-09-01 | DRG: 603 | Disposition: A | Discharge status: Home / Self Care | Payer: 59 | Attending: Internal Medicine | Admitting: Internal Medicine

## 2017-08-29 DIAGNOSIS — Z6841 Body Mass Index (BMI) 40.0 and over, adult: Secondary | ICD-10-CM | POA: Diagnosis not present

## 2017-08-29 DIAGNOSIS — E785 Hyperlipidemia, unspecified: Secondary | ICD-10-CM | POA: Diagnosis present

## 2017-08-29 DIAGNOSIS — N1831 Chronic kidney disease, stage 3a: Secondary | ICD-10-CM | POA: Diagnosis present

## 2017-08-29 DIAGNOSIS — I129 Hypertensive chronic kidney disease with stage 1 through stage 4 chronic kidney disease, or unspecified chronic kidney disease: Secondary | ICD-10-CM | POA: Diagnosis present

## 2017-08-29 DIAGNOSIS — R739 Hyperglycemia, unspecified: Secondary | ICD-10-CM

## 2017-08-29 DIAGNOSIS — N179 Acute kidney failure, unspecified: Secondary | ICD-10-CM | POA: Diagnosis present

## 2017-08-29 DIAGNOSIS — E1165 Type 2 diabetes mellitus with hyperglycemia: Secondary | ICD-10-CM | POA: Diagnosis present

## 2017-08-29 DIAGNOSIS — Z794 Long term (current) use of insulin: Secondary | ICD-10-CM

## 2017-08-29 DIAGNOSIS — M109 Gout, unspecified: Secondary | ICD-10-CM | POA: Diagnosis present

## 2017-08-29 DIAGNOSIS — E119 Type 2 diabetes mellitus without complications: Secondary | ICD-10-CM

## 2017-08-29 DIAGNOSIS — I959 Hypotension, unspecified: Secondary | ICD-10-CM | POA: Diagnosis not present

## 2017-08-29 DIAGNOSIS — D72829 Elevated white blood cell count, unspecified: Secondary | ICD-10-CM | POA: Diagnosis present

## 2017-08-29 DIAGNOSIS — Z833 Family history of diabetes mellitus: Secondary | ICD-10-CM

## 2017-08-29 DIAGNOSIS — D509 Iron deficiency anemia, unspecified: Secondary | ICD-10-CM | POA: Diagnosis present

## 2017-08-29 DIAGNOSIS — E860 Dehydration: Secondary | ICD-10-CM | POA: Diagnosis present

## 2017-08-29 DIAGNOSIS — L02411 Cutaneous abscess of right axilla: Secondary | ICD-10-CM | POA: Diagnosis not present

## 2017-08-29 DIAGNOSIS — E1122 Type 2 diabetes mellitus with diabetic chronic kidney disease: Secondary | ICD-10-CM | POA: Diagnosis present

## 2017-08-29 DIAGNOSIS — I10 Essential (primary) hypertension: Secondary | ICD-10-CM | POA: Diagnosis present

## 2017-08-29 DIAGNOSIS — Z79899 Other long term (current) drug therapy: Secondary | ICD-10-CM

## 2017-08-29 DIAGNOSIS — N183 Chronic kidney disease, stage 3 (moderate): Secondary | ICD-10-CM | POA: Diagnosis present

## 2017-08-29 DIAGNOSIS — Z86718 Personal history of other venous thrombosis and embolism: Secondary | ICD-10-CM

## 2017-08-29 DIAGNOSIS — Z8249 Family history of ischemic heart disease and other diseases of the circulatory system: Secondary | ICD-10-CM

## 2017-08-29 DIAGNOSIS — Z7982 Long term (current) use of aspirin: Secondary | ICD-10-CM

## 2017-08-29 LAB — CBG MONITORING, ED: Glucose-Capillary: 486 mg/dL — ABNORMAL HIGH (ref 70–99)

## 2017-08-29 NOTE — ED Triage Notes (Signed)
Pt is here from home for boil in right axilla x2-3 weeks.  Pt reports that they have been draining but continue to cause pain.  Pt is diabetic and has been taking his medications as prescribed.  Pt reports that his CBG has been 400-500 today.  Pt last checked his CBG midmorning and is was 530.  Pt is alert and oriented, denies any fevers, chills or sob.

## 2017-08-30 ENCOUNTER — Other Ambulatory Visit: Payer: Self-pay

## 2017-08-30 DIAGNOSIS — N183 Chronic kidney disease, stage 3 unspecified: Secondary | ICD-10-CM | POA: Diagnosis present

## 2017-08-30 DIAGNOSIS — L02411 Cutaneous abscess of right axilla: Secondary | ICD-10-CM | POA: Diagnosis present

## 2017-08-30 DIAGNOSIS — E1165 Type 2 diabetes mellitus with hyperglycemia: Secondary | ICD-10-CM | POA: Diagnosis not present

## 2017-08-30 DIAGNOSIS — Z794 Long term (current) use of insulin: Secondary | ICD-10-CM | POA: Diagnosis not present

## 2017-08-30 DIAGNOSIS — I10 Essential (primary) hypertension: Secondary | ICD-10-CM | POA: Diagnosis not present

## 2017-08-30 DIAGNOSIS — N1831 Chronic kidney disease, stage 3a: Secondary | ICD-10-CM | POA: Diagnosis present

## 2017-08-30 DIAGNOSIS — E1122 Type 2 diabetes mellitus with diabetic chronic kidney disease: Secondary | ICD-10-CM | POA: Diagnosis not present

## 2017-08-30 LAB — GLUCOSE, CAPILLARY
GLUCOSE-CAPILLARY: 146 mg/dL — AB (ref 70–99)
GLUCOSE-CAPILLARY: 189 mg/dL — AB (ref 70–99)
Glucose-Capillary: 329 mg/dL — ABNORMAL HIGH (ref 70–99)
Glucose-Capillary: 211 mg/dL — ABNORMAL HIGH (ref 70–99)

## 2017-08-30 LAB — BASIC METABOLIC PANEL
Anion gap: 8 (ref 5–15)
Anion gap: 9 (ref 5–15)
BUN: 23 mg/dL — AB (ref 6–20)
BUN: 27 mg/dL — AB (ref 6–20)
CALCIUM: 8.5 mg/dL — AB (ref 8.9–10.3)
CALCIUM: 9 mg/dL (ref 8.9–10.3)
CO2: 21 mmol/L — ABNORMAL LOW (ref 22–32)
CO2: 23 mmol/L (ref 22–32)
CREATININE: 2.1 mg/dL — AB (ref 0.61–1.24)
Chloride: 101 mmol/L (ref 98–111)
Chloride: 103 mmol/L (ref 98–111)
Creatinine, Ser: 2.01 mg/dL — ABNORMAL HIGH (ref 0.61–1.24)
GFR calc Af Amer: 42 mL/min — ABNORMAL LOW (ref >60)
GFR calc Af Amer: 44 mL/min — ABNORMAL LOW (ref >60)
GFR calc non Af Amer: 36 mL/min — ABNORMAL LOW (ref >60)
GFR, EST NON AFRICAN AMERICAN: 38 mL/min — AB (ref >60)
GLUCOSE: 439 mg/dL — AB (ref 70–99)
Glucose, Bld: 514 mg/dL (ref 70–99)
Potassium: 3.7 mmol/L (ref 3.5–5.1)
Potassium: 4 mmol/L (ref 3.5–5.1)
SODIUM: 132 mmol/L — AB (ref 135–145)
Sodium: 133 mmol/L — ABNORMAL LOW (ref 135–145)

## 2017-08-30 LAB — URINALYSIS, ROUTINE W REFLEX MICROSCOPIC
BACTERIA UA: NONE SEEN
BILIRUBIN URINE: NEGATIVE
Glucose, UA: >=500 mg/dL — AB
Hgb urine dipstick: NEGATIVE
Ketones, ur: NEGATIVE mg/dL
Leukocytes, UA: NEGATIVE
Nitrite: NEGATIVE
PH: 5 (ref 5.0–8.0)
Protein, ur: NEGATIVE mg/dL
Specific Gravity, Urine: 1.013 (ref 1.005–1.030)

## 2017-08-30 LAB — CBC
HCT: 38.4 % — ABNORMAL LOW (ref 39.0–52.0)
HCT: 40.5 % (ref 39.0–52.0)
Hemoglobin: 12.9 g/dL — ABNORMAL LOW (ref 13.0–17.0)
Hemoglobin: 13.6 g/dL (ref 13.0–17.0)
MCH: 24.1 pg — AB (ref 26.0–34.0)
MCH: 24 pg — AB (ref 26.0–34.0)
MCHC: 33.6 g/dL (ref 30.0–36.0)
MCHC: 33.6 g/dL (ref 30.0–36.0)
MCV: 71.8 fL — AB (ref 78.0–100.0)
MCV: 71.4 fL — AB (ref 78.0–100.0)
PLATELETS: 263 10*3/uL (ref 150–400)
PLATELETS: 342 10*3/uL (ref 150–400)
RBC: 5.35 MIL/uL (ref 4.22–5.81)
RBC: 5.67 MIL/uL (ref 4.22–5.81)
RDW: 15.9 % — AB (ref 11.5–15.5)
RDW: 15.9 % — AB (ref 11.5–15.5)
WBC: 10.8 10*3/uL — AB (ref 4.0–10.5)
WBC: 14.2 10*3/uL — ABNORMAL HIGH (ref 4.0–10.5)

## 2017-08-30 LAB — GRAM STAIN

## 2017-08-30 LAB — MRSA PCR SCREENING: MRSA by PCR: NEGATIVE

## 2017-08-30 LAB — HIV ANTIBODY (ROUTINE TESTING W REFLEX): HIV Screen 4th Generation wRfx: NONREACTIVE

## 2017-08-30 LAB — CBG MONITORING, ED: Glucose-Capillary: 459 mg/dL — ABNORMAL HIGH (ref 70–99)

## 2017-08-30 MED ORDER — INSULIN ASPART 100 UNIT/ML ~~LOC~~ SOLN
5.0000 [IU] | Freq: Once | SUBCUTANEOUS | Status: AC
  Administered 2017-08-30: 5 [IU] via INTRAVENOUS
  Filled 2017-08-30: qty 1

## 2017-08-30 MED ORDER — PIPERACILLIN-TAZOBACTAM 3.375 G IVPB 30 MIN
3.3750 g | Freq: Once | INTRAVENOUS | Status: AC
  Administered 2017-08-30: 3.375 g via INTRAVENOUS
  Filled 2017-08-30: qty 50

## 2017-08-30 MED ORDER — DOXYCYCLINE HYCLATE 100 MG IV SOLR: 100.0000 mg | Freq: Two times a day (BID) | INTRAVENOUS | Status: DC

## 2017-08-30 MED ORDER — DOXYCYCLINE HYCLATE 100 MG PO TABS: 100.0000 mg | ORAL_TABLET | Freq: Two times a day (BID) | ORAL | Status: DC

## 2017-08-30 MED ORDER — SIMVASTATIN 40 MG PO TABS
40.0000 mg | ORAL_TABLET | Freq: Every day | ORAL | Status: DC
  Administered 2017-08-30 – 2017-09-01 (×3): 40 mg via ORAL
  Filled 2017-08-30 (×2): qty 2
  Filled 2017-08-30 (×3): qty 1
  Filled 2017-08-30: qty 2

## 2017-08-30 MED ORDER — METOPROLOL SUCCINATE ER 25 MG PO TB24
25.0000 mg | ORAL_TABLET | Freq: Every day | ORAL | Status: DC
  Administered 2017-08-30 – 2017-09-01 (×3): 25 mg via ORAL
  Filled 2017-08-30 (×3): qty 1

## 2017-08-30 MED ORDER — SODIUM CHLORIDE 0.9 % IV BOLUS
1000.0000 mL | Freq: Once | INTRAVENOUS | Status: AC
  Administered 2017-08-30: 1000 mL via INTRAVENOUS

## 2017-08-30 MED ORDER — ONDANSETRON HCL 4 MG PO TABS: 4.0000 mg | ORAL_TABLET | Freq: Four times a day (QID) | ORAL | Status: DC | PRN

## 2017-08-30 MED ORDER — LIDOCAINE HCL (PF) 1 % IJ SOLN
30.0000 mL | Freq: Once | INTRAMUSCULAR | Status: AC
  Administered 2017-08-30: 30 mL

## 2017-08-30 MED ORDER — ENOXAPARIN SODIUM 80 MG/0.8ML ~~LOC~~ SOLN
80.0000 mg | SUBCUTANEOUS | Status: DC
  Administered 2017-08-30 – 2017-08-31 (×2): 80 mg via SUBCUTANEOUS
  Filled 2017-08-30 (×3): qty 0.8

## 2017-08-30 MED ORDER — ONDANSETRON HCL 4 MG/2ML IJ SOLN: 4.0000 mg | Freq: Four times a day (QID) | INTRAMUSCULAR | Status: DC | PRN

## 2017-08-30 MED ORDER — INSULIN GLARGINE 100 UNIT/ML ~~LOC~~ SOLN
40.0000 [IU] | Freq: Every day | SUBCUTANEOUS | Status: DC
  Administered 2017-08-30: 40 [IU] via SUBCUTANEOUS
  Filled 2017-08-30: qty 0.4

## 2017-08-30 MED ORDER — INSULIN ASPART PROT & ASPART (70-30 MIX) 100 UNIT/ML ~~LOC~~ SUSP
25.0000 [IU] | Freq: Two times a day (BID) | SUBCUTANEOUS | Status: DC
  Administered 2017-08-30 – 2017-09-01 (×5): 25 [IU] via SUBCUTANEOUS
  Filled 2017-08-30: qty 10

## 2017-08-30 MED ORDER — CHLORTHALIDONE 50 MG PO TABS: 100.0000 mg | ORAL_TABLET | Freq: Every day | ORAL | Status: DC

## 2017-08-30 MED ORDER — VANCOMYCIN HCL IN DEXTROSE 1-5 GM/200ML-% IV SOLN
1000.0000 mg | Freq: Once | INTRAVENOUS | Status: AC
  Administered 2017-08-30: 1000 mg via INTRAVENOUS
  Filled 2017-08-30: qty 200

## 2017-08-30 MED ORDER — INSULIN ASPART 100 UNIT/ML ~~LOC~~ SOLN
0.0000 [IU] | Freq: Three times a day (TID) | SUBCUTANEOUS | Status: DC
  Administered 2017-08-30: 7 [IU] via SUBCUTANEOUS
  Administered 2017-08-30: 15 [IU] via SUBCUTANEOUS
  Administered 2017-08-30: 3 [IU] via SUBCUTANEOUS
  Administered 2017-08-31: 7 [IU] via SUBCUTANEOUS
  Administered 2017-08-31: 11 [IU] via SUBCUTANEOUS
  Administered 2017-08-31 – 2017-09-01 (×2): 4 [IU] via SUBCUTANEOUS
  Administered 2017-09-01: 11 [IU] via SUBCUTANEOUS

## 2017-08-30 MED ORDER — ACETAMINOPHEN 650 MG RE SUPP: 650.0000 mg | Freq: Four times a day (QID) | RECTAL | Status: DC | PRN

## 2017-08-30 MED ORDER — ACETAMINOPHEN 325 MG PO TABS
650.0000 mg | ORAL_TABLET | Freq: Four times a day (QID) | ORAL | Status: DC | PRN
  Administered 2017-08-31: 650 mg via ORAL
  Filled 2017-08-30: qty 2

## 2017-08-30 MED ORDER — ASPIRIN 81 MG PO CHEW
81.0000 mg | CHEWABLE_TABLET | Freq: Every day | ORAL | Status: DC
  Administered 2017-08-30 – 2017-09-01 (×3): 81 mg via ORAL
  Filled 2017-08-30 (×3): qty 1

## 2017-08-30 MED ORDER — CEPHALEXIN 500 MG PO CAPS
500.0000 mg | ORAL_CAPSULE | Freq: Four times a day (QID) | ORAL | Status: DC
  Administered 2017-08-30 – 2017-08-31 (×3): 500 mg via ORAL
  Filled 2017-08-30 (×3): qty 1

## 2017-08-30 MED ORDER — INSULIN ASPART 100 UNIT/ML ~~LOC~~ SOLN
10.0000 [IU] | Freq: Once | SUBCUTANEOUS | Status: AC
  Administered 2017-08-30: 10 [IU] via SUBCUTANEOUS
  Filled 2017-08-30: qty 1

## 2017-08-30 MED ORDER — LOSARTAN POTASSIUM 50 MG PO TABS
100.0000 mg | ORAL_TABLET | Freq: Every day | ORAL | Status: DC
  Administered 2017-08-30 – 2017-09-01 (×3): 100 mg via ORAL
  Filled 2017-08-30 (×3): qty 2

## 2017-08-30 MED ORDER — CLONAZEPAM 0.5 MG PO TABS: 0.5000 mg | ORAL_TABLET | Freq: Two times a day (BID) | ORAL | Status: DC | PRN

## 2017-08-30 MED ORDER — TRAZODONE HCL 100 MG PO TABS
400.0000 mg | ORAL_TABLET | Freq: Every evening | ORAL | Status: DC | PRN
  Administered 2017-08-31: 400 mg via ORAL
  Filled 2017-08-30: qty 4

## 2017-08-30 NOTE — H&P (Signed)
History and Physical    Parker West:[id number] DOB: 03/30/1970 DOA: 08/29/2017  PCP: Rodney Langton, MD  Patient coming from: Home  I have personally briefly reviewed patient's old medical records in [place]  Chief Complaint: Recurrent skin infections  HPI: Parker West is a 47 y.o. male with medical history significant of DM2, very poorly controlled, CKD stage 3, HTN.  Patient presents to the ED with swelling, pain, drainage from R axilla.  Onset 3 weeks ago.  Persistent.  Hasnt had any treatment or ABx yet for this.   ED Course: I+D of axillary abscess done in ED, minimal drainage.  Packed.  Cultures sent.  Given doses of empiric zosyn and vanc.   Review of Systems: As per HPI otherwise 10 point review of systems negative.   Past Medical History:  Diagnosis Date  . Anginal pain ([place])   . Diabetes mellitus without complication ([place])   . Dyspnea 10/09/2011   At rest  Recent DVT   . Heart murmur    "when I was small"  . Hematochezia 10/09/2011   Low grade antedated DVT  . Hyperlipidemia   . Hypertension   . Microcytic normochromic anemia 10/09/2011   retic 1.6% Hb 9.9 10/09/11  . Obesity   . Pneumonia 1988   "walking"  . Staph infection 09/04/11   BLE/wife's report    Past Surgical History:  Procedure Laterality Date  . EYE SURGERY     stye excision  . REDUCTION MAMMAPLASTY  1990's     reports that he has never smoked. He has never used smokeless tobacco. He reports that he drinks alcohol. He reports that he does not use drugs.  No Known Allergies  Family History  Problem Relation Age of Onset  . Stroke Mother   . Hypertension Mother   . Diabetes Mother   . Breast cancer Mother   . Hypertension Father   . Ulcers Father        peptic  . Colon cancer Maternal Aunt      Prior to Admission medications   Medication Sig Start Date End Date Taking? Authorizing Provider  allopurinol (ZYLOPRIM) 300 MG tablet Take 300 mg by mouth daily as needed  (gout).     [provider]  aspirin 81 MG chewable tablet Chew 81 mg by mouth daily.    [provider]  blood glucose meter kit and supplies KIT Dispense based on patient and insurance preference. Use up to four times daily as directed. (FOR ICD-9 250.00, 250.01). 10/31/14   Annita Brod, MD  chlorthalidone (HYGROTEN) 100 MG tablet Take 100 mg by mouth daily.    [provider]  clonazePAM (KLONOPIN) 0.5 MG tablet Take 0.5-1 mg by mouth 2 (two) times daily as needed for anxiety.     [provider]  insulin NPH-regular Human (NOVOLIN 70/30) (70-30) 100 UNIT/ML injection Inject 40 Units into the skin 2 (two) times daily with a meal.     [provider]  insulin starter kit- syringes MISC 1 kit by Other route once. 10/31/14   Annita Brod, MD  losartan (COZAAR) 100 MG tablet Take 100 mg by mouth daily.    [provider]  metoprolol succinate (TOPROL-XL) 50 MG 24 hr tablet Take 25 mg by mouth daily. Take with or immediately following a meal.    [provider]  predniSONE (DELTASONE) 20 MG tablet Take 20 mg by mouth 2 (two) times daily as needed (uses when  gout flares up).     [provider]  simvastatin (ZOCOR) 40 MG tablet Take 40 mg by mouth daily.    [provider]  traZODone (DESYREL) 100 MG tablet Take 400 mg by mouth at bedtime as needed for sleep.     [provider]    Physical Exam: Vitals:   08/29/17 2313 08/29/17 2326 08/30/17 0150  BP: 121/90  (!) 153/105  Pulse: (!) 108  96  Resp: 20  20  Temp: 98 F (36.7 C)  97.9 F (36.6 C)  TempSrc: Oral  Oral  SpO2: 99%  100%  Weight:  (!) 152.9 kg (337 lb)   Height:  6' (1.829 m)     Constitutional: NAD, calm, comfortable Eyes: PERRL, lids and conjunctivae normal ENMT: Mucous membranes are moist. Posterior pharynx clear of any exudate or lesions.Normal dentition.  Neck: normal, supple, no masses, no thyromegaly Respiratory: clear  to auscultation bilaterally, no wheezing, no crackles. Normal respiratory effort. No accessory muscle use.  Cardiovascular: Regular rate and rhythm, no murmurs / rubs / gallops. No extremity edema. 2+ pedal pulses. No carotid bruits.  Abdomen: no tenderness, no masses palpated. No hepatosplenomegaly. Bowel sounds positive.  Musculoskeletal: no clubbing / cyanosis. No joint deformity upper and lower extremities. Good ROM, no contractures. Normal muscle tone.  Skin:    Neurologic: CN 2-12 grossly intact. Sensation intact, DTR normal. Strength 5/5 in all 4.  Psychiatric: Normal judgment and insight. Alert and oriented x 3. Normal mood.    Labs on Admission: I have personally reviewed following labs and imaging studies  CBC: Recent Labs  Lab 08/29/17 2345  WBC 14.2*  HGB 13.6  HCT 40.5  MCV 71.4*  PLT 269   Basic Metabolic Panel: Recent Labs  Lab 08/29/17 2345  NA 133*  K 4.0  CL 101  CO2 23  GLUCOSE 514*  BUN 27*  CREATININE 2.10*  CALCIUM 9.0   GFR: Estimated Creatinine Clearance: 66.2 mL/min (A) (by C-G formula based on SCr of 2.1 mg/dL (H)). Liver Function Tests: No results for input(s): AST, ALT, ALKPHOS, BILITOT, PROT, ALBUMIN in the last 168 hours. No results for input(s): LIPASE, AMYLASE in the last 168 hours. No results for input(s): AMMONIA in the last 168 hours. Coagulation Profile: No results for input(s): INR, PROTIME in the last 168 hours. Cardiac Enzymes: No results for input(s): CKTOTAL, CKMB, CKMBINDEX, TROPONINI in the last 168 hours. BNP (last 3 results) No results for input(s): PROBNP in the last 8760 hours. HbA1C: No results for input(s): HGBA1C in the last 72 hours. CBG: Recent Labs  Lab 08/29/17 2343  GLUCAP 486*   Lipid Profile: No results for input(s): CHOL, HDL, LDLCALC, TRIG, CHOLHDL, LDLDIRECT in the last 72 hours. Thyroid Function Tests: No results for input(s): TSH, T4TOTAL, FREET4, T3FREE, THYROIDAB in the last 72 hours. Anemia  Panel: No results for input(s): VITAMINB12, FOLATE, FERRITIN, TIBC, IRON, RETICCTPCT in the last 72 hours. Urine analysis:    Component Value Date/Time   COLORURINE STRAW (A) 08/29/2017 2329   APPEARANCEUR CLEAR 08/29/2017 2329   LABSPEC 1.013 08/29/2017 2329   LABSPEC 1.010 10/10/2011 1319   PHURINE 5.0 08/29/2017 2329   GLUCOSEU >=500 (A) 08/29/2017 2329   HGBUR NEGATIVE 08/29/2017 2329   BILIRUBINUR NEGATIVE 08/29/2017 2329   BILIRUBINUR Negative 10/10/2011 1319   KETONESUR NEGATIVE 08/29/2017 2329   PROTEINUR NEGATIVE 08/29/2017 2329   UROBILINOGEN 0.2 10/30/2014 0033   NITRITE NEGATIVE 08/29/2017 2329   LEUKOCYTESUR NEGATIVE 08/29/2017 2329  LEUKOCYTESUR Negative 10/10/2011 1319    Radiological Exams on Admission: No results found.  EKG: Independently reviewed.  Assessment/Plan Principal Problem:   Abscess of axilla, right Active Problems:   Essential hypertension   DM2 (diabetes mellitus, type 2) (HCC)   CKD stage 3 secondary to diabetes ([place])    1. R axilla abscess - 1. GS and cultures ordered and pending 2. Will de-escalate ABx to doxycycline as per cellulitis pathway for the moment, revise based on GS results later this morning. 3. Needs BGL control! 2. DM2 - will try D[place] insulin first to see if we can spare the need for gluco stabilizer and SDU admit (runs 400s-500s at baseline it looks like) 1. Lantus 40u Qday 2. Resistant SSI AC 3. Got 5u novolog in ED, BGL 431 after, will get another 10u novolog now. 4. Stop Metformin due to CKD 3. HTN - 1. Will leave him on losartan 1. Doesn't take chlorthalidone regularly he says 2. Keep an eye on creatinine during stay 4. CKD stage 3  1. Chronic and baseline 2. Stop metformin  DVT prophylaxis: Lovenox Code Status: Full Family Communication: No family in room Disposition Plan: Home after admit Consults called: None Admission status: Place in obs   GARDNER, [facility] Pager [phone]  Only works nights!  If 7AM-7PM, please contact the primary day team physician taking care of patient  [email or web address] Password [facility]  08/30/2017, 3:14 AM

## 2017-08-30 NOTE — ED Provider Notes (Signed)
INCISION AND DRAINAGE Performed by: Arnoldo HookerShari A Jakolby Sedivy Consent: Verbal consent obtained. Risks and benefits: risks, benefits and alternatives were discussed Type: abscess  Body area: right axilla  Anesthesia: local infiltration  Incision was made with a scalpel, #11  Local anesthetic: lidocaine 1% w/o epinephrine  Anesthetic total: 3 ml  Complexity: complex Blunt dissection to break up loculations  Drainage: purulent  Drainage amount: small  Packing material: 1/4 in iodoform gauze  Patient tolerance: Patient tolerated the procedure well with no immediate complications.      Elpidio AnisUpstill, Maribel Luis, PA-C 08/30/17 [id number]    Devoria AlbeKnapp, Iva, MD 08/30/17 [id number]

## 2017-08-30 NOTE — ED Notes (Signed)
Date and time results received: 08/30/17 12:35 AM    Test: Glucose, serum Critical Value: 514mg /dl   Name of Provider Notified: Upstill, PA  V.O for 1l NS Bolus

## 2017-08-30 NOTE — Progress Notes (Signed)
Parker West is a 47 y.o. male with medical history significant of DM2, very poorly controlled, CKD stage 3, HTN.  Patient presents to the ED with swelling, pain, drainage from R axilla.  Onset 3 weeks ago.  Persistent.  Hasnt had any treatment or ABx yet for this.  08/30/2017: Patient seen and examined with family members at bedside.  Pain is well controlled on current pain management.  Gram stain positive for rare gram-positive cocci in pairs.  Started p.o. Keflex 500 mg 4 times daily.  Awaiting sensitivities.  Please refer to H&P dictated by Dr. Lanae BoastGarner on 08/30/2017 for further details of the assessment and plan.

## 2017-08-30 NOTE — ED Notes (Signed)
Admitting MD notified of last CBG 431 as it has not crossed over to the chart. New orders received and pharmacy notified for medications, await this

## 2017-08-30 NOTE — ED Provider Notes (Signed)
[facility] DEPT Provider Note   CSN: [id number] Arrival date & time: 08/29/17  2235  Time seen 01:10 AM  History   Chief Complaint Chief Complaint  Patient presents with  . Recurrent Skin Infections    HPI Parker West is a 47 y.o. male.  HPI she has a history of diabetes on both insulin and oral agents.  He reports his CBGs in the morning are usually in the 200s sometimes 300s.  However he states at the [facility] where he is followed by the endocrinologist they stated his A1c was 7.3 so they discharged him from their clinic.  He states he started having some pain and swelling in his right axilla about 3 weeks ago that is continuing.  He denies getting any treatment up until today for this problem.  He denies fever or vomiting but states he has had mild nausea.  He states he is never had abscesses or boils in the past.  He states he uses old spice deodorant.  We discussed switching to a deodorant that is unscented.  PCP VAH Rolly Salter, MD   Past Medical History:  Diagnosis Date  . Anginal pain ([place])   . Diabetes mellitus without complication ([place])   . Dyspnea 10/09/2011   At rest  Recent DVT   . Heart murmur    "when I was small"  . Hematochezia 10/09/2011   Low grade antedated DVT  . Hyperlipidemia   . Hypertension   . Microcytic normochromic anemia 10/09/2011   retic 1.6% Hb 9.9 10/09/11  . Obesity   . Pneumonia 1988   "walking"  . Staph infection 09/04/11   BLE/wife's report    Patient Active Problem List   Diagnosis Date Noted  . Abscess of axilla, right 08/30/2017  . CKD stage 3 secondary to diabetes ([place]) 08/30/2017  . Diabetic hyperosmolar non-ketotic state ([place]) 10/30/2014  . Hyperglycemia 10/30/2014  . AKI (acute kidney injury) ([place]) 10/30/2014  . Leukocytosis 10/30/2014  . Hyperlipidemia 10/30/2014  . DM2 (diabetes mellitus, type 2) ([place])   . Morbid obesity ([place])   . Hematochezia 10/09/2011  . Microcytic  normochromic anemia 10/09/2011  . Dyspnea 10/09/2011  . Edema 09/11/2011  . DVT (deep venous thrombosis) ([place]) 09/06/2011  . Cellulitis of multiple sites of lower extremity 09/05/2011  . HTN (hypertension) 09/05/2011  . Dermatosis 09/05/2011  . Essential hypertension 02/17/2009  . 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 EYE SURGERY     stye excision  . REDUCTION MAMMAPLASTY  1990's        Home Medications    Prior to Admission medications   Medication Sig Start Date End Date Taking? Authorizing Provider  allopurinol (ZYLOPRIM) 300 MG tablet Take 300 mg by mouth daily as needed (gout).    Yes [provider]  aspirin 81 MG chewable tablet Chew 81 mg by mouth daily.   Yes [provider]  chlorthalidone (HYGROTEN) 100 MG tablet Take 100 mg by mouth daily.   Yes [provider]  clonazePAM (KLONOPIN) 0.5 MG tablet Take 0.5-1 mg by mouth 2 (two) times daily as needed for anxiety.    Yes [provider]  insulin NPH-regular Human (NOVOLIN 70/30) (70-30) 100 UNIT/ML injection Inject 40 Units into the skin 2 (two) times daily with a meal.    Yes [provider]  losartan (COZAAR) 100 MG tablet Take 100 mg by mouth daily.  Yes [provider]  metoprolol succinate (TOPROL-XL) 50 MG 24 hr tablet Take 50 mg by mouth daily.    Yes [provider]  predniSONE (DELTASONE) 20 MG tablet Take 20 mg by mouth 2 (two) times daily as needed (uses when gout flares up).    Yes [provider]  simvastatin (ZOCOR) 40 MG tablet Take 40 mg by mouth daily.   Yes [provider]  traZODone (DESYREL) 100 MG tablet Take 400 mg by mouth at bedtime as needed for sleep.    Yes [provider]  blood glucose meter kit and supplies KIT Dispense based on patient and insurance preference. Use up to four times daily as directed. (FOR ICD-9 250.00, 250.01). 10/31/14    Annita Brod, MD  insulin starter kit- syringes MISC 1 kit by Other route once. 10/31/14   Annita Brod, MD    Family History Family History  Problem Relation Age of Onset  . Stroke Mother   . Hypertension Mother   . Diabetes Mother   . Breast cancer Mother   . Hypertension Father   . Ulcers Father        peptic  . Colon cancer Maternal Aunt     Social History Social History   Tobacco Use  . Smoking status: Never Smoker  . Smokeless tobacco: Never Used  Substance Use Topics  . Alcohol use: Yes    Comment: socially  . Drug use: No     Allergies   Patient has no known allergies.   Review of Systems Review of Systems  All other systems reviewed and are negative.    Physical Exam Updated Vital Signs BP 121/90 (BP Location: Right Arm)   Pulse (!) 108   Temp 98 F (36.7 C) (Oral)   Resp 20   Ht 6' (1.829 m)   Wt (!) 152.9 kg (337 lb)   SpO2 99%   BMI 45.71 kg/m   Vital signs normal except for tachycardia   Physical Exam  Constitutional: He is oriented to person, place, and time. He appears well-developed and well-nourished.  HENT:  Head: Normocephalic and atraumatic.  Right Ear: External ear normal.  Left Ear: External ear normal.  Nose: Nose normal.  Eyes: Conjunctivae and EOM are normal.  Neck: Neck supple.  Cardiovascular: Tachycardia present.  Pulmonary/Chest: Effort normal. No respiratory distress.  Musculoskeletal: Normal range of motion. He exhibits tenderness.  Neurological: He is alert and oriented to person, place, and time. No cranial nerve deficit.  Skin: Skin is warm and dry.  Patient has an area of swelling of the posterior right axilla and when I press on the area there is a small amount of pus that comes out of a small hole adjacent to it, however there are several other breaks in the skin that are more anterior that are leaking serosanguineous fluid.  There is no obvious induration around those areas.  Psychiatric: He has a  normal mood and affect. His behavior is normal. Thought content normal.  Nursing note and vitals reviewed.      ED Treatments / Results  Labs (all labs ordered are listed, but only abnormal results are displayed) Results for orders placed or performed during the hospital encounter of 08/29/17  Stat Gram stain  Result Value Ref Range   Specimen Description ABSCESS AXILLA    Gram Stain      RARE WBC PRESENT,BOTH PMN AND MONONUCLEAR RARE GRAM POSITIVE COCCI IN PAIRS RESULT CALLED TO, READ BACK BY  AND VERIFIED WITH: S HUERTAS,RN 08/30/17 [place] Performed at [facility], [place] [address], [place], [place] [address]    Report Status 08/30/2017 FINAL   Basic metabolic panel  Result Value Ref Range   Sodium 133 (L) 135 - 145 mmol/L   Potassium 4.0 3.5 - 5.1 mmol/L   Chloride 101 98 - 111 mmol/L   CO2 23 22 - 32 mmol/L   Glucose, Bld 514 (HH) 70 - 99 mg/dL   BUN 27 (H) 6 - 20 mg/dL   Creatinine, Ser 2.10 (H) 0.61 - 1.24 mg/dL   Calcium 9.0 8.9 - 10.3 mg/dL   GFR calc non Af Amer 36 (L) >60 mL/min   GFR calc Af Amer 42 (L) >60 mL/min   Anion gap 9 5 - 15  CBC  Result Value Ref Range   WBC 14.2 (H) 4.0 - 10.5 K/uL   RBC 5.67 4.22 - 5.81 MIL/uL   Hemoglobin 13.6 13.0 - 17.0 g/dL   HCT 40.5 39.0 - 52.0 %   MCV 71.4 (L) 78.0 - 100.0 fL   MCH 24.0 (L) 26.0 - 34.0 pg   MCHC 33.6 30.0 - 36.0 g/dL   RDW 15.9 (H) 11.5 - 15.5 %   Platelets 342 150 - 400 K/uL  Urinalysis, Routine w reflex microscopic  Result Value Ref Range   Color, Urine STRAW (A) YELLOW   APPearance CLEAR CLEAR   Specific Gravity, Urine 1.013 1.005 - 1.030   pH 5.0 5.0 - 8.0   Glucose, UA >=500 (A) NEGATIVE mg/dL   Hgb urine dipstick NEGATIVE NEGATIVE   Bilirubin Urine NEGATIVE NEGATIVE   Ketones, ur NEGATIVE NEGATIVE mg/dL   Protein, ur NEGATIVE NEGATIVE mg/dL   Nitrite NEGATIVE NEGATIVE   Leukocytes, UA NEGATIVE NEGATIVE   RBC / HPF 0-5 0 - 5 RBC/hpf   WBC, UA 0-5 0 - 5 WBC/hpf    Bacteria, UA NONE SEEN NONE SEEN  CBC  Result Value Ref Range   WBC 10.8 (H) 4.0 - 10.5 K/uL   RBC 5.35 4.22 - 5.81 MIL/uL   Hemoglobin 12.9 (L) 13.0 - 17.0 g/dL   HCT 38.4 (L) 39.0 - 52.0 %   MCV 71.8 (L) 78.0 - 100.0 fL   MCH 24.1 (L) 26.0 - 34.0 pg   MCHC 33.6 30.0 - 36.0 g/dL   RDW 15.9 (H) 11.5 - 15.5 %   Platelets 263 150 - 400 K/uL  Basic metabolic panel  Result Value Ref Range   Sodium 132 (L) 135 - 145 mmol/L   Potassium 3.7 3.5 - 5.1 mmol/L   Chloride 103 98 - 111 mmol/L   CO2 21 (L) 22 - 32 mmol/L   Glucose, Bld 439 (H) 70 - 99 mg/dL   BUN 23 (H) 6 - 20 mg/dL   Creatinine, Ser 2.01 (H) 0.61 - 1.24 mg/dL   Calcium 8.5 (L) 8.9 - 10.3 mg/dL   GFR calc non Af Amer 38 (L) >60 mL/min   GFR calc Af Amer 44 (L) >60 mL/min   Anion gap 8 5 - 15  CBG monitoring, ED  Result Value Ref Range   Glucose-Capillary 486 (H) 70 - 99 mg/dL  CBG monitoring, ED  Result Value Ref Range   Glucose-Capillary 459 (H) 70 - 99 mg/dL    Laboratory interpretation all normal except hyperglycemia without metabolic acidosis or elevated anion gap, leukocytosis, stable renal insufficiency   EKG None  Radiology No results found.  Procedures Procedures (including critical care time)  Medications Ordered in ED Medications  sodium chloride 0.9 % bolus 1,000 mL (0 mLs Intravenous Stopped 08/30/17 0149)  sodium chloride 0.9 % bolus 1,000 mL (0 mLs Intravenous Stopped 08/30/17 0300)  insulin aspart (novoLOG) injection 5 Units (5 Units Intravenous Given 08/30/17 0153)  vancomycin (VANCOCIN) IVPB 1000 mg/200 mL premix (0 mg Intravenous Stopped 08/30/17 0259)  piperacillin-tazobactam (ZOSYN) IVPB 3.375 g (0 g Intravenous Stopped 08/30/17 0229)  lidocaine (PF) (XYLOCAINE) 1 % injection 30 mL (30 mLs Infiltration Given 08/30/17 0229)  insulin aspart (novoLOG) injection 10 Units (10 Units Subcutaneous Given 08/30/17 0352)     Initial Impression / Assessment and Plan / ED Course  I have reviewed the  triage vital signs and the nursing notes.  Pertinent labs & imaging results that were available during my care of the patient were reviewed by me and considered in my medical decision making (see chart for details).     Patient was given IV fluids and IV insulin for his hyperglycemia.  When I check his prior blood work from our facility his blood sugars usually in the 400s.  Patient was given IV antibiotics.  The main abscess in his axilla had I and D done by PA Upstil   02:35 AM Dr Alcario Drought, hospitalist, will admit  Final Clinical Impressions(s) / ED Diagnoses   Final diagnoses:  Abscess of right axilla  Hyperglycemia    Plan admission  Rolland Porter, MD, Barbette Or, MD 08/30/17 [phone]

## 2017-08-30 NOTE — ED Notes (Signed)
CBG 431.  EDP was notified.

## 2017-08-30 NOTE — ED Notes (Signed)
IV fluid was started

## 2017-08-30 NOTE — ED Notes (Signed)
ED TO INPATIENT HANDOFF REPORT  Name/Age/Gender Parker West 47 y.o. male  Code Status    Code Status Orders  (From admission, onward)        Start     Ordered   08/30/17 0308  Full code  Continuous     08/30/17 0309    Code Status History    Date Active Date Inactive Code Status Order ID Comments User Context   09/05/2011 0347 09/10/2011 1540 Full Code [id number]  [place], Leonides Sake, RN Inpatient      Home/SNF/Other Home  Chief Complaint Boil under arm  Level of Care/Admitting Diagnosis ED Disposition    ED Disposition Condition [place]: [facility] [100102]  Level of Care: Med-Surg [16]  Diagnosis: Abscess of axilla, right [532992]  Admitting Physician: Etta Quill [[id number]]  Attending Physician: Etta Quill [[id number]]  PT Class (Do Not Modify): Observation [104]  PT Acc Code (Do Not Modify): Observation [10022]       Medical History Past Medical History:  Diagnosis Date  . Anginal pain ([place])   . Diabetes mellitus without complication ([place])   . Dyspnea 10/09/2011   At rest  Recent DVT   . Heart murmur    "when I was small"  . Hematochezia 10/09/2011   Low grade antedated DVT  . Hyperlipidemia   . Hypertension   . Microcytic normochromic anemia 10/09/2011   retic 1.6% Hb 9.9 10/09/11  . Obesity   . Pneumonia 1988   "walking"  . Staph infection 09/04/11   BLE/wife's report    Allergies No Known Allergies  IV Location/Drains/Wounds Patient Lines/Drains/Airways Status   Active Line/Drains/Airways    Name:   Placement date:   Placement time:   Site:   Days:   Peripheral IV 08/29/17 Right Antecubital   08/29/17    2346    Antecubital   1          Labs/Imaging Results for orders placed or performed during the hospital encounter of 08/29/17 (from the past 48 hour(s))  Urinalysis, Routine w reflex microscopic     Status: Abnormal   Collection Time: 08/29/17 11:29 PM  Result Value Ref Range   Color,  Urine STRAW (A) YELLOW   APPearance CLEAR CLEAR   Specific Gravity, Urine 1.013 1.005 - 1.030   pH 5.0 5.0 - 8.0   Glucose, UA >=500 (A) NEGATIVE mg/dL   Hgb urine dipstick NEGATIVE NEGATIVE   Bilirubin Urine NEGATIVE NEGATIVE   Ketones, ur NEGATIVE NEGATIVE mg/dL   Protein, ur NEGATIVE NEGATIVE mg/dL   Nitrite NEGATIVE NEGATIVE   Leukocytes, UA NEGATIVE NEGATIVE   RBC / HPF 0-5 0 - 5 RBC/hpf   WBC, UA 0-5 0 - 5 WBC/hpf   Bacteria, UA NONE SEEN NONE SEEN    Comment: Performed at [facility], [place] [address] [place], [place] [address]  CBG monitoring, ED     Status: Abnormal   Collection Time: 08/29/17 11:43 PM  Result Value Ref Range   Glucose-Capillary 486 (H) 70 - 99 mg/dL  Basic metabolic panel     Status: Abnormal   Collection Time: 08/29/17 11:45 PM  Result Value Ref Range   Sodium 133 (L) 135 - 145 mmol/L   Potassium 4.0 3.5 - 5.1 mmol/L   [place] 101 98 - 111 mmol/L    Comment: Please note change in reference range.   CO2 23 22 - 32 mmol/L   Glucose, Bld 514 (  HH) 70 - 99 mg/dL    Comment: Please note change in reference range. CRITICAL RESULT CALLED TO, READ BACK BY AND VERIFIED WITH: FLUCKIGER,E AT 0031 ON 08/30/2017 BY MOSLEY,J    BUN 27 (H) 6 - 20 mg/dL    Comment: Please note change in reference range.   Creatinine, Ser 2.10 (H) 0.61 - 1.24 mg/dL   Calcium 9.0 8.9 - 10.3 mg/dL   GFR calc non Af Amer 36 (L) >60 mL/min   GFR calc Af Amer 42 (L) >60 mL/min    Comment: (NOTE) The eGFR has been calculated using the CKD EPI equation. This calculation has not been validated in all clinical situations. eGFR's persistently <60 mL/min signify possible Chronic Kidney Disease.    Anion gap 9 5 - 15    Comment: Performed at [facility], [place] [address] [place], [place] [address]  CBC     Status: Abnormal   Collection Time: 08/29/17 11:45 PM  Result Value Ref Range   WBC 14.2 (H) 4.0 - 10.5 K/uL   RBC 5.67 4.22 - 5.81 MIL/uL    Hemoglobin 13.6 13.0 - 17.0 g/dL   HCT 40.5 39.0 - 52.0 %   MCV 71.4 (L) 78.0 - 100.0 fL   MCH 24.0 (L) 26.0 - 34.0 pg   MCHC 33.6 30.0 - 36.0 g/dL   RDW 15.9 (H) 11.5 - 15.5 %   Platelets 342 150 - 400 K/uL    Comment: Performed at [facility], [place] [address] [place], [place] [address]  CBG monitoring, ED     Status: Abnormal   Collection Time: 08/30/17  3:50 AM  Result Value Ref Range   Glucose-Capillary 459 (H) 70 - 99 mg/dL   No results found.  Pending Labs Unresulted Labs (From admission, onward)   Start     Ordered   08/30/17 0500  CBC  Tomorrow morning,   R     08/30/17 0309   08/30/17 9166  Basic metabolic panel  Tomorrow morning,   R     08/30/17 0309   08/30/17 0308  HIV antibody (Routine Testing)  Once,   R     08/30/17 0309   08/30/17 0302  Culture, blood (routine x 2)  BLOOD CULTURE X 2,   R    Comments:  for severe disease only    08/30/17 0301   08/30/17 0237  Aerobic Culture (superficial specimen)  Once,   R     08/30/17 0236   08/30/17 0236  Stat Gram stain  Once,   STAT     08/30/17 0236      Vitals/Pain Today's Vitals   08/29/17 2326 08/30/17 0150 08/30/17 0153 08/30/17 0344  BP:  (!) 153/105  123/83  Pulse:  96  85  Resp:  20  20  Temp:  97.9 F (36.6 C)    TempSrc:  Oral    SpO2:  100%  100%  Weight: (!) 337 lb (152.9 kg)     Height: 6' (1.829 m)     PainSc: _0 Isolation Precautions No active isolations  Medications Medications  clonazePAM (KLONOPIN) tablet 0.5-1 mg (has no administration in time range)  metoprolol succinate (TOPROL-XL) 24 hr tablet 25 mg (has no administration in time range)  aspirin chewable tablet 81 mg (has no administration in time range)  simvastatin (ZOCOR) tablet 40 mg (has no administration in time range)  traZODone (DESYREL) tablet 400 mg (  has no administration in time range)  insulin glargine (LANTUS) injection 40 Units (40 Units Subcutaneous Given 08/30/17 0347)  insulin  aspart (novoLOG) injection 0-20 Units (has no administration in time range)  acetaminophen (TYLENOL) tablet 650 mg (has no administration in time range)    Or  acetaminophen (TYLENOL) suppository 650 mg (has no administration in time range)  ondansetron (ZOFRAN) tablet 4 mg (has no administration in time range)    Or  ondansetron (ZOFRAN) injection 4 mg (has no administration in time range)  enoxaparin (LOVENOX) injection 40 mg (has no administration in time range)  losartan (COZAAR) tablet 100 mg (has no administration in time range)  sodium chloride 0.9 % bolus 1,000 mL (0 mLs Intravenous Stopped 08/30/17 0149)  sodium chloride 0.9 % bolus 1,000 mL (0 mLs Intravenous Stopped 08/30/17 0300)  insulin aspart (novoLOG) injection 5 Units (5 Units Intravenous Given 08/30/17 0153)  vancomycin (VANCOCIN) IVPB 1000 mg/200 mL premix (0 mg Intravenous Stopped 08/30/17 0259)  piperacillin-tazobactam (ZOSYN) IVPB 3.375 g (0 g Intravenous Stopped 08/30/17 0229)  lidocaine (PF) (XYLOCAINE) 1 % injection 30 mL (30 mLs Infiltration Given 08/30/17 0229)  insulin aspart (novoLOG) injection 10 Units (10 Units Subcutaneous Given 08/30/17 0352)    Mobility walks

## 2017-08-30 NOTE — Progress Notes (Signed)
Paged MD on call, [name] on patient's STAT gram stain result, with rare WBC present both PMN and mononuclear, rare gram (+) cocci. Awaiting for call back and further order.

## 2017-08-31 DIAGNOSIS — E860 Dehydration: Secondary | ICD-10-CM | POA: Diagnosis present

## 2017-08-31 DIAGNOSIS — M109 Gout, unspecified: Secondary | ICD-10-CM | POA: Diagnosis present

## 2017-08-31 DIAGNOSIS — N179 Acute kidney failure, unspecified: Secondary | ICD-10-CM | POA: Diagnosis present

## 2017-08-31 DIAGNOSIS — I129 Hypertensive chronic kidney disease with stage 1 through stage 4 chronic kidney disease, or unspecified chronic kidney disease: Secondary | ICD-10-CM | POA: Diagnosis present

## 2017-08-31 DIAGNOSIS — R739 Hyperglycemia, unspecified: Secondary | ICD-10-CM | POA: Diagnosis not present

## 2017-08-31 DIAGNOSIS — E1122 Type 2 diabetes mellitus with diabetic chronic kidney disease: Secondary | ICD-10-CM | POA: Diagnosis present

## 2017-08-31 DIAGNOSIS — Z794 Long term (current) use of insulin: Secondary | ICD-10-CM | POA: Diagnosis not present

## 2017-08-31 DIAGNOSIS — Z6841 Body Mass Index (BMI) 40.0 and over, adult: Secondary | ICD-10-CM | POA: Diagnosis not present

## 2017-08-31 DIAGNOSIS — N183 Chronic kidney disease, stage 3 (moderate): Secondary | ICD-10-CM

## 2017-08-31 DIAGNOSIS — D72829 Elevated white blood cell count, unspecified: Secondary | ICD-10-CM | POA: Diagnosis present

## 2017-08-31 DIAGNOSIS — I10 Essential (primary) hypertension: Secondary | ICD-10-CM | POA: Diagnosis not present

## 2017-08-31 DIAGNOSIS — Z86718 Personal history of other venous thrombosis and embolism: Secondary | ICD-10-CM | POA: Diagnosis not present

## 2017-08-31 DIAGNOSIS — Z833 Family history of diabetes mellitus: Secondary | ICD-10-CM | POA: Diagnosis not present

## 2017-08-31 DIAGNOSIS — Z79899 Other long term (current) drug therapy: Secondary | ICD-10-CM | POA: Diagnosis not present

## 2017-08-31 DIAGNOSIS — L02411 Cutaneous abscess of right axilla: Principal | ICD-10-CM

## 2017-08-31 DIAGNOSIS — E785 Hyperlipidemia, unspecified: Secondary | ICD-10-CM | POA: Diagnosis present

## 2017-08-31 DIAGNOSIS — E1165 Type 2 diabetes mellitus with hyperglycemia: Secondary | ICD-10-CM | POA: Diagnosis present

## 2017-08-31 DIAGNOSIS — D509 Iron deficiency anemia, unspecified: Secondary | ICD-10-CM | POA: Diagnosis present

## 2017-08-31 DIAGNOSIS — I959 Hypotension, unspecified: Secondary | ICD-10-CM | POA: Diagnosis not present

## 2017-08-31 DIAGNOSIS — Z7982 Long term (current) use of aspirin: Secondary | ICD-10-CM | POA: Diagnosis not present

## 2017-08-31 DIAGNOSIS — Z8249 Family history of ischemic heart disease and other diseases of the circulatory system: Secondary | ICD-10-CM | POA: Diagnosis not present

## 2017-08-31 LAB — COMPREHENSIVE METABOLIC PANEL
ALBUMIN: 2.8 g/dL — AB (ref 3.5–5.0)
ALT: 13 U/L (ref 0–44)
ANION GAP: 5 (ref 5–15)
AST: 13 U/L — ABNORMAL LOW (ref 15–41)
Alkaline Phosphatase: 66 U/L (ref 38–126)
BUN: 20 mg/dL (ref 6–20)
CALCIUM: 8.8 mg/dL — AB (ref 8.9–10.3)
CHLORIDE: 106 mmol/L (ref 98–111)
CO2: 27 mmol/L (ref 22–32)
Creatinine, Ser: 1.88 mg/dL — ABNORMAL HIGH (ref 0.61–1.24)
GFR calc Af Amer: 47 mL/min — ABNORMAL LOW (ref >60)
GFR calc non Af Amer: 41 mL/min — ABNORMAL LOW (ref >60)
GLUCOSE: 195 mg/dL — AB (ref 70–99)
Potassium: 4.1 mmol/L (ref 3.5–5.1)
SODIUM: 138 mmol/L (ref 135–145)
Total Bilirubin: 0.6 mg/dL (ref 0.3–1.2)
Total Protein: 7.1 g/dL (ref 6.5–8.1)

## 2017-08-31 LAB — HEMOGLOBIN A1C
HEMOGLOBIN A1C: 14.9 % — AB (ref 4.8–5.6)
Hgb A1c MFr Bld: 14.5 % — ABNORMAL HIGH (ref 4.8–5.6)
MEAN PLASMA GLUCOSE: 369.45 mg/dL
Mean Plasma Glucose: 381 mg/dL

## 2017-08-31 LAB — GLUCOSE, CAPILLARY
GLUCOSE-CAPILLARY: 261 mg/dL — AB (ref 70–99)
GLUCOSE-CAPILLARY: 199 mg/dL — AB (ref 70–99)
GLUCOSE-CAPILLARY: 217 mg/dL — AB (ref 70–99)
Glucose-Capillary: 227 mg/dL — ABNORMAL HIGH (ref 70–99)

## 2017-08-31 LAB — CBC
HCT: 39.2 % (ref 39.0–52.0)
Hemoglobin: 13.2 g/dL (ref 13.0–17.0)
MCH: 24.2 pg — ABNORMAL LOW (ref 26.0–34.0)
MCHC: 33.7 g/dL (ref 30.0–36.0)
MCV: 71.9 fL — ABNORMAL LOW (ref 78.0–100.0)
Platelets: 284 10*3/uL (ref 150–400)
RBC: 5.45 MIL/uL (ref 4.22–5.81)
RDW: 16.1 % — ABNORMAL HIGH (ref 11.5–15.5)
WBC: 13 10*3/uL — ABNORMAL HIGH (ref 4.0–10.5)

## 2017-08-31 MED ORDER — CEFAZOLIN SODIUM-DEXTROSE 1-4 GM/50ML-% IV SOLN
1.0000 g | Freq: Three times a day (TID) | INTRAVENOUS | Status: DC
  Administered 2017-08-31 – 2017-09-01 (×3): 1 g via INTRAVENOUS
  Filled 2017-08-31 (×3): qty 50

## 2017-08-31 MED ORDER — SODIUM CHLORIDE 0.9 % IV SOLN
INTRAVENOUS | Status: DC
  Administered 2017-08-31 – 2017-09-01 (×2): via INTRAVENOUS

## 2017-08-31 NOTE — Consult Note (Signed)
Reason for Consult:abscess Referring Physician: Dr. Diana Eves is a 47 y.o. male.  HPI: The patient is a 47 year old black male who presents with right axillary pain for the last 3 weeks. Denies fever. Was found in ER to have an abscess in the right axilla. This was I and D'd by the ER doc and packed with iodoform. He feels better  Past Medical History:  Diagnosis Date  . Anginal pain ([place])   . Diabetes mellitus without complication ([place])   . Dyspnea 10/09/2011   At rest  Recent DVT   . Heart murmur    "when I was small"  . Hematochezia 10/09/2011   Low grade antedated DVT  . Hyperlipidemia   . Hypertension   . Microcytic normochromic anemia 10/09/2011   retic 1.6% Hb 9.9 10/09/11  . Obesity   . Pneumonia 1988   "walking"  . Staph infection 09/04/11   BLE/wife's report    Past Surgical History:  Procedure Laterality Date  . EYE SURGERY     stye excision  . REDUCTION MAMMAPLASTY  1990's    Family History  Problem Relation Age of Onset  . Stroke Mother   . Hypertension Mother   . Diabetes Mother   . Breast cancer Mother   . Hypertension Father   . Ulcers Father        peptic  . Colon cancer Maternal Aunt     Social History:  reports that he has never smoked. He has never used smokeless tobacco. He reports that he drinks alcohol. He reports that he does not use drugs.  Allergies: No Known Allergies  Medications: I have reviewed the patient's current medications.  Results for orders placed or performed during the hospital encounter of 08/29/17 (from the past 48 hour(s))  Urinalysis, Routine w reflex microscopic     Status: Abnormal   Collection Time: 08/29/17 11:29 PM  Result Value Ref Range   Color, Urine STRAW (A) YELLOW   APPearance CLEAR CLEAR   Specific Gravity, Urine 1.013 1.005 - 1.030   pH 5.0 5.0 - 8.0   Glucose, UA >=500 (A) NEGATIVE mg/dL   Hgb urine dipstick NEGATIVE NEGATIVE   Bilirubin Urine NEGATIVE NEGATIVE   Ketones, ur NEGATIVE NEGATIVE  mg/dL   Protein, ur NEGATIVE NEGATIVE mg/dL   Nitrite NEGATIVE NEGATIVE   Leukocytes, UA NEGATIVE NEGATIVE   RBC / HPF 0-5 0 - 5 RBC/hpf   WBC, UA 0-5 0 - 5 WBC/hpf   Bacteria, UA NONE SEEN NONE SEEN    Comment: Performed at [facility], [place] [address], [place], [place] [address]  CBG monitoring, ED     Status: Abnormal   Collection Time: 08/29/17 11:43 PM  Result Value Ref Range   Glucose-Capillary 486 (H) 70 - 99 mg/dL  Basic metabolic panel     Status: Abnormal   Collection Time: 08/29/17 11:45 PM  Result Value Ref Range   Sodium 133 (L) 135 - 145 mmol/L   Potassium 4.0 3.5 - 5.1 mmol/L   [place] 101 98 - 111 mmol/L    Comment: Please note change in reference range.   CO2 23 22 - 32 mmol/L   Glucose, Bld 514 (HH) 70 - 99 mg/dL    Comment: Please note change in reference range. CRITICAL RESULT CALLED TO, READ BACK BY AND VERIFIED WITH: FLUCKIGER,E AT 0031 ON 08/30/2017 BY MOSLEY,J    BUN 27 (H) 6 - 20 mg/dL    Comment: Please  note change in reference range.   Creatinine, Ser 2.10 (H) 0.61 - 1.24 mg/dL   Calcium 9.0 8.9 - 10.3 mg/dL   GFR calc non Af Amer 36 (L) >60 mL/min   GFR calc Af Amer 42 (L) >60 mL/min    Comment: (NOTE) The eGFR has been calculated using the CKD EPI equation. This calculation has not been validated in all clinical situations. eGFR's persistently <60 mL/min signify possible Chronic Kidney Disease.    Anion gap 9 5 - 15    Comment: Performed at [facility], [place] [address], [place], [place] [address]  CBC     Status: Abnormal   Collection Time: 08/29/17 11:45 PM  Result Value Ref Range   WBC 14.2 (H) 4.0 - 10.5 K/uL   RBC 5.67 4.22 - 5.81 MIL/uL   Hemoglobin 13.6 13.0 - 17.0 g/dL   HCT 40.5 39.0 - 52.0 %   MCV 71.4 (L) 78.0 - 100.0 fL   MCH 24.0 (L) 26.0 - 34.0 pg   MCHC 33.6 30.0 - 36.0 g/dL   RDW 15.9 (H) 11.5 - 15.5 %   Platelets 342 150 - 400 K/uL    Comment: Performed at [facility], [place] [address], [place], [place] [address]  Stat Gram stain     Status: None   Collection Time: 08/30/17  3:00 AM  Result Value Ref Range   Specimen Description ABSCESS AXILLA    Gram Stain      RARE WBC PRESENT,BOTH PMN AND MONONUCLEAR RARE GRAM POSITIVE COCCI IN PAIRS RESULT CALLED TO, READ BACK BY AND VERIFIED WITH: S HUERTAS,RN 08/30/17 [place] Performed at [facility], [place] [address], [place], [place] [address]    Report Status 08/30/2017 FINAL   Aerobic Culture (superficial specimen)     Status: None (Preliminary result)   Collection Time: 08/30/17  3:00 AM  Result Value Ref Range   Specimen Description      ABSCESS AXILLA Performed at [place] [address], [place], [place] [address]    Special Requests      NONE Performed at [facility], [place] [address] [place], [place] [address]    Culture PENDING    Report Status PENDING   CBG monitoring, ED     Status: Abnormal   Collection Time: 08/30/17  3:50 AM  Result Value Ref Range   Glucose-Capillary 459 (H) 70 - 99 mg/dL  HIV antibody (Routine Testing)     Status: None   Collection Time: 08/30/17  5:15 AM  Result Value Ref Range   HIV Screen 4th Generation wRfx Non Reactive Non Reactive    Comment: (NOTE) Performed At: [facility] [address] [place], [place] [id number] Rush Farmer MD EZ:[id number] Performed at [facility], [place] [address] [place], [place] [address]   CBC     Status: Abnormal   Collection Time: 08/30/17  5:15 AM  Result Value Ref Range   WBC 10.8 (H) 4.0 - 10.5 K/uL   RBC 5.35 4.22 - 5.81 MIL/uL   Hemoglobin 12.9 (L) 13.0 - 17.0 g/dL   HCT 38.4 (L) 39.0 - 52.0 %   MCV 71.8 (L) 78.0 - 100.0 fL   MCH 24.1 (L) 26.0 - 34.0 pg   MCHC 33.6 30.0 - 36.0 g/dL   RDW 15.9 (H) 11.5 - 15.5 %   Platelets 263 150 - 400 K/uL    Comment: Performed at [facility],  [place] [address]  [place], [place] [address]  Basic metabolic panel     Status: Abnormal   Collection Time: 08/30/17  5:15 AM  Result Value Ref Range   Sodium 132 (L) 135 - 145 mmol/L   Potassium 3.7 3.5 - 5.1 mmol/L   [place] 103 98 - 111 mmol/L    Comment: Please note change in reference range.   CO2 21 (L) 22 - 32 mmol/L   Glucose, Bld 439 (H) 70 - 99 mg/dL    Comment: Please note change in reference range.   BUN 23 (H) 6 - 20 mg/dL    Comment: Please note change in reference range.   Creatinine, Ser 2.01 (H) 0.61 - 1.24 mg/dL   Calcium 8.5 (L) 8.9 - 10.3 mg/dL   GFR calc non Af Amer 38 (L) >60 mL/min   GFR calc Af Amer 44 (L) >60 mL/min    Comment: (NOTE) The eGFR has been calculated using the CKD EPI equation. This calculation has not been validated in all clinical situations. eGFR's persistently <60 mL/min signify possible Chronic Kidney Disease.    Anion gap 8 5 - 15    Comment: Performed at [facility], [place] [address] [place], [place] [address]  Glucose, capillary     Status: Abnormal   Collection Time: 08/30/17  7:24 AM  Result Value Ref Range   Glucose-Capillary 329 (H) 70 - 99 mg/dL  MRSA PCR Screening     Status: None   Collection Time: 08/30/17 10:00 AM  Result Value Ref Range   MRSA by PCR NEGATIVE NEGATIVE    Comment:        The GeneXpert MRSA Assay (FDA approved for NASAL specimens only), is one component of a comprehensive MRSA colonization surveillance program. It is not intended to diagnose MRSA infection nor to guide or monitor treatment for MRSA infections. Performed at [facility], [place] [address], [place], [place] [address]   Glucose, capillary     Status: Abnormal   Collection Time: 08/30/17 11:39 AM  Result Value Ref Range   Glucose-Capillary 211 (H) 70 - 99 mg/dL  Glucose, capillary     Status: Abnormal   Collection Time: 08/30/17  4:48 PM  Result Value Ref Range   Glucose-Capillary 146 (H) 70 - 99 mg/dL  Glucose,  capillary     Status: Abnormal   Collection Time: 08/30/17 11:57 PM  Result Value Ref Range   Glucose-Capillary 189 (H) 70 - 99 mg/dL  CBC     Status: Abnormal   Collection Time: 08/31/17  4:41 AM  Result Value Ref Range   WBC 13.0 (H) 4.0 - 10.5 K/uL   RBC 5.45 4.22 - 5.81 MIL/uL   Hemoglobin 13.2 13.0 - 17.0 g/dL   HCT 39.2 39.0 - 52.0 %   MCV 71.9 (L) 78.0 - 100.0 fL   MCH 24.2 (L) 26.0 - 34.0 pg   MCHC 33.7 30.0 - 36.0 g/dL   RDW 16.1 (H) 11.5 - 15.5 %   Platelets 284 150 - 400 K/uL    Comment: Performed at [facility], [place] [address] [place], [place] [address]  Comprehensive metabolic panel     Status: Abnormal   Collection Time: 08/31/17  4:41 AM  Result Value Ref Range   Sodium 138 135 - 145 mmol/L   Potassium 4.1 3.5 - 5.1 mmol/L   [place] 106 98 - 111 mmol/L    Comment: Please note change in reference range.  CO2 27 22 - 32 mmol/L   Glucose, Bld 195 (H) 70 - 99 mg/dL    Comment: Please note change in reference range.   BUN 20 6 - 20 mg/dL    Comment: Please note change in reference range.   Creatinine, Ser 1.88 (H) 0.61 - 1.24 mg/dL   Calcium 8.8 (L) 8.9 - 10.3 mg/dL   Total Protein 7.1 6.5 - 8.1 g/dL   Albumin 2.8 (L) 3.5 - 5.0 g/dL   AST 13 (L) 15 - 41 U/L   ALT 13 0 - 44 U/L    Comment: Please note change in reference range.   Alkaline Phosphatase 66 38 - 126 U/L   Total Bilirubin 0.6 0.3 - 1.2 mg/dL   GFR calc non Af Amer 41 (L) >60 mL/min   GFR calc Af Amer 47 (L) >60 mL/min    Comment: (NOTE) The eGFR has been calculated using the CKD EPI equation. This calculation has not been validated in all clinical situations. eGFR's persistently <60 mL/min signify possible Chronic Kidney Disease.    Anion gap 5 5 - 15    Comment: Performed at [facility], [place] [address] [place], [place] [address]  Glucose, capillary     Status: Abnormal   Collection Time: 08/31/17  7:16 AM  Result Value Ref Range   Glucose-Capillary  227 (H) 70 - 99 mg/dL    No results found.  Review of Systems  Constitutional: Negative.   HENT: Negative.   Eyes: Negative.   Respiratory: Negative.   Cardiovascular: Negative.   Gastrointestinal: Negative.   Genitourinary: Negative.   Musculoskeletal: Negative.   Skin: Negative.   Neurological: Negative.   Endo/Heme/Allergies: Negative.   Psychiatric/Behavioral: Negative.    Blood pressure 117/84, pulse 91, temperature 98 F (36.7 C), temperature source Oral, resp. rate 16, height 6' (1.829 m), weight (!) 152.9 kg (337 lb), SpO2 99 %. Physical Exam  Constitutional: He is oriented to person, place, and time. He appears well-developed and well-nourished. No distress.  HENT:  Head: Normocephalic and atraumatic.  Mouth/Throat: No oropharyngeal exudate.  Eyes: Pupils are equal, round, and reactive to light. Conjunctivae and EOM are normal.  Neck: Normal range of motion. Neck supple.  Cardiovascular: Normal rate, regular rhythm and normal heart sounds.  Respiratory: Effort normal and breath sounds normal. No stridor. No respiratory distress.  GI: Soft. Bowel sounds are normal. There is no tenderness.  Musculoskeletal: Normal range of motion. He exhibits no edema or tenderness.  Neurological: He is alert and oriented to person, place, and time. Coordination normal.  Skin: Skin is warm and dry.  There is an open wound in the right axilla with packing. There is a moderate amount of purulent drainage. No cellulitis  Psychiatric: He has a normal mood and affect. His behavior is normal. Thought content normal.    Assessment/Plan: The patient has an abscess in the right axilla that has been opened and drained. At this point the wound packing will need to be changed daily. Once the patient and family are comfortable doing the dressing change I suspect he could go home with oral abx and follow up in the surgical clinic. The wound needs to be repacked every day until the drainage resolves.  He may shower and have the packing replaced after the shower.  TOTH III,Tashae Inda S 08/31/2017, 7:53 AM

## 2017-08-31 NOTE — Progress Notes (Signed)
CSW received consult that pt needing medication assistance- CSW unable to assist with medications- informed RNCM who will follow up as needed  Burna SisJenna H. Marlina Cataldi, LCSW Clinical Social Worker [phone]

## 2017-08-31 NOTE — Progress Notes (Signed)
PT Cancellation Note  Patient Details Name: Parker West MRN: [id number] DOB: 11/17/1970   Cancelled Treatment:     PT order received but eval deferred - pt states he is IND with all mobility, RN reports pt ambulating in halls unassisted.  NO PT needs at this time.  Will sign off.   Braxtin Bamba 08/31/2017, 9:17 AM

## 2017-08-31 NOTE — Progress Notes (Signed)
PROGRESS NOTE  JESSIE SCHRIEBER ZOX:[id number] DOB: May 15, 1970 DOA: 08/29/2017 PCP: Daisy Floro, MD  HPI/Recap of past 24 hours: Donata Clay a 47 y.o.malewith medical history significant ofDM2, very poorly controlled, CKD stage 3, HTN. Patient presents to the ED with swelling, pain, drainage from R axilla. Onset 3 weeks ago. Persistent. Hasnt had any treatment or ABx yet for this.  08/30/2017: Patient seen and examined with family members at bedside.  Pain is well controlled on current pain management.  Gram stain positive for rare gram-positive cocci in pairs.  Started p.o. Keflex 500 mg 4 times daily.  Awaiting sensitivities. Negative  08/31/2017: Patient seen and examined at his bedside.  Continues to have drainage from his right axillary abcess.  Pain is well controlled.  General surgery consulted and following.  Started IV antibiotics.  Packing will need to be changed daily.  Patient needs to be educated on packing.    Assessment/Plan: Principal Problem:   Abscess of axilla, right Active Problems:   Essential hypertension   DM2 (diabetes mellitus, type 2) (HCC)   CKD stage 3 secondary to diabetes (HCC)   Right axillary abscess status post I&D on 08/30/2017 MRSA negative Body fluid culture revealed rare gram-positive cocci in pairs Start IV cefazolin Monitor fever curve Monitor CBC, WBC Obtain CBC in the morning General surgery consulted and following.  Highly appreciated Per general surgery wound packing would need to be changed daily Please RN teach the patient/wife how to do dressing changes  Leukocytosis WBC 13 K Secondary to abscess versus reactive Afebrile Repeat CBC in the morning  Uncontrolled type 2 diabetes Resume 70/30 insulin Continue insulin sliding scale Continue regular Accu-Chek Obtain hemoglobin A1c Obtain BMP in the morning  AKI on CKD 3 Baseline creatinine 1.8 Creatinine today 1.88 with GFR of 47 Creatinine on presentation  2.10 Avoid nephrotoxic agents/hypotension/dehydration Start gentle IV fluid normal saline at 75 cc/h Monitor urine output Repeat BMP in the morning  Morbid obesity with BMI 45 Recommend weight loss outpatient Recommend regular exercise Follow-up with PCP outpatient  Hypertension Blood pressures well controlled Continue chlorthalidone, losartan, Toprol-XL  Hyperlipidemia Continue Zocor  Gout No sign of acute flare Continue allopurinol     Code Status: full  Family Communication: none at bedside today. Yesterday spoke with son.  Disposition Plan: Home when clinically stable   Consultants:  Gen surgery  Procedures:  I&D on 08/30/17 in the ED [facility]  Antimicrobials:  IV cefazolin  DVT prophylaxis:  sq lovenox daily   Objective: Vitals:   08/30/17 0344 08/30/17 1327 08/30/17 2217 08/31/17 0610  BP: 123/83 (!) 138/95 (!) 127/94 117/84  Pulse: 85 81 99 91  Resp: 20 16 16 16   Temp:  97.9 F (36.6 C) 98.2 F (36.8 C) 98 F (36.7 C)  TempSrc:  Oral Oral Oral  SpO2: 100% 100% 100% 99%  Weight:      Height:        Intake/Output Summary (Last 24 hours) at 08/31/2017 1304 Last data filed at 08/31/2017 0559 Gross per 24 hour  Intake 840 ml  Output -  Net 840 ml   Filed Weights   08/29/17 2326  Weight: (!) 152.9 kg (337 lb)    Exam:  . General: 47 y.o. year-old male obese in no acute distress.  Alert and oriented x3. . Cardiovascular: Regular rate and rhythm with no rubs or gallops.  No thyromegaly or JVD noted.   Marland Kitchen Respiratory: Clear to auscultation with no wheezes or rales. Good  inspiratory effort. . Abdomen: Soft nontender nondistended with normal bowel sounds x4 quadrants. . Musculoskeletal: No lower extremity edema. 2/4 pulses in all 4 extremities. . Skin: R axillary wound with clear drainage. Marland Kitchen. Psychiatry: Mood is appropriate for condition and setting   Data Reviewed: CBC: Recent Labs  Lab 08/29/17 2345 08/30/17 0515 08/31/17 0441  WBC  14.2* 10.8* 13.0*  HGB 13.6 12.9* 13.2  HCT 40.5 38.4* 39.2  MCV 71.4* 71.8* 71.9*  PLT 342 263 284   Basic Metabolic Panel: Recent Labs  Lab 08/29/17 2345 08/30/17 0515 08/31/17 0441  NA 133* 132* 138  K 4.0 3.7 4.1  CL 101 103 106  CO2 23 21* 27  GLUCOSE 514* 439* 195*  BUN 27* 23* 20  CREATININE 2.10* 2.01* 1.88*  CALCIUM 9.0 8.5* 8.8*   GFR: Estimated Creatinine Clearance: 74 mL/min (A) (by C-G formula based on SCr of 1.88 mg/dL (H)). Liver Function Tests: Recent Labs  Lab 08/31/17 0441  AST 13*  ALT 13  ALKPHOS 66  BILITOT 0.6  PROT 7.1  ALBUMIN 2.8*   No results for input(s): LIPASE, AMYLASE in the last 168 hours. No results for input(s): AMMONIA in the last 168 hours. Coagulation Profile: No results for input(s): INR, PROTIME in the last 168 hours. Cardiac Enzymes: No results for input(s): CKTOTAL, CKMB, CKMBINDEX, TROPONINI in the last 168 hours. BNP (last 3 results) No results for input(s): PROBNP in the last 8760 hours. HbA1C: Recent Labs    08/30/17 0501  HGBA1C 14.9*   CBG: Recent Labs  Lab 08/30/17 1139 08/30/17 1648 08/30/17 2357 08/31/17 0716 08/31/17 1227  GLUCAP 211* 146* 189* 227* 261*   Lipid Profile: No results for input(s): CHOL, HDL, LDLCALC, TRIG, CHOLHDL, LDLDIRECT in the last 72 hours. Thyroid Function Tests: No results for input(s): TSH, T4TOTAL, FREET4, T3FREE, THYROIDAB in the last 72 hours. Anemia Panel: No results for input(s): VITAMINB12, FOLATE, FERRITIN, TIBC, IRON, RETICCTPCT in the last 72 hours. Urine analysis:    Component Value Date/Time   COLORURINE STRAW (A) 08/29/2017 2329   APPEARANCEUR CLEAR 08/29/2017 2329   LABSPEC 1.013 08/29/2017 2329   LABSPEC 1.010 10/10/2011 1319   PHURINE 5.0 08/29/2017 2329   GLUCOSEU >=500 (A) 08/29/2017 2329   HGBUR NEGATIVE 08/29/2017 2329   BILIRUBINUR NEGATIVE 08/29/2017 2329   BILIRUBINUR Negative 10/10/2011 1319   KETONESUR NEGATIVE 08/29/2017 2329   PROTEINUR  NEGATIVE 08/29/2017 2329   UROBILINOGEN 0.2 10/30/2014 0033   NITRITE NEGATIVE 08/29/2017 2329   LEUKOCYTESUR NEGATIVE 08/29/2017 2329   LEUKOCYTESUR Negative 10/10/2011 1319   Sepsis Labs: @LABRCNTIP (procalcitonin:4,lacticidven:4)  ) Recent Results (from the past 240 hour(s))  Stat Gram stain     Status: None   Collection Time: 08/30/17  3:00 AM  Result Value Ref Range Status   Specimen Description ABSCESS AXILLA  Final   Gram Stain   Final    RARE WBC PRESENT,BOTH PMN AND MONONUCLEAR RARE GRAM POSITIVE COCCI IN PAIRS RESULT CALLED TO, READ BACK BY AND VERIFIED WITH: S HUERTAS,RN 08/30/17 0438 RHOLMES Performed at [facility], [address] [place], [place] [address]    Report Status 08/30/2017 FINAL  Final  Aerobic Culture (superficial specimen)     Status: None (Preliminary result)   Collection Time: 08/30/17  3:00 AM  Result Value Ref Range Status   Specimen Description   Final    ABSCESS AXILLA Performed at [facility], [address] [place], [place] [address]    Special Requests NONE  Final   Culture   Final    RARE UNIDENTIFIED ORGANISM Performed at [facility], [address], [place], [place] [address]    Report Status PENDING  Incomplete  MRSA PCR Screening     Status: None   Collection Time: 08/30/17 10:00 AM  Result Value Ref Range Status   MRSA by PCR NEGATIVE NEGATIVE Final    Comment:        The GeneXpert MRSA Assay (FDA approved for NASAL specimens only), is one component of a comprehensive MRSA colonization surveillance program. It is not intended to diagnose MRSA infection nor to guide or monitor treatment for MRSA infections. Performed at [facility], [address] [place], [place] [address]       Studies: No results found.  Scheduled Meds: . aspirin  81 mg Oral Daily  . enoxaparin (LOVENOX) injection  80 mg Subcutaneous Q24H  . insulin aspart  0-20 Units  Subcutaneous TID WC  . insulin aspart protamine- aspart  25 Units Subcutaneous BID WC  . losartan  100 mg Oral Daily  . metoprolol succinate  25 mg Oral Daily  . simvastatin  40 mg Oral Daily    Continuous Infusions: . sodium chloride 75 mL/hr at 08/31/17 1050  .  ceFAZolin (ANCEF) IV Stopped (08/31/17 1125)     LOS: 0 days     Darlin Drop, MD [facility] Pager [phone]  If 7PM-7AM, please contact night-coverage [email or web address] Password [email or web address] 08/31/2017, 1:04 PM

## 2017-08-31 NOTE — Plan of Care (Signed)
Plan of care discussed.   

## 2017-09-01 LAB — BASIC METABOLIC PANEL
Anion gap: 7 (ref 5–15)
BUN: 19 mg/dL (ref 6–20)
CHLORIDE: 104 mmol/L (ref 98–111)
CO2: 23 mmol/L (ref 22–32)
CREATININE: 1.69 mg/dL — AB (ref 0.61–1.24)
Calcium: 8.2 mg/dL — ABNORMAL LOW (ref 8.9–10.3)
GFR calc non Af Amer: 47 mL/min — ABNORMAL LOW (ref >60)
GFR, EST AFRICAN AMERICAN: 54 mL/min — AB (ref >60)
Glucose, Bld: 265 mg/dL — ABNORMAL HIGH (ref 70–99)
Potassium: 3.9 mmol/L (ref 3.5–5.1)
SODIUM: 134 mmol/L — AB (ref 135–145)

## 2017-09-01 LAB — GLUCOSE, CAPILLARY
GLUCOSE-CAPILLARY: 257 mg/dL — AB (ref 70–99)
Glucose-Capillary: 431 mg/dL — ABNORMAL HIGH (ref 70–99)
Glucose-Capillary: 175 mg/dL — ABNORMAL HIGH (ref 70–99)

## 2017-09-01 LAB — AEROBIC CULTURE  (SUPERFICIAL SPECIMEN)

## 2017-09-01 LAB — CBC
HCT: 38.8 % — ABNORMAL LOW (ref 39.0–52.0)
HEMOGLOBIN: 12.6 g/dL — AB (ref 13.0–17.0)
MCH: 23.6 pg — AB (ref 26.0–34.0)
MCHC: 32.5 g/dL (ref 30.0–36.0)
MCV: 72.7 fL — ABNORMAL LOW (ref 78.0–100.0)
Platelets: 274 10*3/uL (ref 150–400)
RBC: 5.34 MIL/uL (ref 4.22–5.81)
RDW: 16.2 % — ABNORMAL HIGH (ref 11.5–15.5)
WBC: 12.4 10*3/uL — ABNORMAL HIGH (ref 4.0–10.5)

## 2017-09-01 LAB — AEROBIC CULTURE W GRAM STAIN (SUPERFICIAL SPECIMEN)

## 2017-09-01 LAB — LACTIC ACID, PLASMA: LACTIC ACID, VENOUS: 0.8 mmol/L (ref 0.5–1.9)

## 2017-09-01 MED ORDER — CEPHALEXIN 500 MG PO CAPS: 500.0000 mg | ORAL_CAPSULE | Freq: Four times a day (QID) | ORAL | 0 refills | Status: AC

## 2017-09-01 MED ORDER — CEPHALEXIN 500 MG PO CAPS
500.0000 mg | ORAL_CAPSULE | Freq: Four times a day (QID) | ORAL | Status: DC
  Administered 2017-09-01 (×2): 500 mg via ORAL
  Filled 2017-09-01 (×2): qty 1

## 2017-09-01 MED ORDER — INSULIN ASPART 100 UNIT/ML ~~LOC~~ SOLN: 0.0000 [IU] | Freq: Three times a day (TID) | SUBCUTANEOUS | 0 refills | Status: DC

## 2017-09-01 MED ORDER — INSULIN ASPART PROT & ASPART (70-30 MIX) 100 UNIT/ML ~~LOC~~ SUSP: 25.0000 [IU] | Freq: Two times a day (BID) | SUBCUTANEOUS | 0 refills | Status: DC

## 2017-09-01 MED ORDER — METOPROLOL SUCCINATE ER 25 MG PO TB24: 25.0000 mg | ORAL_TABLET | Freq: Every day | ORAL | 0 refills | Status: DC

## 2017-09-01 NOTE — Progress Notes (Signed)
CC:  Right axillary abscess  Subjective: He has a small quarter inch piece of iodoform stuck in the I&D site.  I removed that he is also got an area that is denuded above that.  But I do not feel any new fluid pockets.  I told him we need to keep that area clean and dry.  The nurse is going to clean it with soap and water put him in the shower and then stick a piece of iodoform gauze in open site.  I do not think he needs any further surgical intervention at this point.  Objective: Vital signs in last 24 hours: Temp:  [97.4 F (36.3 C)-98.3 F (36.8 C)] 97.4 F (36.3 C) (07/01 0539) Pulse Rate:  [78-89] 86 (07/01 0813) Resp:  [13-17] 13 (07/01 0539) BP: (124-136)/(70-96) 124/81 (07/01 0813) SpO2:  [99 %-100 %] 99 % (07/01 0539) Last BM Date: 08/31/17 600 p.o. 1500 IV Urine x4 Stool x1 Afebrile blood pressure is elevated, but improving Glucose is ranging between 195 and 265. WBC still elevated at 12.4.  ABSCESS AXILLA  Performed at [facility], [address] [place], [place] [address]     Special Requests NONE   Culture RARE GROUP B STREP(S.AGALACTIAE)ISOLATED  TESTING AGAINST S. AGALACTIAE NOT ROUTINELY PERFORMED DUE TO PREDICTABILITY OF AMP/PEN/VAN SUSCEPTIBILITY.       Intake/Output from previous day: 06/30 0701 - 07/01 0700 In: 2062.5 [P.O.:600; I.V.:1362.5; IV Piggyback:100] Out: -  Intake/Output this shift: No intake/output data recorded.  General appearance: alert, cooperative and no distress Skin: Open site, with dressing removed.  He has some ongoing drainage from the site.  He also has an area above the I&D site where the skin has broken down and excoriated.  I do not feel any further fluid collections underneath his axilla.  Lab Results:  Recent Labs    08/31/17 0441 09/01/17 0430  WBC 13.0* 12.4*  HGB 13.2 12.6*  HCT 39.2 38.8*  PLT 284 274    BMET Recent Labs    08/31/17 0441 09/01/17 0430  NA 138 134*  K 4.1 3.9   CL 106 104  CO2 27 23  GLUCOSE 195* 265*  BUN 20 19  CREATININE 1.88* 1.69*  CALCIUM 8.8* 8.2*   PT/INR No results for input(s): LABPROT, INR in the last 72 hours.  Recent Labs  Lab 08/31/17 0441  AST 13*  ALT 13  ALKPHOS 66  BILITOT 0.6  PROT 7.1  ALBUMIN 2.8*     Lipase  No results found for: LIPASE   Medications: . aspirin  81 mg Oral Daily  . cephALEXin  500 mg Oral Q6H  . enoxaparin (LOVENOX) injection  80 mg Subcutaneous Q24H  . insulin aspart  0-20 Units Subcutaneous TID WC  . insulin aspart protamine- aspart  25 Units Subcutaneous BID WC  . losartan  100 mg Oral Daily  . metoprolol succinate  25 mg Oral Daily  . simvastatin  40 mg Oral Daily   . sodium chloride 75 mL/hr at 09/01/17 0137    Assessment/Plan Uncontrolled diabetes hemoglobin A1c 14.5-  Acute on chronic kidney injury stage III Hypotension/dehydration Hyperlipidemia History of gout Body mass index is 45.71 kg/m.  Right axillary abscess Incision and drainage of right axillary abscess in ED, 08/30/2017   FEN: IV fluids/carb modified diet ID: Keflex 500 PO q6h 08/30/2016: Day 3 DVT: Lovenox Follow-up: Primary care    Plan: We can keep some iodoform in that open site for another  day or 2.  Clean the sites with plain soap and water several times a day and redressed.  Antibiotics per primary no further surgical intervention required.  He can follow-up with his primary care.  LOS: 1 day    [place] 09/01/2017 [phone]

## 2017-09-01 NOTE — Discharge Instructions (Signed)
Hyperglycemia Hyperglycemia is when the sugar (glucose) level in your blood is too high. It may not cause symptoms. If you do have symptoms, they may include warning signs, such as:  Feeling more thirsty than normal.  Hunger.  Feeling tired.  Needing to pee (urinate) more than normal.  Blurry eyesight (vision).  You may get other symptoms as it gets worse, such as:  Dry mouth.  Not being hungry (loss of appetite).  Fruity-smelling breath.  Weakness.  Weight gain or loss that is not planned. Weight loss may be fast.  A tingling or numb feeling in your hands or feet.  Headache.  Skin that does not bounce back quickly when it is lightly pinched and released (poor skin turgor).  Pain in your belly (abdomen).  Cuts or bruises that heal slowly.  High blood sugar can happen to people who do or do not have diabetes. High blood sugar can happen slowly or quickly, and it can be an emergency. Follow these instructions at home: General instructions  Take over-the-counter and prescription medicines only as told by your doctor.  Do not use products that contain nicotine or tobacco, such as cigarettes and e-cigarettes. If you need help quitting, ask your doctor.  Limit alcohol intake to no more than 1 drink per day for nonpregnant women and 2 drinks per day for men. One drink equals 12 oz of beer, 5 oz of wine, or 1 oz of hard liquor.  Manage stress. If you need help with this, ask your doctor.  Keep all follow-up visits as told by your doctor. This is important. Eating and drinking  Stay at a healthy weight.  Exercise regularly, as told by your doctor.  Drink enough fluid, especially when you: ? Exercise. ? Get sick. ? Are in hot temperatures.  Eat healthy foods, such as: ? Low-fat (lean) proteins. ? Complex carbs (complex carbohydrates), such as whole wheat bread or brown rice. ? Fresh fruits and vegetables. ? Low-fat dairy products. ? Healthy fats.  Drink  enough fluid to keep your pee (urine) clear or pale yellow. If you have diabetes:  Make sure you know the symptoms of hyperglycemia.  Follow your diabetes management plan, as told by your doctor. Make sure you: ? Take insulin and medicines as told. ? Follow your exercise plan. ? Follow your meal plan. Eat on time. Do not skip meals. ? Check your blood sugar as often as told. Make sure to check before and after exercise. If you exercise longer or in a different way than you normally do, check your blood sugar more often. ? Follow your sick day plan whenever you cannot eat or drink normally. Make this plan ahead of time with your doctor.  Share your diabetes management plan with people in your workplace, school, and household.  Check your urine for ketones when you are ill and as told by your doctor.  Carry a card or wear jewelry that says that you have diabetes. Contact a doctor if:  Your blood sugar level is higher than 240 mg/dL (16.1 mmol/L) for 2 days in a row.  You have problems keeping your blood sugar in your target range.  High blood sugar happens often for you. Get help right away if:  You have trouble breathing.  You have a change in how you think, feel, or act (mental status).  You feel sick to your stomach (nauseous), and that feeling does not go away.  You cannot stop throwing up (vomiting). These symptoms  may be an emergency. Do not wait to see if the symptoms will go away. Get medical help right away. Call your local emergency services (911 in the [place] Do not drive yourself to the hospital. Summary  Hyperglycemia is when the sugar (glucose) level in your blood is too high.  High blood sugar can happen to people who do or do not have diabetes.  Make sure you drink enough fluids, eat healthy foods, and exercise regularly.  Contact your doctor if you have problems keeping your blood sugar in your target range. This information is not intended to replace advice  given to you by your health care provider. Make sure you discuss any questions you have with your health care provider. Document Released: 12/16/2008 Document Revised: 11/06/2015 Document Reviewed: 11/06/2015 Elsevier Interactive Patient Education  2017 [employer] Inc[employer]   Incision and Drainage, Care After Refer to this sheet in the next few weeks. These instructions provide you with information about caring for yourself after your procedure. Your health care provider may also give you more specific instructions. Your treatment has been planned according to current medical practices, but problems sometimes occur. Call your health care provider if you have any problems or questions after your procedure. What can I expect after the procedure? After the procedure, it is common to have:  Pain or discomfort around your incision site.  Drainage from your incision.  Follow these instructions at home:  Take over-the-counter and prescription medicines only as told by your health care provider.  If you were prescribed an antibiotic medicine, take it as told by your health care provider.Do not stop taking the antibiotic even if you start to feel better.  Followinstructions from your health care provider about: ? How to take care of your incision. ? When and how you should change your packing and bandage (dressing). Wash your hands with soap and water before you change your dressing. If soap and water are not available, use hand sanitizer. ? When you should remove your dressing.  Do not take baths, swim, or use a hot tub until your health care provider approves.  Keep all follow-up visits as told by your health care provider. This is important.  Check your incision area every day for signs of infection. Check for: ? More redness, swelling, or pain. ? More fluid or blood. ? Warmth. ? Pus or a bad smell. Contact a health care provider if:  Your cyst or abscess returns.  You have a  fever.  You have more redness, swelling, or pain around your incision.  You have more fluid or blood coming from your incision.  Your incision feels warm to the touch.  You have pus or a bad smell coming from your incision. Get help right away if:  You have severe pain or bleeding.  You cannot eat or drink without vomiting.  You have decreased urine output.  You become short of breath.  You have chest pain.  You cough up blood.  The area where the incision and drainage occurred becomes numb or it tingles. This information is not intended to replace advice given to you by your health care provider. Make sure you discuss any questions you have with your health care provider. Document Released: 05/13/2011 Document Revised: 07/21/2015 Document Reviewed: 12/09/2014 Elsevier Interactive Patient Education  2018 [employer]   Skin Abscess A skin abscess is an infected area on or under your skin that contains pus and other material. An abscess can happen almost anywhere on  your body. Some abscesses break open (rupture) on their own. Most continue to get worse unless they are treated. The infection can spread deeper into the body and into your blood, which can make you feel sick. Treatment usually involves draining the abscess. Follow these instructions at home: Abscess Care  If you have an abscess that has not drained, place a warm, clean, wet washcloth over the abscess several times a day. Do this as told by your doctor.  Follow instructions from your doctor about how to take care of your abscess. Make sure you: ? Cover the abscess with a bandage (dressing). ? Change your bandage or gauze as told by your doctor. ? Wash your hands with soap and water before you change the bandage or gauze. If you cannot use soap and water, use hand sanitizer.  Check your abscess every day for signs that the infection is getting worse. Check for: ? More redness, swelling, or pain. ? More fluid or  blood. ? Warmth. ? More pus or a bad smell. Medicines   Take over-the-counter and prescription medicines only as told by your doctor.  If you were prescribed an antibiotic medicine, take it as told by your doctor. Do not stop taking the antibiotic even if you start to feel better. General instructions  To avoid spreading the infection: ? Do not share personal care items, towels, or hot tubs with others. ? Avoid making skin-to-skin contact with other people.  Keep all follow-up visits as told by your doctor. This is important. Contact a doctor if:  You have more redness, swelling, or pain around your abscess.  You have more fluid or blood coming from your abscess.  Your abscess feels warm when you touch it.  You have more pus or a bad smell coming from your abscess.  You have a fever.  Your muscles ache.  You have chills.  You feel sick. Get help right away if:  You have very bad (severe) pain.  You see red streaks on your skin spreading away from the abscess. This information is not intended to replace advice given to you by your health care provider. Make sure you discuss any questions you have with your health care provider. Document Released: 08/07/2007 Document Revised: 10/15/2015 Document Reviewed: 12/28/2014 Elsevier Interactive Patient Education  2018 [employer]vier Inc[employer] Fingerstick glucose (sugar) goals for home: Before meals: 80-130 mg/dl 2-Hours after meals: less than 180 mg/dl Hemoglobin X1G goal: 7% or less  Avoid beverages with sugar (regular soda, sweet tea, lemonade, fruit juice) and consume mostly water.   Watch portion sizes (especially grains, starchy vegetables, and fruits).   Men should have 60-75 grams of carbohydrates per meal per day.

## 2017-09-01 NOTE — Progress Notes (Addendum)
Inpatient Diabetes Program Recommendations  AACE/ADA: New Consensus Statement on Inpatient Glycemic Control (2015)  Target Ranges:  Prepandial:   less than 140 mg/dL      Peak postprandial:   less than 180 mg/dL (1-2 hours)      Critically ill patients:  140 - 180 mg/dL   Results for JAESON, MOLSTAD (MRN [id number]) as of 09/01/2017 08:25  Ref. Range 08/31/2017 07:16 08/31/2017 12:27 08/31/2017 17:29 08/31/2017 22:32  Glucose-Capillary Latest Ref Range: 70 - 99 mg/dL 227 (H)  7 units NOVOLOG +  25 units 70/30 Insulin 261 (H)  11 units NOVOLOG  199 (H)  4 units NOVOLOG +  25 units 70/30 Insulin  217 (H)   Results for ALEX, LEAHY (MRN [id number]) as of 09/01/2017 08:25  Ref. Range 09/01/2017 07:17  Glucose-Capillary Latest Ref Range: 70 - 99 mg/dL 257 (H)  11 units NOVOLOG +  25 units 70/30 Insulin   Results for EMMAUEL, HALLUMS (MRN [id number]) as of 09/01/2017 08:25  Ref. Range 08/31/2017 04:36  Hemoglobin A1C Latest Ref Range: 4.8 - 5.6 % 14.5 (H)  Average glucose 369 mg/dl    Admit with: Abscess of Axilla/ Glucose 514 mg/dl on admit  History: DM, CKD  Home DM Meds: 70/30 Insulin- 40 units BID  Current Insulin Orders: 70/30 Insulin- 25 units BID      Novolog Resistant Correction Scale/ SSI (0-20 units) TID AC       I&D performed in the ED on 06/29.  CBGs still elevated >200 mg/dl.    MD- Please consider the following in-hospital insulin adjustments:  Increase 70/30 Insulin to 30 units BID (takes 40 units BID at home)   Addendum 10:15am- Met with patient earlier this AM.  Patient stated he usually checks CBGs once daily at home.  CBGs have been as high as 400-500 mg/dl.  Sees Dr. Milas Hock with the [facility] in [place], but stated he would like to start seeing a physician with [facility]--Waiting on a referral from the [place] to go to [place].  Would like to have better CBG control at home.  Patient told me he has been trying to eat smaller portions of  carbohydrate containing foods at home.    Explained what an A1c is and what it measures.  Reminded patient that his goal A1c is 7% or less per ADA standards to prevent both acute and long-term complications.  Explained to patient the extreme importance of good glucose control at home.  Encouraged patient to check his CBGs at least bid at home (before he gives his 70/30 Insulin) and to record all CBGs in a logbook for his/her PCP or Endocrinologist to review.  Also encouraged pt to check additional CBGs from time to time to get extra data for his MD (either before lunch or two-hours post-meals).  Fingerstick glucose (sugar) goals for home:  Reviewed CBG goals for home: Before meals: 80-130 mg/dl and 2-Hours after meals: less than 180 mg/dl.  Also discussed basic DM diet information with patient.  Encouraged patient to avoid beverages with sugar (regular soda, sweet tea, lemonade, fruit juice) and to consume mostly water.  Discussed what foods contain carbohydrates and how carbohydrates affect the body's blood sugar levels.  Encouraged patient to be careful with his portion sizes (especially grains, starchy vegetables, and fruits).  Explained to patient that men should have 60-75 grams of carbohydrates per meal per day.      --Will follow patient during hospitalization--  Wyn Quaker RN, MSN, CDE  Diabetes Coordinator Inpatient Glycemic Control Team Team Pager: [phone] (8a-5p)

## 2017-09-01 NOTE — Discharge Summary (Signed)
Discharge Summary  Parker West:[id number] DOB: [birth year]  PCP: Rodney Langton, MD  Admit date: 08/29/2017 Discharge date: 09/01/2017  Time spent: 25 minutes  Recommendations for Outpatient Follow-up:  1. Take your medications as prescribed 2. Follow-up with your primary care provider  General surgery recommendations:  The patient has an abscess in the right axilla that has been opened and drained. At this point the wound packing will need to be changed daily. Once the patient and family are comfortable doing the dressing change I suspect he could go home with oral abx and follow up in the surgical clinic. The wound needs to be repacked every day until the drainage resolves. He may shower and have the packing replaced after the shower.  Plan: We can keep some iodoform in that open site for another day or 2.  Clean the sites with plain soap and water several times a day and redressed.  Antibiotics per primary no further surgical intervention required.  He can follow-up with his primary care.     Discharge Diagnoses:  Active Hospital Problems   Diagnosis Date Noted  . Abscess of axilla, right 08/30/2017  . CKD stage 3 secondary to diabetes ([place]) 08/30/2017  . DM2 (diabetes mellitus, type 2) ([place])   . Essential hypertension 02/17/2009    Resolved Hospital Problems  No resolved problems to display.    Discharge Condition: Stable  Diet recommendation: Heart healthy diabetic diet  Vitals:   09/01/17 0813 09/01/17 1350  BP: 124/81 122/83  Pulse: 86 86  Resp:  16  Temp:    SpO2:  99%    History of present illness:  Parker E Jacksonis a 47 y.o.malewith medical history significant ofDM2, very poorly controlled, CKD stage 3, HTN. Patient presents to the ED with swelling, pain, drainage from R axilla. Onset 3 weeks ago. Persistent. Hasnt had any treatment or ABx yet for this.  08/30/2017: Patient seen and examined with family members at bedside. Pain is well  controlled on current pain management. Gram stain positive for rare gram-positive cocci in pairs. Started p.o. Keflex 500 mg 4 times daily. Awaiting sensitivities. Negative  08/31/2017: Patient seen and examined at his bedside.  Continues to have drainage from his right axillary abcess.  Pain is well controlled.  General surgery consulted and following.  Started IV antibiotics.  Packing will need to be changed daily.  Patient needs to be educated on packing.  09/01/17: Patient seen and examined at his bedside.  He has no new complaints.  Admits to being comfortable with changing his dressing. Exam on the day of discharge patient was hemodynamically stable.  He will need to follow-up with his primary care provider post hospitalization.  Hospital Course:  Principal Problem:   Abscess of axilla, right Active Problems:   Essential hypertension   DM2 (diabetes mellitus, type 2) (HCC)   CKD stage 3 secondary to diabetes (H[facility])  Right axillary abscess status post I&D on 08/30/2017 MRSA negative Body fluid culture revealed rare gram-positive cocci in pairs, strep agalactae Completed IV cefazolin Start Keflex 500 mg every 6 hours daily x5 days Afebrile Per general surgery wound packing would need to be changed daily Patient and wife educated on dressing changes  Leukocytosis, resolving WBC 12.4 on 09/01/2017 from 13 K Lactic acid 0.8 on 09/01/2017 Afebrile Suspect reactive from recent I&D  Uncontrolled type 2 diabetes Resume 70/30 insulin Continue insulin sliding scale Continue regular Accu-Chek Hemoglobin A1c 14.9 on 08/30/2017  AKI on CKD 3, resolved At baseline  Creatinine 1.69 on 09/01/2017 from 1.88 GFR 54 Creatinine on presentation 2.10 Avoid nephrotoxic agents/hypotension/dehydration  Morbid obesity with BMI 45 Recommend weight loss outpatient Recommend regular exercise Follow-up with PCP outpatient  Hypertension Blood pressures well controlled  Hold losartan and  chlorthalidone due to AKI Continue Toprol-XL Follow up with PCP post hospitalization  Hyperlipidemia Continue Zocor  Gout No sign of acute flare Continue allopurinol    Procedures: I&D on 08/30/2017 in the ED [facility]  Consultations:  General surgery  Discharge Exam: BP 122/83 (BP Location: Left Arm)   Pulse 86   Temp (!) 97.4 F (36.3 C) (Oral)   Resp 16   Ht 6' (1.829 m)   Wt (!) 152.9 kg (337 lb)   SpO2 99%   BMI 45.71 kg/m  . General: 47 y.o. year-old male morbidly obese in no acute distress.  Alert and oriented x3. . Cardiovascular: Regular rate and rhythm with no rubs or gallops.  No thyromegaly or JVD noted.   Marland Kitchen Respiratory: Clear to auscultation with no wheezes or rales. Good inspiratory effort. . Abdomen: Soft nontender nondistended with normal bowel sounds x4 quadrants. . Musculoskeletal: No lower extremity edema. 2/4 pulses in all 4 extremities. . Skin: Right axillary abscess post I&D with mild drainage. Marland Kitchen Psychiatry: Mood is appropriate for condition and setting  Discharge Instructions You were cared for by a hospitalist during your hospital stay. If you have any questions about your discharge medications or the care you received while you were in the hospital after you are discharged, you can call the unit and asked to speak with the hospitalist on call if the hospitalist that took care of you is not available. Once you are discharged, your primary care physician will handle any further medical issues. Please note that NO REFILLS for any discharge medications will be authorized once you are discharged, as it is imperative that you return to your primary care physician (or establish a relationship with a primary care physician if you do not have one) for your aftercare needs so that they can reassess your need for medications and monitor your lab values.   Allergies as of 09/01/2017   No Known Allergies     Medication List    STOP taking these medications     chlorthalidone 100 MG tablet Commonly known as:  HYGROTEN   clonazePAM 0.5 MG tablet Commonly known as:  KLONOPIN   insulin NPH-regular Human (70-30) 100 UNIT/ML injection Commonly known as:  NOVOLIN 70/30 Replaced by:  insulin aspart protamine- aspart (70-30) 100 UNIT/ML injection   losartan 100 MG tablet Commonly known as:  COZAAR   predniSONE 20 MG tablet Commonly known as:  DELTASONE     TAKE these medications   allopurinol 300 MG tablet Commonly known as:  ZYLOPRIM Take 300 mg by mouth daily as needed (gout).   aspirin 81 MG chewable tablet Chew 81 mg by mouth daily.   blood glucose meter kit and supplies Kit Dispense based on patient and insurance preference. Use up to four times daily as directed. (FOR ICD-9 250.00, 250.01).   cephALEXin 500 MG capsule Commonly known as:  KEFLEX Take 1 capsule (500 mg total) by mouth 4 (four) times daily for 5 days.   insulin aspart 100 UNIT/ML injection Commonly known as:  novoLOG Inject 0-20 Units into the skin 3 (three) times daily with meals.   insulin aspart protamine- aspart (70-30) 100 UNIT/ML injection Commonly known as:  NOVOLOG MIX 70/30 Inject 0.25 mLs (25 Units total)  into the skin 2 (two) times daily with a meal. Replaces:  insulin NPH-regular Human (70-30) 100 UNIT/ML injection   insulin starter kit- syringes Misc 1 kit by Other route once.   metoprolol succinate 25 MG 24 hr tablet Commonly known as:  TOPROL-XL Take 1 tablet (25 mg total) by mouth daily. Start taking on:  09/02/2017 What changed:    medication strength  how much to take   simvastatin 40 MG tablet Commonly known as:  ZOCOR Take 40 mg by mouth daily.   traZODone 100 MG tablet Commonly known as:  DESYREL Take 400 mg by mouth at bedtime as needed for sleep.      No Known Allergies Follow-up Information    Rodney Langton, MD. Call in 1 day(s).   Specialty:  Internal Medicine Why:  Please call for an appointment. Contact  information: [place] [address] [phone]            The results of significant diagnostics from this hospitalization (including imaging, microbiology, ancillary and laboratory) are listed below for reference.    Significant Diagnostic Studies: Ct Renal Stone Study  Result Date: 08/06/2017 CLINICAL DATA:  Acute onset of hyperglycemia. Nausea. EXAM: CT ABDOMEN AND PELVIS WITHOUT CONTRAST TECHNIQUE: Multidetector CT imaging of the abdomen and pelvis was performed following the standard protocol without IV contrast. COMPARISON:  Abdominal radiograph performed 05/25/2011 FINDINGS: Lower chest: The visualized lung bases are grossly clear. The visualized portions of the mediastinum are unremarkable. Hepatobiliary: Diffuse fatty infiltration is noted within the liver. The liver is otherwise unremarkable. The gallbladder is within normal limits. The common bile duct remains normal in caliber. Pancreas: The pancreas is within normal limits. Spleen: The spleen is unremarkable in appearance. Adrenals/Urinary Tract: The adrenal glands are unremarkable in appearance. The kidneys are within normal limits. There is no evidence of hydronephrosis. No renal or ureteral stones are identified. No perinephric stranding is seen. Stomach/Bowel: The stomach is unremarkable in appearance. The small bowel is within normal limits. The appendix is normal in caliber, without evidence of appendicitis. The colon is unremarkable in appearance. Vascular/Lymphatic: The abdominal aorta is unremarkable in appearance. The inferior vena cava is grossly unremarkable. No retroperitoneal lymphadenopathy is seen. No pelvic sidewall lymphadenopathy is identified. Reproductive: The bladder is mildly distended and grossly unremarkable in appearance. The prostate remains normal in size. Other: No additional soft tissue abnormalities are seen. Musculoskeletal: No acute osseous abnormalities are identified. The  visualized musculature is unremarkable in appearance. IMPRESSION: 1. No acute abnormality seen within the abdomen or pelvis. 2. Diffuse fatty infiltration within the liver. Electronically Signed   By: Garald Balding M.D.   On: 08/06/2017 23:35    Microbiology: Recent Results (from the past 240 hour(s))  Stat Gram stain     Status: None   Collection Time: 08/30/17  3:00 AM  Result Value Ref Range Status   Specimen Description ABSCESS AXILLA  Final   Gram Stain   Final    RARE WBC PRESENT,BOTH PMN AND MONONUCLEAR RARE GRAM POSITIVE COCCI IN PAIRS RESULT CALLED TO, READ BACK BY AND VERIFIED WITH: S HUERTAS,RN 08/30/17 [place] Performed at [facility], [place] [address], [place], [place] [address]    Report Status 08/30/2017 FINAL  Final  Aerobic Culture (superficial specimen)     Status: None   Collection Time: 08/30/17  3:00 AM  Result Value Ref Range Status   Specimen Description   Final    ABSCESS AXILLA Performed at  [facility], [place] [address], [place], [place] [address]    Special Requests NONE  Final   Culture   Final    RARE GROUP B STREP(S.AGALACTIAE)ISOLATED TESTING AGAINST S. AGALACTIAE NOT ROUTINELY PERFORMED DUE TO PREDICTABILITY OF AMP/PEN/VAN SUSCEPTIBILITY. Performed at [facility], [place] [address], [place], [place] [address]    Report Status 09/01/2017 FINAL  Final  Culture, blood (routine x 2)     Status: None (Preliminary result)   Collection Time: 08/30/17  5:15 AM  Result Value Ref Range Status   Specimen Description   Final    BLOOD LEFT HAND Performed at [facility] [address] [place], [place] [address]    Special Requests   Final    Blood Culture adequate volume BOTTLES DRAWN AEROBIC AND ANAEROBIC Performed at [facility] [address] [place], [place] [address]    Culture   Final    NO GROWTH 2 DAYS Performed at [facility] [address]  [place], [place] [address]    Report Status PENDING  Incomplete  Culture, blood (routine x 2)     Status: None (Preliminary result)   Collection Time: 08/30/17  5:15 AM  Result Value Ref Range Status   Specimen Description   Final    BLOOD RIGHT HAND Performed at [facility] [address] [place], [place] [address]    Special Requests   Final    Blood Culture adequate volume BOTTLES DRAWN AEROBIC AND ANAEROBIC Performed at [facility] [address], [place], [place] [address]    Culture   Final    NO GROWTH 2 DAYS Performed at [facility] [address], [place], [place] [address]    Report Status PENDING  Incomplete  MRSA PCR Screening     Status: None   Collection Time: 08/30/17 10:00 AM  Result Value Ref Range Status   MRSA by PCR NEGATIVE NEGATIVE Final    Comment:        The GeneXpert MRSA Assay (FDA approved for NASAL specimens only), is one component of a comprehensive MRSA colonization surveillance program. It is not intended to diagnose MRSA infection nor to guide or monitor treatment for MRSA infections. Performed at [facility], [place] [address] [place], [place] [address]      Labs: Basic Metabolic Panel: Recent Labs  Lab 08/29/17 2345 08/30/17 0515 08/31/17 0441 09/01/17 0430  NA 133* 132* 138 134*  K 4.0 3.7 4.1 3.9  CL 101 103 106 104  CO2 23 21* 27 23  GLUCOSE 514* 439* 195* 265*  BUN 27* 23* 20 19  CREATININE 2.10* 2.01* 1.88* 1.69*  CALCIUM 9.0 8.5* 8.8* 8.2*   Liver Function Tests: Recent Labs  Lab 08/31/17 0441  AST 13*  ALT 13  ALKPHOS 66  BILITOT 0.6  PROT 7.1  ALBUMIN 2.8*   No results for input(s): LIPASE, AMYLASE in the last 168 hours. No results for input(s): AMMONIA in the last 168 hours. CBC: Recent Labs  Lab 08/29/17 2345 08/30/17 0515 08/31/17 0441 09/01/17 0430  WBC 14.2* 10.8* 13.0* 12.4*  HGB 13.6 12.9* 13.2 12.6*  HCT 40.5 38.4* 39.2 38.8*    MCV 71.4* 71.8* 71.9* 72.7*  PLT 342 263 284 274   Cardiac Enzymes: No results for input(s): CKTOTAL, CKMB, CKMBINDEX, TROPONINI in the last 168 hours. BNP: BNP (last 3 results) No results for input(s): BNP in the last  8760 hours.  ProBNP (last 3 results) No results for input(s): PROBNP in the last 8760 hours.  CBG: Recent Labs  Lab 08/31/17 1227 08/31/17 1729 08/31/17 2232 09/01/17 0717 09/01/17 1242  GLUCAP 261* 199* 217* 257* 175*       Signed:  Kayleen Memos, MD [facility] 09/01/2017, 3:45 PM

## 2017-09-04 LAB — CULTURE, BLOOD (ROUTINE X 2)
CULTURE: NO GROWTH
Culture: NO GROWTH
SPECIAL REQUESTS: ADEQUATE
Special Requests: ADEQUATE

## 2017-09-09 ENCOUNTER — Telehealth: Payer: Self-pay | Admitting: Endocrinology

## 2017-09-09 NOTE — Telephone Encounter (Signed)
-----   Message from Parker West sent at 09/09/2017  3:16 PM EDT ----- Regarding: Hospital Referral Patient Was recently in the hospital. And needs an endocrinologist.  Could you review Patients chart? And see if it is okay to schedule.   Thanks!

## 2017-09-09 NOTE — Telephone Encounter (Signed)
Please refill x 1 Ov is due  

## 2017-09-10 NOTE — Telephone Encounter (Signed)
This appears to be for a referral? I am confused about a refill?

## 2017-09-10 NOTE — Telephone Encounter (Signed)
Sorry about that.  Should say ok to schedule.

## 2017-09-10 NOTE — Telephone Encounter (Signed)
I think this is meant to be sent back to you.

## 2017-11-18 NOTE — Progress Notes (Signed)
Patient ID: Parker West, male   DOB: 1970/08/23, 47 y.o.   MRN: [id number]           Reason for Appointment: Consultation for Type 2 Diabetes  Referring physician: Wymer   History of Present Illness:          Date of diagnosis of type 2 diabetes mellitus: 2016       Background history:  He was admitted to the hospital in 2016 with marked hyperglycemia and acute onset of increased thirst and weakness His blood sugar was over 700 He was discharged on Lantus and Humalog However subsequently with his treatment being coordinated by the [facility] he was switched to 70/30 insulin which he is still taking   Recent history:   Most recent A1c is 14.5  INSULIN regimen is:  Novolin 70/20 with a pen, 40 units a.m. -25 and p.m. Pc      Non-insulin hypoglycemic drugs the patient is taking are: Metformin 500 qd  Current management, blood sugar patterns and problems identified:  He has been continued on insulin regimen since diagnosis  Appears to have had persistently poor control records from the [facility] are not available, reportedly seeing an endocrinologist there  At some point he was given metformin also in addition to insulin but with 500 mg twice daily dose he gets diarrhea and is now taking this once a day in the morning  He had been recommended 40 units of insulin twice a day but since he was at times having blood sugars as low as 80 in the evening he has been taking mostly 25 units at suppertime  He has had difficulty getting readings from his meter for the last month and has not monitored glucose or obtain the new meter  However he thinks his morning sugars may have been about 200 previously  He says he takes his insulin about 1 to 2 hours after eating both in the morning and evening  Although he has been seen by dietitian at [facility] periodically and probably again last month he is not able to watch his diet well  He says that he is frequently  eating fried food because of needing to eat out as well as mostly eating cereal in the morning  Has gained significant amount of weight this summer, probably about 25 pounds  Currently not exercising since he had an episode of gout in his foot          Side effects from medications have been: Diarrhea from 1000 mg metformin  Compliance with the medical regimen: Inconsistent     Meal times are:  Breakfast is at 8 AM lunch: 1-2 PM dinner: 6-7 p.m.  Typical meal intake: Breakfast is   usually cereal.  Lunch may be cheeseburger or fried food              Exercise: none   Glucose monitoring:  done 0 times a day         Glucometer: ?      Blood Glucose readings not available    Dietician visit, most recent: 7/19  Weight history:   Wt Readings from Last 3 Encounters:  11/19/17 (!) 356 lb 12.8 oz (161.8 kg)  08/29/17 (!) 337 lb (152.9 kg)  09/01/16 (!) 335 lb (152 kg)    Glycemic control:   Lab Results  Component Value Date   HGBA1C 14.5 (H) 08/31/2017   HGBA1C 14.9 (H) 08/30/2017   HGBA1C 13.2 (H) 10/31/2014  Lab Results  Component Value Date   LDLCALC 116 (H) 04/21/2009   CREATININE 1.69 (H) 09/01/2017   No results found for: MICRALBCREAT  No results found for: FRUCTOSAMINE  Office Visit on 11/19/2017  Component Date Value Ref Range Status  . POC Glucose 11/19/2017 180* 70 - 99 mg/dl Final    Allergies as of 11/19/2017   No Known Allergies     Medication List        Accurate as of 11/19/17  1:49 PM. Always use your most recent med list.          allopurinol 300 MG tablet Commonly known as:  ZYLOPRIM Take 300 mg by mouth daily as needed (gout).   amLODipine 5 MG tablet Commonly known as:  NORVASC Take 1 tablet (5 mg total) by mouth daily.   aspirin 81 MG chewable tablet Chew 81 mg by mouth daily.   blood glucose meter kit and supplies Kit Dispense based on patient and insurance preference. Use up to four times daily as directed. (FOR ICD-9 250.00,  250.01).   glucose blood test strip Use as instructed to check blood sugar 3 times a day   insulin aspart protamine- aspart (70-30) 100 UNIT/ML injection Commonly known as:  NOVOLOG MIX 70/30 Inject 0.25 mLs (25 Units total) into the skin 2 (two) times daily with a meal.   insulin starter kit- syringes Misc 1 kit by Other route once.   losartan 100 MG tablet Commonly known as:  COZAAR Take 100 mg by mouth daily.   metoprolol succinate 25 MG 24 hr tablet Commonly known as:  TOPROL-XL Take 1 tablet (25 mg total) by mouth daily.   Semaglutide(0.25 or 0.5MG/DOS) 2 MG/1.5ML Sopn Inject 0.5 mg into the skin once a week.   simvastatin 40 MG tablet Commonly known as:  ZOCOR Take 40 mg by mouth daily.   traZODone 100 MG tablet Commonly known as:  DESYREL Take 400 mg by mouth at bedtime as needed for sleep.       Allergies: No Known Allergies  Past Medical History:  Diagnosis Date  . Anginal pain ([place])   . Diabetes mellitus without complication ([place])   . Dyspnea 10/09/2011   At rest  Recent DVT   . Gout   . Heart murmur    "when I was small"  . Hematochezia 10/09/2011   Low grade antedated DVT  . Hyperlipidemia   . Hypertension   . Microcytic normochromic anemia 10/09/2011   retic 1.6% Hb 9.9 10/09/11  . Obesity   . Pneumonia 1988   "walking"  . Staph infection 09/04/11   BLE/wife's report    Past Surgical History:  Procedure Laterality Date  . EYE SURGERY     stye excision  . REDUCTION MAMMAPLASTY  1990's    Family History  Problem Relation Age of Onset  . Stroke Mother   . Hypertension Mother   . Diabetes Mother   . Breast cancer Mother   . Hypertension Father   . Ulcers Father        peptic  . Diabetes Father   . Colon cancer Maternal Aunt     Social History:  reports that he has never smoked. He has never used smokeless tobacco. He reports that he drinks alcohol. He reports that he does not use drugs.   Review of Systems  Constitutional: Positive for  weight gain.  HENT: Negative for headaches.   Eyes: Negative for blurred vision.  Respiratory: Negative for shortness of breath.  Cardiovascular: Negative for palpitations.  Gastrointestinal: Negative for diarrhea.  Endocrine: Positive for fatigue and erectile dysfunction.  Genitourinary: Positive for frequency.  Musculoskeletal: Negative for back pain.  Skin: Negative for rash.  Neurological: Negative for numbness and tingling.  Psychiatric/Behavioral: Positive for nervousness and insomnia.     Lipid history: Has been prescribed Zocor but recently has not been taking it    Lab Results  Component Value Date   CHOL 194 04/21/2009   HDL 38.90 (L) 04/21/2009   LDLCALC 116 (H) 04/21/2009   TRIG 197.0 (H) 04/21/2009   CHOLHDL 5 04/21/2009           Hypertension: Has been present since about 2011  Occasionally monitors blood pressure at home and he thinks diastolic is in the 70J and 90s, last reading 88  BP Readings from Last 3 Encounters:  11/19/17 (!) 152/100  09/01/17 122/83  08/07/17 105/74    Most recent eye exam was in 2017  Most recent foot exam: 11/2017  Currently known complications of diabetes:  LABS:  Office Visit on 11/19/2017  Component Date Value Ref Range Status  . POC Glucose 11/19/2017 180* 70 - 99 mg/dl Final    Physical Examination:  BP (!) 152/100 (BP Location: Left Arm, Patient Position: Sitting, Cuff Size: Large)   Pulse 88   Resp 20   Ht 6' (1.829 m)   Wt (!) 356 lb 12.8 oz (161.8 kg)   SpO2 97%   BMI 48.39 kg/m   GENERAL:         Patient has marked generalized obesity.    HEENT:         Eye exam shows normal external appearance.  Fundus exam shows no retinopathy.  Oral exam shows normal mucosa .   NECK:   There is no lymphadenopathy  Thyroid is not enlarged and no nodules felt.   Carotids are normal to palpation and no bruit heard  LUNGS:         Chest is symmetrical. Lungs are clear to auscultation.Marland Kitchen   HEART:         Heart  sounds:  S1 and S2 are normal. No murmur or click heard., no S3 or S4.   ABDOMEN:   There is no distention present. Liver and spleen are not palpable.  No other mass or tenderness present.    NEUROLOGICAL:   Ankle jerks are absent bilaterally.    Diabetic Foot Exam - Simple   Simple Foot Form Diabetic Foot exam was performed with the following findings:  Yes   Visual Inspection No deformities, no ulcerations, no other skin breakdown bilaterally:  Yes Sensation Testing Intact to touch and monofilament testing bilaterally:  Yes Pulse Check Posterior Tibialis and Dorsalis pulse intact bilaterally:  Yes Comments            Vibration sense is mildly reduced in distal first toes.  MUSCULOSKELETAL:  There is no swelling or deformity of the peripheral joints.     EXTREMITIES:     There is no ankle edema.  SKIN:       No rash or lesions of concern.        ASSESSMENT:  Diabetes type 2, insulin requiring with morbid obesity  See history of present illness for detailed discussion of current diabetes management, blood sugar patterns and problems identified   He has had marked hyperglycemia and persistently poor control since diagnosis in 2016 For his weight of 162 kg he is taking only 65 units of insulin a  day along with only 500 mg of metformin Currently not monitoring his blood sugars Also not following any diet with usually a poor meal plan despite reportedly having instruction from the dietitian Recently not exercising and gaining weight also  Currently taking his INSULIN inappropriately postprandially about an hour or 2 later Most likely will also need an increased dose of insulin but did not have any blood sugar patterns at this time make recommendations  Complications of diabetes: May have nephropathy from this, mild erectile dysfunction  CHRONIC kidney disease: He has had impaired renal function although not clear if this is consistently abnormal when he is not hospitalized Will  need follow-up renal function is done today  History of gout, managed by the [facility] and on allopurinol  HYPERTENSION: This is poorly controlled and is taking only losartan  History of PTSD  PLAN:    Check fructosamine to establish level of control now  We will add a GLP-1 drug to his regimen of insulin Discussed with the patient the nature of GLP-1 drugs, the action on various organ systems, improved satiety, how they benefit blood glucose control, as well as the benefit of weight loss.  Explained possible side effects of OZEMPIC, most commonly nausea that usually improves over time; discussed safety information in package insert.  Demonstrated the medication injection device, dosage measurement and injection technique to the patient.  Showed patient possible injection sites To start with 0.25 mg dosage weekly for the first 4 injections and then 0.5 mg after the fourth dose if tolerated Patient brochure on Ozempic with enclosed co-pay card given  INSULIN: He would like to start taking this before eating at least 30 minutes  Since his is unclear what his blood sugar patterns are able to stay on the same doses of 40 units before breakfast and 25 before suppertime of the 70/30  However discussed that if his blood sugar patterns indicate inconsistent or inappropriate control with the premixed insulin will need to switch him to 2 separate insulins for basal and bolus action  Stop metformin as this is unlikely to be helpful with 500 mg and he has renal dysfunction also  New Accu-Chek guide meter given  He will start checking blood sugars at least twice a day at different times by rotation including fasting or 2 hours after eating  Start regular walking and exercise program  Consultation with DIETITIAN for meal planning, discussed need to avoid high fat and high glycemic index foods  For his uncontrolled hypertension he will start taking amlodipine 5 mg daily  Lipid  management: Currently not on any medication but supposed to start simvastatin, recommend that he take only 20 mg daily since he is starting amlodipine and will need follow-up lipids on the next visit   Patient Instructions  Check blood sugars on waking up at least 4 times a week  Also check blood sugars about 2 hours after a meal and do this after different meals by rotation  Recommended blood sugar levels on waking up is 90-130 and about 2 hours after meal is 130-160  Please bring your blood sugar monitor to each visit, thank you  Start OZEMPIC 0.'25mg'$  using the pen as shown once weekly on the same day of the week.   You may inject in the stomach, thigh or arm as indicated in the brochure given. If you have any difficulties using the pen see the video at CompPlans.co.za  You will feel fullness of the stomach with starting the medication  and should try to keep the portions at meals small.  You may experience nausea in the first few days which usually gets better over time    If you have any questions or concerns please call the office   You may also talk to a nurse educator with [employer] at [phone] Useful website: [email or web address].[email or web address]   INSULIN: Take 40 units in the morning about 30 minutes before breakfast Take 25 units about 30 minutes before suppertime If the blood sugars are below 100, call to reduce the insulin  Stop taking metformin  Reduce simvastatin to half a tablet Start amlodipine in the morning for blood pressure and check blood pressure consistently  Schedule eye exam  Start regular walking or other exercise, need 30 minutes of aerobic exercise at least 4 days a week Have a protein with breakfast daily     Counseling time on subjects discussed in assessment and plan sections is over 50% of today's 60 minute visit  Consultation note has been sent to the referring physician  Elayne Snare 11/19/2017, 1:49 PM   Note: This office note was prepared  with Dragon voice recognition system technology. Any transcriptional errors that result from this process are unintentional.

## 2017-11-19 ENCOUNTER — Other Ambulatory Visit: Payer: Self-pay | Admitting: Endocrinology

## 2017-11-19 ENCOUNTER — Encounter: Payer: Self-pay | Admitting: Endocrinology

## 2017-11-19 ENCOUNTER — Ambulatory Visit (INDEPENDENT_AMBULATORY_CARE_PROVIDER_SITE_OTHER): Payer: 59 | Admitting: Endocrinology

## 2017-11-19 VITALS — BP 152/100 | HR 88 | Resp 20 | Ht 72.0 in | Wt 356.8 lb

## 2017-11-19 DIAGNOSIS — E1165 Type 2 diabetes mellitus with hyperglycemia: Secondary | ICD-10-CM

## 2017-11-19 DIAGNOSIS — E7800 Pure hypercholesterolemia, unspecified: Secondary | ICD-10-CM

## 2017-11-19 DIAGNOSIS — I10 Essential (primary) hypertension: Secondary | ICD-10-CM | POA: Diagnosis not present

## 2017-11-19 LAB — URINALYSIS, ROUTINE W REFLEX MICROSCOPIC
Bilirubin Urine: NEGATIVE
Hgb urine dipstick: NEGATIVE
KETONES UR: NEGATIVE
LEUKOCYTES UA: NEGATIVE
Nitrite: NEGATIVE
PH: 6 (ref 5.0–8.0)
SPECIFIC GRAVITY, URINE: 1.02 (ref 1.000–1.030)
TOTAL PROTEIN, URINE-UPE24: 100 — AB
URINE GLUCOSE: NEGATIVE
UROBILINOGEN UA: 0.2 (ref 0.0–1.0)

## 2017-11-19 LAB — COMPREHENSIVE METABOLIC PANEL
ALBUMIN: 3.5 g/dL (ref 3.5–5.2)
ALT: 9 U/L (ref 0–53)
AST: 10 U/L (ref 0–37)
Alkaline Phosphatase: 68 U/L (ref 39–117)
BILIRUBIN TOTAL: 0.3 mg/dL (ref 0.2–1.2)
BUN: 16 mg/dL (ref 6–23)
CO2: 29 mEq/L (ref 19–32)
CREATININE: 1.6 mg/dL — AB (ref 0.40–1.50)
Calcium: 9 mg/dL (ref 8.4–10.5)
Chloride: 106 mEq/L (ref 96–112)
GFR: 59.62 mL/min — ABNORMAL LOW (ref >60.00)
Glucose, Bld: 145 mg/dL — ABNORMAL HIGH (ref 70–99)
Potassium: 3.5 mEq/L (ref 3.5–5.1)
SODIUM: 139 meq/L (ref 135–145)
Total Protein: 7.7 g/dL (ref 6.0–8.3)

## 2017-11-19 LAB — MICROALBUMIN / CREATININE URINE RATIO
Creatinine,U: 104.8 mg/dL
Microalb Creat Ratio: 79.9 mg/g — ABNORMAL HIGH (ref 0.0–30.0)
Microalb, Ur: 83.8 mg/dL — ABNORMAL HIGH (ref 0.0–1.9)

## 2017-11-19 LAB — GLUCOSE, POCT (MANUAL RESULT ENTRY): POC GLUCOSE: 180 mg/dL — AB (ref 70–99)

## 2017-11-19 MED ORDER — AMLODIPINE BESYLATE 5 MG PO TABS: 5.0000 mg | ORAL_TABLET | Freq: Every day | ORAL | 3 refills | Status: DC

## 2017-11-19 MED ORDER — SEMAGLUTIDE(0.25 OR 0.5MG/DOS) 2 MG/1.5ML ~~LOC~~ SOPN: 0.5000 mg | PEN_INJECTOR | SUBCUTANEOUS | 2 refills | Status: DC

## 2017-11-19 MED ORDER — GLUCOSE BLOOD VI STRP
ORAL_STRIP | 12 refills | Status: DC
Start: 2017-11-19 — End: 2018-04-01

## 2017-11-19 NOTE — Telephone Encounter (Signed)
AK-Pt insurance does not pay for Accu-Check but it will cover One Touch/can this be changed? Plz advise/thx dmf

## 2017-11-19 NOTE — Patient Instructions (Signed)
Check blood sugars on waking up at least 4 times a week  Also check blood sugars about 2 hours after a meal and do this after different meals by rotation  Recommended blood sugar levels on waking up is 90-130 and about 2 hours after meal is 130-160  Please bring your blood sugar monitor to each visit, thank you  Start OZEMPIC 0.25mg  using the pen as shown once weekly on the same day of the week.   You may inject in the stomach, thigh or arm as indicated in the brochure given. If you have any difficulties using the pen see the video at FarmerBuys.[email or web address].com  You will feel fullness of the stomach with starting the medication and should try to keep the portions at meals small.  You may experience nausea in the first few days which usually gets better over time    If you have any questions or concerns please call the office   You may also talk to a nurse educator with [employer] at [phone] Useful website: [email or web address]   INSULIN: Take 40 units in the morning about 30 minutes before breakfast Take 25 units about 30 minutes before suppertime If the blood sugars are below 100, call to reduce the insulin  Stop taking metformin  Reduce simvastatin to half a tablet Start amlodipine in the morning for blood pressure and check blood pressure consistently  Schedule eye exam  Start regular walking or other exercise, need 30 minutes of aerobic exercise at least 4 days a week Have a protein with breakfast daily

## 2017-11-20 LAB — FRUCTOSAMINE: Fructosamine: 214 umol/L (ref 0–285)

## 2017-11-24 MED ORDER — ONETOUCH ULTRASOFT LANCETS MISC: 12 refills | Status: DC

## 2017-11-24 MED ORDER — ONETOUCH ULTRA 2 W/DEVICE KIT: 1.0000 | PACK | Freq: Once | 0 refills | Status: AC

## 2017-11-24 MED ORDER — GLUCOSE BLOOD VI STRP: ORAL_STRIP | 12 refills | Status: DC

## 2017-12-16 ENCOUNTER — Institutional Professional Consult (permissible substitution): Payer: Medicare Other | Admitting: Internal Medicine

## 2018-01-08 ENCOUNTER — Ambulatory Visit: Payer: Medicare Other | Admitting: Dietician

## 2018-02-12 ENCOUNTER — Ambulatory Visit: Payer: Medicare Other | Admitting: Dietician

## 2018-03-09 ENCOUNTER — Other Ambulatory Visit: Payer: Self-pay

## 2018-03-09 ENCOUNTER — Telehealth: Payer: Self-pay | Admitting: Internal Medicine

## 2018-03-09 DIAGNOSIS — E1165 Type 2 diabetes mellitus with hyperglycemia: Secondary | ICD-10-CM

## 2018-03-09 MED ORDER — SEMAGLUTIDE(0.25 OR 0.5MG/DOS) 2 MG/1.5ML ~~LOC~~ SOPN: 0.5000 mg | PEN_INJECTOR | SUBCUTANEOUS | 2 refills | Status: DC

## 2018-03-09 NOTE — Telephone Encounter (Signed)
MEDICATION: Ozempic   PHARMACY:  [id number] - [place], [place] - [address]  IS THIS A 90 DAY SUPPLY : 30 day  IS PATIENT OUT OF MEDICATION:  IF NOT; HOW MUCH IS LEFT:   LAST APPOINTMENT DATE: @10 /15/2019  NEXT APPOINTMENT DATE:@1 /09/2018  DO WE HAVE YOUR PERMISSION TO LEAVE A DETAILED MESSAGE:  OTHER COMMENTS:    **Let patient know to contact pharmacy at the end of the day to make sure medication is ready. **  ** Please notify patient to allow 48-72 hours to process**  **Encourage patient to contact the pharmacy for refills or they can request refills through [facility]**

## 2018-03-10 ENCOUNTER — Ambulatory Visit (INDEPENDENT_AMBULATORY_CARE_PROVIDER_SITE_OTHER): Payer: No Typology Code available for payment source | Admitting: Internal Medicine

## 2018-03-10 ENCOUNTER — Encounter: Payer: Self-pay | Admitting: Internal Medicine

## 2018-03-10 VITALS — BP 142/82 | HR 110 | Ht 72.0 in | Wt 345.0 lb

## 2018-03-10 DIAGNOSIS — Z794 Long term (current) use of insulin: Secondary | ICD-10-CM

## 2018-03-10 DIAGNOSIS — Z9114 Patient's other noncompliance with medication regimen: Secondary | ICD-10-CM

## 2018-03-10 DIAGNOSIS — E1165 Type 2 diabetes mellitus with hyperglycemia: Secondary | ICD-10-CM

## 2018-03-10 LAB — POCT GLYCOSYLATED HEMOGLOBIN (HGB A1C): Hemoglobin A1C: 6 % — AB (ref 4.0–5.6)

## 2018-03-10 MED ORDER — INSULIN ASPART PROT & ASPART (70-30 MIX) 100 UNIT/ML ~~LOC~~ SUSP: SUBCUTANEOUS | 0 refills | Status: DC

## 2018-03-10 NOTE — Patient Instructions (Addendum)
-   Decrease Novolog 70/30 to 25 units with Breakfast and 20 units with Supper - Continue Ozempic at 0.5 mg weekly  - Check sugars twice a day (Before Breakfast and Supper ) - Take Novolin 70/30 BEFORE your meal and NOT after   -HOW TO TREAT LOW BLOOD SUGARS (Blood sugar LESS THAN 70 MG/DL)  Please follow the RULE OF 15 for the treatment of hypoglycemia treatment (when your (blood sugars are less than 70 mg/dL)    STEP 1: Take 15 grams of carbohydrates when your blood sugar is low, which includes:   3-4 GLUCOSE TABS  OR  3-4 OZ OF JUICE OR REGULAR SODA OR  ONE TUBE OF GLUCOSE GEL     STEP 2: RECHECK blood sugar in 15 MINUTES STEP 3: If your blood sugar is still low at the 15 minute recheck --> then, go back to STEP 1 and treat AGAIN with another 15 grams of carbohydrates.

## 2018-03-10 NOTE — Progress Notes (Signed)
Name: Parker West  Age/ Sex: 48 y.o., male   MRN/ DOB: [id number], 06/28/1970     PCP: Rodney Langton, MD   Reason for Endocrinology Evaluation: Type 2 Diabetes Mellitus  Initial Endocrine Consultative Visit: 11/2017    PATIENT IDENTIFIER: [name] is a 48 y.o. male with a past medical history of  T2DM, HTN, CKD and dyslipidemia . The patient has followed with Endocrinology clinic since 11/2017 for consultative assistance with management of his diabetes.  DIABETIC HISTORY:  Parker West was diagnosed with T2DM in 2016 , he was admitted to the hospital with BG in 700's mg/dL. He was discharged on Lantus and Humalog which was subsequently switched to Novolog  70/30 through the [place]   . His hemoglobin A1c has ranged from 13.2%  in 2016, peaking at 14.9% in 2016.  Intolerant to Metformin   SUBJECTIVE:   During the last visit (11/19/17): A1c was 14.5% . Ozempic was started. Novolog 70/30 40 units QAM,and 25 units QPM.    Today (03/10/2018): Parker West is here for a follow up on his diabetes management. He checks his blood sugars 2 times daily, before he eats and again during the day. The patient has had hypoglycemic episodes since the last clinic visit, which was 2 weeks ago, with a BG of 28 mg/dL during the day. The patient is symptomatic with these episodes, with symptoms of shaking. Otherwise, the patient has not required any recent emergency interventions for hypoglycemia and has not had recent hospitalizations secondary to hyper or hypoglycemic episodes.   Pt has been taking Novolog mix in the morning, sometimes before and at times after his meal by 30-60 minutes later. He has been skipping his evening dose as his BG;s have been "good"   ROS: As per HPI and as detailed below: Review of Systems  Respiratory: Negative for cough and shortness of breath.   Cardiovascular: Negative for chest pain  and palpitations.  Gastrointestinal: Negative for diarrhea and nausea.  Genitourinary: Negative for frequency.  Endo/Heme/Allergies: Negative for polydipsia.      HOME DIABETES REGIMEN:  Novolog 70/30  40 units QAM  Novolog 70/30  25 units QPM- NOt taking  Ozempic  0.5 mg weekly     METER DOWNLOAD SUMMARY: Did not bring      HISTORY:  Past Medical History:  Past Medical History:  Diagnosis Date  . Anginal pain ([place])   . Diabetes mellitus without complication ([place])   . Dyspnea 10/09/2011   At rest  Recent DVT   . Gout   . Heart murmur    "when I was small"  . Hematochezia 10/09/2011   Low grade antedated DVT  . Hyperlipidemia   . Hypertension   . Microcytic normochromic anemia 10/09/2011   retic 1.6% Hb 9.9 10/09/11  . Obesity   . Pneumonia 1988   "walking"  . Staph infection 09/04/11   BLE/wife's report   Past Surgical History:  Past Surgical History:  Procedure Laterality Date  . EYE SURGERY     stye excision  . REDUCTION MAMMAPLASTY  1990's    Social History:  reports that he has never smoked. He has never used smokeless tobacco. He reports current alcohol use. He reports that he does not use drugs. Family History:  Family History  Problem Relation Age of Onset  . Stroke Mother   . Hypertension Mother   . Diabetes Mother   . Breast cancer Mother   . Hypertension Father   .  Ulcers Father        peptic  . Diabetes Father   . Colon cancer Maternal Aunt      HOME MEDICATIONS: Allergies as of 03/10/2018   No Known Allergies     Medication List       Accurate as of March 10, 2018  3:18 PM. Always use your most recent med list.        allopurinol 300 MG tablet Commonly known as:  ZYLOPRIM Take 300 mg by mouth daily as needed (gout).   amLODipine 5 MG tablet Commonly known as:  NORVASC Take 1 tablet (5 mg total) by mouth daily.   aspirin 81 MG chewable tablet Chew 81 mg by mouth daily.   blood glucose meter kit and supplies Kit Dispense based  on patient and insurance preference. Use up to four times daily as directed. (FOR ICD-9 250.00, 250.01).   glucose blood test strip Commonly known as:  ACCU-CHEK GUIDE Use as instructed to check blood sugar 3 times a day   glucose blood test strip Commonly known as:  ONE TOUCH ULTRA TEST Use as instructed to check blood sugar 3 times a day   insulin aspart protamine- aspart (70-30) 100 UNIT/ML injection Commonly known as:  NOVOLOG MIX 70/30 Inject 0.25 mLs (25 Units total) into the skin daily with breakfast AND 0.2 mLs (20 Units total) daily with supper.   insulin starter kit- syringes Misc 1 kit by Other route once.   losartan 100 MG tablet Commonly known as:  COZAAR Take 100 mg by mouth daily.   metoprolol succinate 25 MG 24 hr tablet Commonly known as:  TOPROL-XL Take 1 tablet (25 mg total) by mouth daily.   onetouch ultrasoft lancets Use as instructed to check blood sugar 3 times a day   Semaglutide(0.25 or 0.5MG/DOS) 2 MG/1.5ML Sopn Commonly known as:  OZEMPIC (0.25 OR 0.5 MG/DOSE) Inject 0.5 mg into the skin once a week.   simvastatin 40 MG tablet Commonly known as:  ZOCOR Take 40 mg by mouth daily.   traZODone 100 MG tablet Commonly known as:  DESYREL Take 400 mg by mouth at bedtime as needed for sleep.        OBJECTIVE:   Vital Signs: BP (!) 142/82 (BP Location: Right Arm, Patient Position: Sitting, Cuff Size: Large)   Pulse (!) 110   Ht 6' (1.829 m)   Wt (!) 345 lb (156.5 kg)   SpO2 98%   BMI 46.79 kg/m   Wt Readings from Last 3 Encounters:  03/10/18 (!) 345 lb (156.5 kg)  11/19/17 (!) 356 lb 12.8 oz (161.8 kg)  08/29/17 (!) 337 lb (152.9 kg)     Exam: General: Pt appears well and is in NAD  Hydration: Well-hydrated with moist mucous membranes and good skin turgor  Lungs: Clear with good BS bilat with no rales, rhonchi, or wheezes  Heart: RRR with normal S1 and S2 and no gallops; no murmurs; no rub  Abdomen: Normoactive bowel sounds, soft,  nontender, without masses or organomegaly palpable  Extremities: No pretibial edema. No tremor.   Neuro: MS is good with appropriate affect, pt is alert and Ox3     DATA REVIEWED:  Lab Results  Component Value Date   HGBA1C 6.0 (A) 03/10/2018   HGBA1C 14.5 (H) 08/31/2017   HGBA1C 14.9 (H) 08/30/2017   Lab Results  Component Value Date   MICROALBUR 83.8 (H) 11/19/2017   LDLCALC 116 (H) 04/21/2009   CREATININE 1.60 (H) 11/19/2017  Lab Results  Component Value Date   MICRALBCREAT 79.9 (H) 11/19/2017    ASSESSMENT / PLAN / RECOMMENDATIONS:   1) Type 2 Diabetes Mellitus, Historically Poorly controlled - Most recent A1c of 6.0 %. Goal A1c < 7.0%.   Plan: - His A1c is low at 6.0% which may seem this is a good control but in someone who is on multiple daily doses of insulin , I am concerned about recurrent hypoglycemia.  - Unfortunately, he continues to have take his insulin inappropriately, 30-60 minutes after eating. This was discussed with his on last visit but he continues to use it postprandial. - Discussed pharmacokinetics of basal/bolus insulin and the importance of taking prandial insulin with meals.  We also discussed avoiding sugar-sweetened beverages and snacks, when possible.  - He again presents today with no sugar data, I explained to him the importance of having glucose data to make appropriate adjustments to his insulin regimen.  - We discussed that hypoglycemia can be fatal.  - His regimen will be changes as below, based on his A1c   MEDICATIONS:  Decrease Novolog to 25 units WITH Breakfast   Decrease Novolog to 20 units WITH Supper   Continue Ozempic 0.5 mg weekly   EDUCATION / INSTRUCTIONS:  BG monitoring instructions: Patient is instructed to check his blood sugars 2 times a day, before Breakfast and Supper .  Call [facility] if: BG persistently < 70 or > 300. . I reviewed the Rule of 15 for the treatment of hypoglycemia in detail with  the patient. Literature supplied.    F/U in 3 weeks with glucose data    Signed electronically by: Mack Guise, MD  [facility]  [facility] [address] [place], [place] [address] Phone: [phone] FAX: [phone]   CC: Rodney Langton, [place] [address] Phone: [phone]  Fax: [phone]  Return to Endocrinology clinic as below: Future Appointments  Date Time Provider [place]  03/11/2018 10:30 AM Christella Hartigan, RD [place] N[facility]  03/26/2018 10:45 AM [facility] None  04/01/2018 10:45 AM Elayne Snare, MD [facility] None

## 2018-03-11 ENCOUNTER — Ambulatory Visit: Payer: Medicare Other | Admitting: Registered"

## 2018-03-24 ENCOUNTER — Institutional Professional Consult (permissible substitution): Payer: Medicare Other | Admitting: Internal Medicine

## 2018-03-26 ENCOUNTER — Other Ambulatory Visit: Payer: Medicare Other

## 2018-03-26 ENCOUNTER — Ambulatory Visit: Payer: Medicare Other | Admitting: Registered"

## 2018-03-30 ENCOUNTER — Other Ambulatory Visit (INDEPENDENT_AMBULATORY_CARE_PROVIDER_SITE_OTHER): Payer: Medicare Other

## 2018-03-30 DIAGNOSIS — E1165 Type 2 diabetes mellitus with hyperglycemia: Secondary | ICD-10-CM

## 2018-03-30 LAB — LIPID PANEL
CHOL/HDL RATIO: 5
Cholesterol: 153 mg/dL (ref 0–200)
HDL: 27.9 mg/dL — AB (ref >39.00)
LDL CALC: 89 mg/dL (ref 0–99)
NONHDL: 125.31
Triglycerides: 180 mg/dL — ABNORMAL HIGH (ref 0.0–149.0)
VLDL: 36 mg/dL (ref 0.0–40.0)

## 2018-03-30 LAB — COMPREHENSIVE METABOLIC PANEL
ALK PHOS: 70 U/L (ref 39–117)
ALT: 10 U/L (ref 0–53)
AST: 11 U/L (ref 0–37)
Albumin: 3.6 g/dL (ref 3.5–5.2)
BUN: 24 mg/dL — ABNORMAL HIGH (ref 6–23)
CO2: 26 mEq/L (ref 19–32)
Calcium: 9.2 mg/dL (ref 8.4–10.5)
Chloride: 100 mEq/L (ref 96–112)
Creatinine, Ser: 1.96 mg/dL — ABNORMAL HIGH (ref 0.40–1.50)
GFR: 44.31 mL/min — AB (ref >60.00)
GLUCOSE: 167 mg/dL — AB (ref 70–99)
POTASSIUM: 3.3 meq/L — AB (ref 3.5–5.1)
Sodium: 136 mEq/L (ref 135–145)
Total Bilirubin: 0.5 mg/dL (ref 0.2–1.2)
Total Protein: 7.7 g/dL (ref 6.0–8.3)

## 2018-03-30 LAB — HEMOGLOBIN A1C: HEMOGLOBIN A1C: 6.4 % (ref 4.6–6.5)

## 2018-04-01 ENCOUNTER — Ambulatory Visit (INDEPENDENT_AMBULATORY_CARE_PROVIDER_SITE_OTHER): Payer: No Typology Code available for payment source | Admitting: Endocrinology

## 2018-04-01 ENCOUNTER — Encounter: Payer: Self-pay | Admitting: Endocrinology

## 2018-04-01 VITALS — BP 118/70 | HR 130 | Ht 72.0 in | Wt 347.6 lb

## 2018-04-01 DIAGNOSIS — I10 Essential (primary) hypertension: Secondary | ICD-10-CM

## 2018-04-01 DIAGNOSIS — E7800 Pure hypercholesterolemia, unspecified: Secondary | ICD-10-CM

## 2018-04-01 DIAGNOSIS — E1121 Type 2 diabetes mellitus with diabetic nephropathy: Secondary | ICD-10-CM

## 2018-04-01 DIAGNOSIS — N289 Disorder of kidney and ureter, unspecified: Secondary | ICD-10-CM

## 2018-04-01 MED ORDER — INSULIN ASPART PROT & ASPART (70-30 MIX) 100 UNIT/ML PEN: 15.0000 [IU] | PEN_INJECTOR | Freq: Two times a day (BID) | SUBCUTANEOUS | 1 refills | Status: DC

## 2018-04-01 MED ORDER — INSULIN PEN NEEDLE 31G X 5 MM MISC: 1 refills | Status: DC

## 2018-04-01 NOTE — Progress Notes (Signed)
Patient ID: Parker West, male   DOB: [birth year], 48 y.o.   MRN: [id number]           Reason for Appointment: Follow-up for Type 2 Diabetes    History of Present Illness:          Date of diagnosis of type 2 diabetes mellitus: 2016       Background history:  He was admitted to the hospital in 2016 with marked hyperglycemia and acute onset of increased thirst and weakness His blood sugar was over 700 He was discharged on Lantus and Humalog However subsequently with his treatment being coordinated by the [facility] he was switched to 70/30 insulin which he is still taking   Recent history:   Most recent A1c is 6.4, previously 14.5 in 6/19  INSULIN regimen is:  NovoLog Mix 70/30 with a pen, 15 units bid     Non-insulin hypoglycemic drugs the patient is taking are: Ozempic 0.5 mg q. weekly  Current management, blood sugar patterns and problems identified:  He has not been seen in follow-up since his initial consultation in 9/19  At that time he was given Ozempic in addition to his premixed insulin; also his 500 mg metformin was stopped  Also he was advised to make sure he takes his insulin before meals rather than 30-60 minutes later  He says that he is not eating as much and has lost weight  With previous dose of insulin he was starting to feel sleepy after taking the injection and he thought his blood sugars were getting low  He has now cut back his insulin to 15 units twice daily  However occasionally still forgets to take his insulin before eating  Most of the time he is checking blood sugars FASTING only and these are overall fairly good with a range of 108-132  Blood sugar at night was 186, likely to be without taking insulin at suppertime  He was given an Accu-Chek meter on his last visit but he does not have it and is still using the meter given by the [facility]  He is cutting back on fried food        Side effects from medications have been:  Diarrhea from 1000 mg metformin  Compliance with the medical regimen: Improving     Meal times are:  Breakfast is at 8 AM lunch: 1-2 PM dinner: 6-7 p.m.  Typical meal intake: Breakfast is   usually cereal.  Lunch may be cheeseburger              Exercise:  Minimal  Glucose monitoring:  done less than 1 times a day         Glucometer: Precision     Blood Glucose readings as above    Dietician visit, most recent: 7/19  Weight history:   Wt Readings from Last 3 Encounters:  04/01/18 (!) 347 lb 9.6 oz (157.7 kg)  03/10/18 (!) 345 lb (156.5 kg)  11/19/17 (!) 356 lb 12.8 oz (161.8 kg)    Glycemic control:   Lab Results  Component Value Date   HGBA1C 6.4 03/30/2018   HGBA1C 6.0 (A) 03/10/2018   HGBA1C 14.5 (H) 08/31/2017   Lab Results  Component Value Date   MICROALBUR 83.8 (H) 11/19/2017   LDLCALC 89 03/30/2018   CREATININE 1.96 (H) 03/30/2018   Lab Results  Component Value Date   MICRALBCREAT 79.9 (H) 11/19/2017    Lab Results  Component Value Date   FRUCTOSAMINE 214  11/19/2017    Lab on 03/30/2018  Component Date Value Ref Range Status  . Hgb A1c MFr Bld 03/30/2018 6.4  4.6 - 6.5 % Final   Glycemic Control Guidelines for People with Diabetes:Non Diabetic:  <6%Goal of Therapy: <7%Additional Action Suggested:  >8%   . Cholesterol 03/30/2018 153  0 - 200 mg/dL Final   ATP III Classification       Desirable:  < 200 mg/dL               Borderline High:  200 - 239 mg/dL          High:  > = 240 mg/dL  . Triglycerides 03/30/2018 180.0* 0.0 - 149.0 mg/dL Final   Normal:  <150 mg/dLBorderline High:  150 - 199 mg/dL  . HDL 03/30/2018 27.90* >39.00 mg/dL Final  . VLDL 03/30/2018 36.0  0.0 - 40.0 mg/dL Final  . LDL Cholesterol 03/30/2018 89  0 - 99 mg/dL Final  . Total CHOL/HDL Ratio 03/30/2018 5   Final                  Men          Women1/2 Average Risk     3.4          3.3Average Risk          5.0          4.42X Average Risk          9.6          7.13X Average Risk           15.0          11.0                      . NonHDL 03/30/2018 125.31   Final   NOTE:  Non-HDL goal should be 30 mg/dL higher than patient's LDL goal (i.e. LDL goal of < 70 mg/dL, would have non-HDL goal of < 100 mg/dL)  . Sodium 03/30/2018 136  135 - 145 mEq/L Final  . Potassium 03/30/2018 3.3* 3.5 - 5.1 mEq/L Final  . Chloride 03/30/2018 100  96 - 112 mEq/L Final  . CO2 03/30/2018 26  19 - 32 mEq/L Final  . Glucose, Bld 03/30/2018 167* 70 - 99 mg/dL Final  . BUN 03/30/2018 24* 6 - 23 mg/dL Final  . Creatinine, Ser 03/30/2018 1.96* 0.40 - 1.50 mg/dL Final  . Total Bilirubin 03/30/2018 0.5  0.2 - 1.2 mg/dL Final  . Alkaline Phosphatase 03/30/2018 70  39 - 117 U/L Final  . AST 03/30/2018 11  0 - 37 U/L Final  . ALT 03/30/2018 10  0 - 53 U/L Final  . Total Protein 03/30/2018 7.7  6.0 - 8.3 g/dL Final  . Albumin 03/30/2018 3.6  3.5 - 5.2 g/dL Final  . Calcium 03/30/2018 9.2  8.4 - 10.5 mg/dL Final  . GFR 03/30/2018 44.31* >60.00 mL/min Final    Allergies as of 04/01/2018   No Known Allergies     Medication List       Accurate as of April 01, 2018  4:56 PM. Always use your most recent med list.        allopurinol 300 MG tablet Commonly known as:  ZYLOPRIM Take 300 mg by mouth daily as needed (gout).   amLODipine 5 MG tablet Commonly known as:  NORVASC Take 1 tablet (5 mg total) by mouth daily.   aspirin 81 MG chewable tablet Chew 81  mg by mouth daily.   insulin aspart protamine - aspart (70-30) 100 UNIT/ML FlexPen Commonly known as:  NOVOLOG MIX 70/30 FLEXPEN Inject 0.15 mLs (15 Units total) into the skin 2 (two) times daily.   Insulin Pen Needle 31G X 5 MM Misc Use twice a day on insulin pen   insulin starter kit- syringes Misc 1 kit by Other route once.   losartan 100 MG tablet Commonly known as:  COZAAR Take 100 mg by mouth daily.   metoprolol succinate 25 MG 24 hr tablet Commonly known as:  TOPROL-XL Take 1 tablet (25 mg total) by mouth daily.   onetouch  ultrasoft lancets Use as instructed to check blood sugar 3 times a day   Semaglutide(0.25 or 0.5MG/DOS) 2 MG/1.5ML Sopn Commonly known as:  OZEMPIC (0.25 OR 0.5 MG/DOSE) Inject 0.5 mg into the skin once a week.   simvastatin 40 MG tablet Commonly known as:  ZOCOR Take 40 mg by mouth daily.   traZODone 100 MG tablet Commonly known as:  DESYREL Take 400 mg by mouth at bedtime as needed for sleep.       Allergies: No Known Allergies  Past Medical History:  Diagnosis Date  . Anginal pain ([place])   . Diabetes mellitus without complication ([place])   . Dyspnea 10/09/2011   At rest  Recent DVT   . Gout   . Heart murmur    "when I was small"  . Hematochezia 10/09/2011   Low grade antedated DVT  . Hyperlipidemia   . Hypertension   . Microcytic normochromic anemia 10/09/2011   retic 1.6% Hb 9.9 10/09/11  . Obesity   . Pneumonia 1988   "walking"  . Staph infection 09/04/11   BLE/wife's report    Past Surgical History:  Procedure Laterality Date  . EYE SURGERY     stye excision  . REDUCTION MAMMAPLASTY  1990's    Family History  Problem Relation Age of Onset  . Stroke Mother   . Hypertension Mother   . Diabetes Mother   . Breast cancer Mother   . Hypertension Father   . Ulcers Father        peptic  . Diabetes Father   . Colon cancer Maternal Aunt     Social History:  reports that he has never smoked. He has never used smokeless tobacco. He reports current alcohol use. He reports that he does not use drugs.   Review of Systems   Lipid history: Has been on Zocor and he thinks he is taking it more regularly recently    Lab Results  Component Value Date   CHOL 153 03/30/2018   HDL 27.90 (L) 03/30/2018   LDLCALC 89 03/30/2018   TRIG 180.0 (H) 03/30/2018   CHOLHDL 5 03/30/2018           Hypertension: Has been present since about 2011 He was given Norvasc in addition to his losartan on his initial consultation  Occasionally monitors blood pressure at home, recently  about 120/80 or less  BP Readings from Last 3 Encounters:  04/01/18 118/70  03/10/18 (!) 142/82  11/19/17 (!) 152/100    RENAL function: This appears to be worse  Lab Results  Component Value Date   CREATININE 1.96 (H) 03/30/2018   CREATININE 1.60 (H) 11/19/2017   CREATININE 1.69 (H) 09/01/2017     Most recent eye exam was in 2017  Most recent foot exam: 11/2017  Currently known complications of diabetes: Nephropathy  LABS:  Lab on 03/30/2018  Component Date Value Ref Range Status  . Hgb A1c MFr Bld 03/30/2018 6.4  4.6 - 6.5 % Final   Glycemic Control Guidelines for People with Diabetes:Non Diabetic:  <6%Goal of Therapy: <7%Additional Action Suggested:  >8%   . Cholesterol 03/30/2018 153  0 - 200 mg/dL Final   ATP III Classification       Desirable:  < 200 mg/dL               Borderline High:  200 - 239 mg/dL          High:  > = 240 mg/dL  . Triglycerides 03/30/2018 180.0* 0.0 - 149.0 mg/dL Final   Normal:  <150 mg/dLBorderline High:  150 - 199 mg/dL  . HDL 03/30/2018 27.90* >39.00 mg/dL Final  . VLDL 03/30/2018 36.0  0.0 - 40.0 mg/dL Final  . LDL Cholesterol 03/30/2018 89  0 - 99 mg/dL Final  . Total CHOL/HDL Ratio 03/30/2018 5   Final                  Men          Women1/2 Average Risk     3.4          3.3Average Risk          5.0          4.42X Average Risk          9.6          7.13X Average Risk          15.0          11.0                      . NonHDL 03/30/2018 125.31   Final   NOTE:  Non-HDL goal should be 30 mg/dL higher than patient's LDL goal (i.e. LDL goal of < 70 mg/dL, would have non-HDL goal of < 100 mg/dL)  . Sodium 03/30/2018 136  135 - 145 mEq/L Final  . Potassium 03/30/2018 3.3* 3.5 - 5.1 mEq/L Final  . Chloride 03/30/2018 100  96 - 112 mEq/L Final  . CO2 03/30/2018 26  19 - 32 mEq/L Final  . Glucose, Bld 03/30/2018 167* 70 - 99 mg/dL Final  . BUN 03/30/2018 24* 6 - 23 mg/dL Final  . Creatinine, Ser 03/30/2018 1.96* 0.40 - 1.50 mg/dL Final  . Total  Bilirubin 03/30/2018 0.5  0.2 - 1.2 mg/dL Final  . Alkaline Phosphatase 03/30/2018 70  39 - 117 U/L Final  . AST 03/30/2018 11  0 - 37 U/L Final  . ALT 03/30/2018 10  0 - 53 U/L Final  . Total Protein 03/30/2018 7.7  6.0 - 8.3 g/dL Final  . Albumin 03/30/2018 3.6  3.5 - 5.2 g/dL Final  . Calcium 03/30/2018 9.2  8.4 - 10.5 mg/dL Final  . GFR 03/30/2018 44.31* >60.00 mL/min Final    Physical Examination:  BP 118/70 (BP Location: Left Arm, Patient Position: Sitting, Cuff Size: Normal)   Pulse (!) 130   Ht 6' (1.829 m)   Wt (!) 347 lb 9.6 oz (157.7 kg)   SpO2 97%   BMI 47.14 kg/m   No ankle edema present      ASSESSMENT:  Diabetes type 2, insulin requiring with morbid obesity  See history of present illness for detailed discussion of current diabetes management, blood sugar patterns and problems identified   He has had marked improvement in his blood sugar control with adding  Ozempic A1c is now 6.4  Also appears to be needing less insulin than before Difficult to assess blood sugar patterns because of lack of adequate monitoring Also needs more understanding of his diabetes, insulin and lifestyle changes  CHRONIC kidney disease: He has worsening renal function probably partly related to diabetes and likely from hypertension mostly Currently not being followed by her PCP Discussed importance of managing chronic kidney disease and hypertension  HYPERTENSION: This is better with adding amlodipine   PLAN:   No change in Ozempic He can take a stable dose of 15 units of insulin before breakfast and supper and make sure he is doing this before eating Continue to improve diet but also will need meal planning advice and he agrees to see the dietitian Discussed checking blood sugars after meals more often and less in the morning Discussed blood sugar targets and also time course of action of his insulin that needs to be taken before eating He will start using One Touch meter instead  of the 1 supplied by [facility] Consistent follow-up  CKD: He will need a nephrology consultation  Lipid management: Continue simvastatin  Total visit time for evaluation and management of multiple problems and counseling =25 minutes  Patient Instructions  Take 1/2 Losartan daily  Check blood sugars on waking up days 3 a week  Also check blood sugars about 2 hours after meals and do this after different meals by rotation  Recommended blood sugar levels on waking up are 90-130 and about 2 hours after meal is 130-160  Please bring your blood sugar monitor to each visit, thank you  Take 15 units before meals      Elayne Snare 04/01/2018, 4:56 PM   Note: This office note was prepared with Dragon voice recognition system technology. Any transcriptional errors that result from this process are unintentional.

## 2018-04-01 NOTE — Patient Instructions (Addendum)
Take 1/2 Losartan daily  Check blood sugars on waking up days 3 a week  Also check blood sugars about 2 hours after meals and do this after different meals by rotation  Recommended blood sugar levels on waking up are 90-130 and about 2 hours after meal is 130-160  Please bring your blood sugar monitor to each visit, thank you  Take 15 units before meals

## 2018-04-02 ENCOUNTER — Other Ambulatory Visit: Payer: Self-pay

## 2018-04-02 MED ORDER — INSULIN LISPRO (1 UNIT DIAL) 100 UNIT/ML (KWIKPEN): 15.0000 [IU] | PEN_INJECTOR | Freq: Two times a day (BID) | SUBCUTANEOUS | 11 refills | Status: DC

## 2018-04-24 ENCOUNTER — Encounter: Payer: Medicare Other | Admitting: Dietician

## 2018-06-24 ENCOUNTER — Other Ambulatory Visit: Payer: Self-pay | Admitting: Internal Medicine

## 2018-06-24 DIAGNOSIS — E1165 Type 2 diabetes mellitus with hyperglycemia: Secondary | ICD-10-CM

## 2018-06-24 NOTE — Telephone Encounter (Signed)
He is Dr. Remus Blake . I only saw him for that one time

## 2018-06-24 NOTE — Telephone Encounter (Signed)
Not sure if this your pt or a Lucianne Muss pt, please advise

## 2018-06-25 ENCOUNTER — Other Ambulatory Visit: Payer: Self-pay

## 2018-06-25 DIAGNOSIS — E1165 Type 2 diabetes mellitus with hyperglycemia: Secondary | ICD-10-CM

## 2018-06-25 MED ORDER — SEMAGLUTIDE(0.25 OR 0.5MG/DOS) 2 MG/1.5ML ~~LOC~~ SOPN: 0.5000 mg | PEN_INJECTOR | SUBCUTANEOUS | 2 refills | Status: DC

## 2018-06-29 ENCOUNTER — Other Ambulatory Visit (INDEPENDENT_AMBULATORY_CARE_PROVIDER_SITE_OTHER): Payer: No Typology Code available for payment source

## 2018-06-29 DIAGNOSIS — E1121 Type 2 diabetes mellitus with diabetic nephropathy: Secondary | ICD-10-CM | POA: Diagnosis not present

## 2018-06-29 DIAGNOSIS — I10 Essential (primary) hypertension: Secondary | ICD-10-CM

## 2018-06-29 LAB — BASIC METABOLIC PANEL
BUN: 14 mg/dL (ref 6–23)
CO2: 26 mEq/L (ref 19–32)
Calcium: 8.5 mg/dL (ref 8.4–10.5)
Chloride: 104 mEq/L (ref 96–112)
Creatinine, Ser: 1.74 mg/dL — ABNORMAL HIGH (ref 0.40–1.50)
GFR: 50.79 mL/min — ABNORMAL LOW (ref >60.00)
Glucose, Bld: 174 mg/dL — ABNORMAL HIGH (ref 70–99)
Potassium: 3.7 mEq/L (ref 3.5–5.1)
Sodium: 137 mEq/L (ref 135–145)

## 2018-06-29 LAB — HEMOGLOBIN A1C: Hgb A1c MFr Bld: 6.4 % (ref 4.6–6.5)

## 2018-06-30 ENCOUNTER — Other Ambulatory Visit: Payer: Self-pay

## 2018-07-01 ENCOUNTER — Encounter: Payer: Self-pay | Admitting: Endocrinology

## 2018-07-01 ENCOUNTER — Other Ambulatory Visit: Payer: Self-pay

## 2018-07-01 ENCOUNTER — Ambulatory Visit (INDEPENDENT_AMBULATORY_CARE_PROVIDER_SITE_OTHER): Payer: No Typology Code available for payment source | Admitting: Endocrinology

## 2018-07-01 DIAGNOSIS — Z794 Long term (current) use of insulin: Secondary | ICD-10-CM

## 2018-07-01 DIAGNOSIS — E1165 Type 2 diabetes mellitus with hyperglycemia: Secondary | ICD-10-CM | POA: Diagnosis not present

## 2018-07-01 DIAGNOSIS — E1121 Type 2 diabetes mellitus with diabetic nephropathy: Secondary | ICD-10-CM | POA: Diagnosis not present

## 2018-07-01 MED ORDER — ONETOUCH VERIO FLEX SYSTEM W/DEVICE KIT: PACK | 0 refills | Status: DC

## 2018-07-01 MED ORDER — GLUCOSE BLOOD VI STRP: ORAL_STRIP | 3 refills | Status: DC

## 2018-07-01 NOTE — Progress Notes (Signed)
Patient ID: Parker West, male   DOB: 29-Oct-1970, 48 y.o.   MRN: [id number]           Reason for Appointment: Follow-up for Type 2 Diabetes    Today's office visit was provided via telemedicine using video technique Explained to the patient and the the limitations of evaluation and management by telemedicine and the availability of in person appointments.  The patient understood the limitations and agreed to proceed. Patient also understood that the telehealth visit is billable. . Location of the patient: Home . Location of the provider: Office Only the patient and myself were participating in the encounter    History of Present Illness:          Date of diagnosis of type 2 diabetes mellitus: 2016       Background history:  He was admitted to the hospital in 2016 with marked hyperglycemia and acute onset of increased thirst and weakness His blood sugar was over 700 He was discharged on Lantus and Humalog However subsequently with his treatment being coordinated by the [facility] he was switched to 70/30 insulin which he is still taking   Recent history:   Most recent A1c is again 6.4, previously as high as 14.5 in 6/19  INSULIN regimen is:  NovoLog Mix 70/30 with a pen, 15 units the morning only    Non-insulin hypoglycemic drugs the patient is taking are: Ozempic 0.5 mg q. weekly  Current management, blood sugar patterns and problems identified:  He has stopped taking his evening insulin as he thought that his blood sugars are down to 88-90 in the morning and he thought that were too low  However he still is getting some readings as high as 180 at different times, highest after dinner  Since he does not have his meter difficult to know what his blood sugar patterns are  He thinks his blood sugar was 114 today although not able to confirm this and on Monday was 156  He does take his Ozempic regularly  Recently had not been doing any walking or exercise but not  clear if he has gained weight  His A1c is generally lower than expected for his blood sugars  Lab glucose was 174 after breakfast but he had a smoothie and did not take his insulin  Occasionally may miss his morning dose also        Side effects from medications have been: Diarrhea from 1000 mg metformin  Compliance with the medical regimen: Improving     Meal times are:  Breakfast is at 8 AM lunch: 1-2 PM dinner: 6-7 p.m.  Typical meal intake: Breakfast is   frequently cereal.  Lunch may be cheeseburger              Exercise:  Minimal  Glucose monitoring:  done less than 1 times a day         Glucometer:   Now starting One Touch variable    Blood Glucose readings usually between 88-180 Recent morning sugars 114, 156 and bedtime 125    Dietician visit, most recent: 7/19  Weight history:   Wt Readings from Last 3 Encounters:  04/01/18 (!) 347 lb 9.6 oz (157.7 kg)  03/10/18 (!) 345 lb (156.5 kg)  11/19/17 (!) 356 lb 12.8 oz (161.8 kg)    Glycemic control:   Lab Results  Component Value Date   HGBA1C 6.4 06/29/2018   HGBA1C 6.4 03/30/2018   HGBA1C 6.0 (A) 03/10/2018   Lab  Results  Component Value Date   MICROALBUR 83.8 (H) 11/19/2017   LDLCALC 89 03/30/2018   CREATININE 1.74 (H) 06/29/2018   Lab Results  Component Value Date   MICRALBCREAT 79.9 (H) 11/19/2017    Lab Results  Component Value Date   FRUCTOSAMINE 214 11/19/2017    Lab on 06/29/2018  Component Date Value Ref Range Status  . Sodium 06/29/2018 137  135 - 145 mEq/L Final  . Potassium 06/29/2018 3.7  3.5 - 5.1 mEq/L Final  . Chloride 06/29/2018 104  96 - 112 mEq/L Final  . CO2 06/29/2018 26  19 - 32 mEq/L Final  . Glucose, Bld 06/29/2018 174* 70 - 99 mg/dL Final  . BUN 06/29/2018 14  6 - 23 mg/dL Final  . Creatinine, Ser 06/29/2018 1.74* 0.40 - 1.50 mg/dL Final  . Calcium 06/29/2018 8.5  8.4 - 10.5 mg/dL Final  . GFR 06/29/2018 50.79* >60.00 mL/min Final  . Hgb A1c MFr Bld 06/29/2018 6.4   4.6 - 6.5 % Final   Glycemic Control Guidelines for People with Diabetes:Non Diabetic:  <6%Goal of Therapy: <7%Additional Action Suggested:  >8%     Allergies as of 07/01/2018   No Known Allergies     Medication List       Accurate as of July 01, 2018 11:17 AM. Always use your most recent med list.        allopurinol 300 MG tablet Commonly known as:  ZYLOPRIM Take 300 mg by mouth daily as needed (gout).   amLODipine 5 MG tablet Commonly known as:  NORVASC Take 1 tablet (5 mg total) by mouth daily.   aspirin 81 MG chewable tablet Chew 81 mg by mouth daily.   HumaLOG KwikPen 100 UNIT/ML KwikPen Generic drug:  insulin lispro Inject 15 Units into the skin daily. Inject 15 units under the skin once daily.   Insulin Pen Needle 31G X 5 MM Misc Use twice a day on insulin pen   insulin starter kit- syringes Misc 1 kit by Other route once.   losartan 100 MG tablet Commonly known as:  COZAAR Take 100 mg by mouth daily.   onetouch ultrasoft lancets Use as instructed to check blood sugar 3 times a day   Semaglutide(0.25 or 0.5MG/DOS) 2 MG/1.5ML Sopn Commonly known as:  Ozempic (0.25 or 0.5 MG/DOSE) Inject 0.5 mg into the skin once a week.   simvastatin 40 MG tablet Commonly known as:  ZOCOR Take 40 mg by mouth daily.   traZODone 100 MG tablet Commonly known as:  DESYREL Take 400 mg by mouth at bedtime as needed for sleep.       Allergies: No Known Allergies  Past Medical History:  Diagnosis Date  . Anginal pain ([place])   . Diabetes mellitus without complication ([place])   . Dyspnea 10/09/2011   At rest  Recent DVT   . Gout   . Heart murmur    "when I was small"  . Hematochezia 10/09/2011   Low grade antedated DVT  . Hyperlipidemia   . Hypertension   . Microcytic normochromic anemia 10/09/2011   retic 1.6% Hb 9.9 10/09/11  . Obesity   . Pneumonia 1988   "walking"  . Staph infection 09/04/11   BLE/wife's report    Past Surgical History:  Procedure Laterality Date   . EYE SURGERY     stye excision  . REDUCTION MAMMAPLASTY  1990's    Family History  Problem Relation Age of Onset  . Stroke Mother   .  Hypertension Mother   . Diabetes Mother   . Breast cancer Mother   . Hypertension Father   . Ulcers Father        peptic  . Diabetes Father   . Colon cancer Maternal Aunt     Social History:  reports that he has never smoked. He has never used smokeless tobacco. He reports current alcohol use. He reports that he does not use drugs.   Review of Systems   Lipid history: Has been on Zocor and he thinks he is taking it daily    Lab Results  Component Value Date   CHOL 153 03/30/2018   HDL 27.90 (L) 03/30/2018   LDLCALC 89 03/30/2018   TRIG 180.0 (H) 03/30/2018   CHOLHDL 5 03/30/2018           Hypertension: Has been present since about 2011 He was given Norvasc in addition to his losartan on his initial consultation  He was told to cut back his losartan to half a tablet in January because of low potassium and higher creatinine He does monitor blood pressure at home  BP Readings from Last 3 Encounters:  04/01/18 118/70  03/10/18 (!) 142/82  11/19/17 (!) 152/100    RENAL function: This appears to be better  Lab Results  Component Value Date   CREATININE 1.74 (H) 06/29/2018   CREATININE 1.96 (H) 03/30/2018   CREATININE 1.60 (H) 11/19/2017   Lab Results  Component Value Date   K 3.7 06/29/2018     Most recent eye exam was in 2017  Most recent foot exam: 11/2017  Currently known complications of diabetes: Nephropathy  LABS:  Lab on 06/29/2018  Component Date Value Ref Range Status  . Sodium 06/29/2018 137  135 - 145 mEq/L Final  . Potassium 06/29/2018 3.7  3.5 - 5.1 mEq/L Final  . Chloride 06/29/2018 104  96 - 112 mEq/L Final  . CO2 06/29/2018 26  19 - 32 mEq/L Final  . Glucose, Bld 06/29/2018 174* 70 - 99 mg/dL Final  . BUN 06/29/2018 14  6 - 23 mg/dL Final  . Creatinine, Ser 06/29/2018 1.74* 0.40 - 1.50 mg/dL Final   . Calcium 06/29/2018 8.5  8.4 - 10.5 mg/dL Final  . GFR 06/29/2018 50.79* >60.00 mL/min Final  . Hgb A1c MFr Bld 06/29/2018 6.4  4.6 - 6.5 % Final   Glycemic Control Guidelines for People with Diabetes:Non Diabetic:  <6%Goal of Therapy: <7%Additional Action Suggested:  >8%     Physical Examination:  There were no vitals taken for this visit.  No ankle edema present      ASSESSMENT:  Diabetes type 2, insulin requiring with morbid obesity  See history of present illness for detailed discussion of current diabetes management, blood sugar patterns and problems identified   He has had marked improvement in his blood sugar control with adding Ozempic A1c is again 6.4  He has as before benefited from [place] and currently taking only 15 units insulin once a day in the morning However appears to have relatively high readings fasting or after dinner and does need some insulin in the evening also   CHRONIC kidney disease: He has slightly better creatinine Does have nephropathy and effects of hypertension Considering his age he needs to be preferably followed by nephrologist  HYPERTENSION: This is better with adding amlodipine   PLAN:   No change in Ozempic He can take a dose of 10 units of insulin at dinnertime instead of 15 Consistent diet Start  walking for exercise Needs to take his morning insulin consistently  CKD: Improved, will defer further management or referral to his PCP    There are no Patient Instructions on file for this visit.   Elayne Snare 07/01/2018, 11:17 AM   Note: This office note was prepared with Dragon voice recognition system technology. Any transcriptional errors that result from this process are unintentional.

## 2018-09-30 ENCOUNTER — Ambulatory Visit: Payer: No Typology Code available for payment source | Admitting: Endocrinology

## 2018-10-18 ENCOUNTER — Other Ambulatory Visit: Payer: Self-pay | Admitting: Endocrinology

## 2018-10-18 DIAGNOSIS — E1165 Type 2 diabetes mellitus with hyperglycemia: Secondary | ICD-10-CM

## 2018-10-20 ENCOUNTER — Other Ambulatory Visit (INDEPENDENT_AMBULATORY_CARE_PROVIDER_SITE_OTHER): Payer: No Typology Code available for payment source

## 2018-10-20 ENCOUNTER — Other Ambulatory Visit: Payer: Self-pay

## 2018-10-20 DIAGNOSIS — Z794 Long term (current) use of insulin: Secondary | ICD-10-CM | POA: Diagnosis not present

## 2018-10-20 DIAGNOSIS — E1165 Type 2 diabetes mellitus with hyperglycemia: Secondary | ICD-10-CM | POA: Diagnosis not present

## 2018-10-20 LAB — COMPREHENSIVE METABOLIC PANEL
ALT: 13 U/L (ref 0–53)
AST: 14 U/L (ref 0–37)
Albumin: 3.5 g/dL (ref 3.5–5.2)
Alkaline Phosphatase: 59 U/L (ref 39–117)
BUN: 18 mg/dL (ref 6–23)
CO2: 24 mEq/L (ref 19–32)
Calcium: 8.9 mg/dL (ref 8.4–10.5)
Chloride: 105 mEq/L (ref 96–112)
Creatinine, Ser: 1.77 mg/dL — ABNORMAL HIGH (ref 0.40–1.50)
GFR: 49.73 mL/min — ABNORMAL LOW (ref >60.00)
Glucose, Bld: 116 mg/dL — ABNORMAL HIGH (ref 70–99)
Potassium: 3.5 mEq/L (ref 3.5–5.1)
Sodium: 136 mEq/L (ref 135–145)
Total Bilirubin: 0.4 mg/dL (ref 0.2–1.2)
Total Protein: 7.6 g/dL (ref 6.0–8.3)

## 2018-10-20 LAB — HEMOGLOBIN A1C: Hgb A1c MFr Bld: 6.6 % — ABNORMAL HIGH (ref 4.6–6.5)

## 2018-10-22 ENCOUNTER — Other Ambulatory Visit: Payer: Self-pay

## 2018-10-22 ENCOUNTER — Encounter: Payer: Self-pay | Admitting: Endocrinology

## 2018-10-22 ENCOUNTER — Ambulatory Visit (INDEPENDENT_AMBULATORY_CARE_PROVIDER_SITE_OTHER): Payer: No Typology Code available for payment source | Admitting: Endocrinology

## 2018-10-22 DIAGNOSIS — N289 Disorder of kidney and ureter, unspecified: Secondary | ICD-10-CM

## 2018-10-22 DIAGNOSIS — E1165 Type 2 diabetes mellitus with hyperglycemia: Secondary | ICD-10-CM

## 2018-10-22 MED ORDER — OZEMPIC (0.25 OR 0.5 MG/DOSE) 2 MG/1.5ML ~~LOC~~ SOPN: 0.5000 mg | PEN_INJECTOR | SUBCUTANEOUS | 2 refills | Status: DC

## 2018-10-22 NOTE — Progress Notes (Signed)
Patient ID: Parker West, male   DOB: 04/09/1970, 48 y.o.   MRN: [id number]           Reason for Appointment: Follow-up for Type 2 Diabetes    Today's office visit was provided via telemedicine using video technique Explained to the patient and the the limitations of evaluation and management by telemedicine and the availability of in person appointments.  The patient understood the limitations and agreed to proceed. Patient also understood that the telehealth visit is billable. . Location of the patient: Home . Location of the provider: Office Only the patient and myself were participating in the encounter    History of Present Illness:          Date of diagnosis of type 2 diabetes mellitus: 2016       Background history:  He was admitted to the hospital in 2016 with marked hyperglycemia and acute onset of increased thirst and weakness His blood sugar was over 700 He was discharged on Lantus and Humalog However subsequently with his treatment being coordinated by the [facility] he was switched to 70/30 insulin which he is still taking   Recent history:   Most recent A1c is stable at 6.6.  Previously as high as 14.5 in 6/19  INSULIN regimen is:  Usually none   Non-insulin hypoglycemic drugs the patient is taking are: Ozempic 0.5 mg q. weekly  Current management, blood sugar patterns and problems identified:  He has stopped taking his insulin and may have taken it only rarely when his blood sugar has been over 150  However his sugars are being checked only in the mornings and rarely in the evenings around 6-7 PM  His lab glucose was down to 116 checked midmorning  Since his last visit he has tried to be a little more active although not doing a lot of formal exercise  He thinks he may have lost a little weight although was not measuring this  He tends to eat out and does not always make the best choices  Previously [facility] by [facility]itian at [facility] and  does not have any formal meal plan  No side effects with Ozempic        Side effects from medications have been: Diarrhea from 1000 mg metformin  Compliance with the medical regimen: Improving     Meal times are:  Breakfast is at 8 AM lunch: 1-2 PM dinner: 6-7 p.m.  Typical meal intake: Breakfast is   sometimes cereal.  Lunch may be cheeseburger               Glucose monitoring:  done less than 1 times a day         Glucometer:  One Touch     Blood Glucose readings  MORNING range recently 121-159 Evening 131, 142  Average not available    Dietician visit, most recent: 7/19  Weight history:   Wt Readings from Last 3 Encounters:  04/01/18 (!) 347 lb 9.6 oz (157.7 kg)  03/10/18 (!) 345 lb (156.5 kg)  11/19/17 (!) 356 lb 12.8 oz (161.8 kg)    Glycemic control:   Lab Results  Component Value Date   HGBA1C 6.6 (H) 10/20/2018   HGBA1C 6.4 06/29/2018   HGBA1C 6.4 03/30/2018   Lab Results  Component Value Date   MICROALBUR 83.8 (H) 11/19/2017   LDLCALC 89 03/30/2018   CREATININE 1.77 (H) 10/20/2018   Lab Results  Component Value Date   MICRALBCREAT 79.9 (H) 11/19/2017  Lab Results  Component Value Date   FRUCTOSAMINE 214 11/19/2017    Lab on 10/20/2018  Component Date Value Ref Range Status  . Hgb A1c MFr Bld 10/20/2018 6.6* 4.6 - 6.5 % Final   Glycemic Control Guidelines for People with Diabetes:Non Diabetic:  <6%Goal of Therapy: <7%Additional Action Suggested:  >8%   . Sodium 10/20/2018 136  135 - 145 mEq/L Final  . Potassium 10/20/2018 3.5  3.5 - 5.1 mEq/L Final  . Chloride 10/20/2018 105  96 - 112 mEq/L Final  . CO2 10/20/2018 24  19 - 32 mEq/L Final  . Glucose, Bld 10/20/2018 116* 70 - 99 mg/dL Final  . BUN 10/20/2018 18  6 - 23 mg/dL Final  . Creatinine, Ser 10/20/2018 1.77* 0.40 - 1.50 mg/dL Final  . Total Bilirubin 10/20/2018 0.4  0.2 - 1.2 mg/dL Final  . Alkaline Phosphatase 10/20/2018 59  39 - 117 U/L Final  . AST 10/20/2018 14  0 - 37 U/L  Final  . ALT 10/20/2018 13  0 - 53 U/L Final  . Total Protein 10/20/2018 7.6  6.0 - 8.3 g/dL Final  . Albumin 10/20/2018 3.5  3.5 - 5.2 g/dL Final  . Calcium 10/20/2018 8.9  8.4 - 10.5 mg/dL Final  . GFR 10/20/2018 49.73* >60.00 mL/min Final    Allergies as of 10/22/2018   No Known Allergies     Medication List       Accurate as of October 22, 2018  2:11 PM. If you have any questions, ask your nurse or doctor.        allopurinol 300 MG tablet Commonly known as: ZYLOPRIM Take 300 mg by mouth daily as needed (gout).   amLODipine 5 MG tablet Commonly known as: NORVASC Take 1 tablet (5 mg total) by mouth daily.   aspirin 81 MG chewable tablet Chew 81 mg by mouth daily.   glucose blood test strip Use OneTouch verio test strips to check blood sugar twice daily.   HumaLOG KwikPen 100 UNIT/ML KwikPen Generic drug: insulin lispro Inject 10 Units into the skin daily. Inject 10 units under the skin once daily.   Insulin Pen Needle 31G X 5 MM Misc Use twice a day on insulin pen   insulin starter kit- syringes Misc 1 kit by Other route once.   losartan 100 MG tablet Commonly known as: COZAAR Take 100 mg by mouth daily.   onetouch ultrasoft lancets Use as instructed to check blood sugar 3 times a day   OneTouch Verio Flex System w/Device Kit Use as instructed to check blood sugar twice daily.   Semaglutide(0.25 or 0.5MG/DOS) 2 MG/1.5ML Sopn Commonly known as: Ozempic (0.25 or 0.5 MG/DOSE) Inject 0.5 mg into the skin once a week.   simvastatin 40 MG tablet Commonly known as: ZOCOR Take 40 mg by mouth daily.   traZODone 100 MG tablet Commonly known as: DESYREL Take 400 mg by mouth at bedtime as needed for sleep.       Allergies: No Known Allergies  Past Medical History:  Diagnosis Date  . Anginal pain ([place])   . Diabetes mellitus without complication ([place])   . Dyspnea 10/09/2011   At rest  Recent DVT   . Gout   . Heart murmur    "when I was small"  .  Hematochezia 10/09/2011   Low grade antedated DVT  . Hyperlipidemia   . Hypertension   . Microcytic normochromic anemia 10/09/2011   retic 1.6% Hb 9.9 10/09/11  . Obesity   .  Pneumonia 1988   "walking"  . Staph infection 09/04/11   BLE/wife's report    Past Surgical History:  Procedure Laterality Date  . EYE SURGERY     stye excision  . REDUCTION MAMMAPLASTY  1990's    Family History  Problem Relation Age of Onset  . Stroke Mother   . Hypertension Mother   . Diabetes Mother   . Breast cancer Mother   . Hypertension Father   . Ulcers Father        peptic  . Diabetes Father   . Colon cancer Maternal Aunt     Social History:  reports that he has never smoked. He has never used smokeless tobacco. He reports current alcohol use. He reports that he does not use drugs.   Review of Systems   Lipid history: Has been on Zocor and he thinks he is taking it daily    Lab Results  Component Value Date   CHOL 153 03/30/2018   HDL 27.90 (L) 03/30/2018   LDLCALC 89 03/30/2018   TRIG 180.0 (H) 03/30/2018   CHOLHDL 5 03/30/2018           Hypertension: Has been present since about 2011 He was given Norvasc in addition to his losartan on his initial consultation  He was told to cut back his losartan to half a tablet in January because of low potassium and higher creatinine He does monitor blood pressure at home  BP Readings from Last 3 Encounters:  04/01/18 118/70  03/10/18 (!) 142/82  11/19/17 (!) 152/100    RENAL function: This appears to be better  Lab Results  Component Value Date   CREATININE 1.77 (H) 10/20/2018   CREATININE 1.74 (H) 06/29/2018   CREATININE 1.96 (H) 03/30/2018   Lab Results  Component Value Date   K 3.5 10/20/2018     Most recent eye exam was in 2017  Most recent foot exam: 11/2017  Currently known complications of diabetes: Nephropathy  LABS:  Lab on 10/20/2018  Component Date Value Ref Range Status  . Hgb A1c MFr Bld 10/20/2018 6.6* 4.6  - 6.5 % Final   Glycemic Control Guidelines for People with Diabetes:Non Diabetic:  <6%Goal of Therapy: <7%Additional Action Suggested:  >8%   . Sodium 10/20/2018 136  135 - 145 mEq/L Final  . Potassium 10/20/2018 3.5  3.5 - 5.1 mEq/L Final  . Chloride 10/20/2018 105  96 - 112 mEq/L Final  . CO2 10/20/2018 24  19 - 32 mEq/L Final  . Glucose, Bld 10/20/2018 116* 70 - 99 mg/dL Final  . BUN 10/20/2018 18  6 - 23 mg/dL Final  . Creatinine, Ser 10/20/2018 1.77* 0.40 - 1.50 mg/dL Final  . Total Bilirubin 10/20/2018 0.4  0.2 - 1.2 mg/dL Final  . Alkaline Phosphatase 10/20/2018 59  39 - 117 U/L Final  . AST 10/20/2018 14  0 - 37 U/L Final  . ALT 10/20/2018 13  0 - 53 U/L Final  . Total Protein 10/20/2018 7.6  6.0 - 8.3 g/dL Final  . Albumin 10/20/2018 3.5  3.5 - 5.2 g/dL Final  . Calcium 10/20/2018 8.9  8.4 - 10.5 mg/dL Final  . GFR 10/20/2018 49.73* >60.00 mL/min Final    Physical Examination:  There were no vitals taken for this visit.  No ankle edema present      ASSESSMENT:  Diabetes type 2, insulin requiring with morbid obesity  See history of present illness for detailed discussion of current diabetes management, blood sugar patterns  and problems identified   He has had consistent improvement in his blood sugar control with adding Ozempic On his own he has reduced and tapered off his insulin essentially which was a low dose anyway  He is benefiting from starting a walking program He is not always planning his meals well and is interested in consultation with a dietitian to help with this Currently also not checking blood sugars enough and only a few readings in the mornings   CHRONIC kidney disease: Unchanged  HYPERTENSION: Has not had follow-up with PCP or office visit to measure blood pressure   PLAN:   No change in Ozempic He will check sugars after meals consistently and let us know if they are tending to be over 180 consistently May not need insulin if he is  consistent in diet Since he has renal insufficiency is not a good candidate for any other diabetes drugs except possibly sulfonylureas  CKD: Stable, will defer further management or nephrology consultation to his PCP  Needs to talk to his PCP and follow-up for his hypertension since she is prescribing his losartan  There are no Patient Instructions on file for this visit.   Elayne Snare 10/22/2018, 2:11 PM   Note: This office note was prepared with Dragon voice recognition system technology. Any transcriptional errors that result from this process are unintentional.

## 2019-01-04 ENCOUNTER — Other Ambulatory Visit: Payer: Self-pay

## 2019-01-04 ENCOUNTER — Other Ambulatory Visit (INDEPENDENT_AMBULATORY_CARE_PROVIDER_SITE_OTHER): Payer: No Typology Code available for payment source

## 2019-01-04 DIAGNOSIS — E1165 Type 2 diabetes mellitus with hyperglycemia: Secondary | ICD-10-CM

## 2019-01-04 LAB — COMPREHENSIVE METABOLIC PANEL
ALT: 10 U/L (ref 0–53)
AST: 10 U/L (ref 0–37)
Albumin: 3.4 g/dL — ABNORMAL LOW (ref 3.5–5.2)
Alkaline Phosphatase: 72 U/L (ref 39–117)
BUN: 13 mg/dL (ref 6–23)
CO2: 26 mEq/L (ref 19–32)
Calcium: 8.5 mg/dL (ref 8.4–10.5)
Chloride: 103 mEq/L (ref 96–112)
Creatinine, Ser: 1.63 mg/dL — ABNORMAL HIGH (ref 0.40–1.50)
GFR: 54.65 mL/min — ABNORMAL LOW (ref >60.00)
Glucose, Bld: 139 mg/dL — ABNORMAL HIGH (ref 70–99)
Potassium: 3.8 mEq/L (ref 3.5–5.1)
Sodium: 136 mEq/L (ref 135–145)
Total Bilirubin: 0.3 mg/dL (ref 0.2–1.2)
Total Protein: 7.3 g/dL (ref 6.0–8.3)

## 2019-01-04 LAB — LIPID PANEL
Cholesterol: 140 mg/dL (ref 0–200)
HDL: 27.9 mg/dL — ABNORMAL LOW (ref >39.00)
LDL Cholesterol: 90 mg/dL (ref 0–99)
NonHDL: 112.58
Total CHOL/HDL Ratio: 5
Triglycerides: 113 mg/dL (ref 0.0–149.0)
VLDL: 22.6 mg/dL (ref 0.0–40.0)

## 2019-01-04 LAB — HEMOGLOBIN A1C: Hgb A1c MFr Bld: 6.4 % (ref 4.6–6.5)

## 2019-01-04 LAB — MICROALBUMIN / CREATININE URINE RATIO
Creatinine,U: 113.6 mg/dL
Microalb Creat Ratio: 108 mg/g — ABNORMAL HIGH (ref 0.0–30.0)
Microalb, Ur: 122.7 mg/dL — ABNORMAL HIGH (ref 0.0–1.9)

## 2019-01-07 ENCOUNTER — Other Ambulatory Visit: Payer: Self-pay

## 2019-01-07 ENCOUNTER — Encounter: Payer: No Typology Code available for payment source | Admitting: Endocrinology

## 2019-01-07 ENCOUNTER — Encounter: Payer: Self-pay | Admitting: Endocrinology

## 2019-01-07 MED ORDER — ONETOUCH VERIO FLEX SYSTEM W/DEVICE KIT: PACK | 0 refills | Status: DC

## 2019-01-07 MED ORDER — GLUCOSE BLOOD VI STRP: ORAL_STRIP | 3 refills | Status: AC

## 2019-01-10 NOTE — Progress Notes (Signed)
This encounter was created in error - please disregard.

## 2019-01-14 ENCOUNTER — Other Ambulatory Visit: Payer: Self-pay | Admitting: Endocrinology

## 2019-01-14 DIAGNOSIS — E1165 Type 2 diabetes mellitus with hyperglycemia: Secondary | ICD-10-CM

## 2019-03-02 ENCOUNTER — Ambulatory Visit (INDEPENDENT_AMBULATORY_CARE_PROVIDER_SITE_OTHER): Payer: No Typology Code available for payment source | Admitting: Endocrinology

## 2019-03-02 ENCOUNTER — Other Ambulatory Visit: Payer: Self-pay

## 2019-03-02 ENCOUNTER — Encounter: Payer: Self-pay | Admitting: Endocrinology

## 2019-03-02 DIAGNOSIS — E1121 Type 2 diabetes mellitus with diabetic nephropathy: Secondary | ICD-10-CM | POA: Diagnosis not present

## 2019-03-02 MED ORDER — FREESTYLE LIBRE 2 READER SYSTM DEVI: 1.0000 | Freq: Once | 0 refills | Status: AC

## 2019-03-02 MED ORDER — OZEMPIC (1 MG/DOSE) 2 MG/1.5ML ~~LOC~~ SOPN: 1.0000 mg | PEN_INJECTOR | SUBCUTANEOUS | 3 refills | Status: DC

## 2019-03-02 MED ORDER — FREESTYLE LIBRE 2 SENSOR SYSTM MISC: 1.0000 [IU] | Freq: Once | 3 refills | Status: AC

## 2019-03-02 NOTE — Progress Notes (Signed)
Patient ID: Parker West, male   DOB: [birth year], 48 y.o.   MRN: [id number]           Reason for Appointment: Follow-up for Type 2 Diabetes    Today's office visit was provided via telemedicine using video technique Explained to the patient and the the limitations of evaluation and management by telemedicine and the availability of in person appointments.  The patient understood the limitations and agreed to proceed. Patient also understood that the telehealth visit is billable. . Location of the patient: Home . Location of the provider: Office Only the patient and myself were participating in the encounter    History of Present Illness:          Date of diagnosis of type 2 diabetes mellitus: 2016       Background history:  He was admitted to the hospital in 2016 with marked hyperglycemia and acute onset of increased thirst and weakness His blood sugar was over 700 He was discharged on Lantus and Humalog However subsequently with his treatment being coordinated by the [facility] he was switched to 70/30 insulin which he is still taking   Recent history:   Most recent A1c is stable at 6.6.  Previously as high as 14.5 in 6/19  INSULIN regimen is:  Usually none   Non-insulin hypoglycemic drugs the patient is taking are: Ozempic 0.5 mg q. weekly  Current management, blood sugar patterns and problems identified:  He has somewhat variable blood sugars in the mornings at home but he has a few readings at night after dinner which are looking fairly consistently near normal  He is now trying to avoid snacks although he thinks sometimes blood sugar may be higher in the morning with a late evening meal  He has been feeling that he has lost a little weight with his clothes fitting looser  Has been regular with his Ozempic every week  He is only slightly active with his work routine but no formal exercise despite previous instructions  He says he will take 10 units of  Humalog if his blood sugar is over 140 in the morning but does not check his sugar after breakfast  Recently having mostly a protein shake at breakfast        Side effects from medications have been: Diarrhea from 1000 mg metformin     Meal times are:  Breakfast is at 8 AM lunch: 1-2 PM dinner: 6-7 p.m.           Glucose monitoring:  done less than 1 times a day         Glucometer:  One Touch     Blood Glucose readings  MORNING range recently 98-147, previously 121-159 After dinner 92-124  Average not available    Dietician visit, most recent: 7/19  Weight history:   Wt Readings from Last 3 Encounters:  04/01/18 (!) 347 lb 9.6 oz (157.7 kg)  03/10/18 (!) 345 lb (156.5 kg)  11/19/17 (!) 356 lb 12.8 oz (161.8 kg)    Glycemic control:   Lab Results  Component Value Date   HGBA1C 6.4 01/04/2019   HGBA1C 6.6 (H) 10/20/2018   HGBA1C 6.4 06/29/2018   Lab Results  Component Value Date   MICROALBUR 122.7 (H) 01/04/2019   LDLCALC 90 01/04/2019   CREATININE 1.63 (H) 01/04/2019   Lab Results  Component Value Date   MICRALBCREAT 108.0 (H) 01/04/2019    Lab Results  Component Value Date   FRUCTOSAMINE 214 11/19/2017  No visits with results within 1 Week(s) from this visit.  Latest known visit with results is:  Lab on 01/04/2019  Component Date Value Ref Range Status  . Cholesterol 01/04/2019 140  0 - 200 mg/dL Final   ATP III Classification       Desirable:  < 200 mg/dL               Borderline High:  200 - 239 mg/dL          High:  > = 240 mg/dL  . Triglycerides 01/04/2019 113.0  0.0 - 149.0 mg/dL Final   Normal:  <150 mg/dLBorderline High:  150 - 199 mg/dL  . HDL 01/04/2019 27.90* >39.00 mg/dL Final  . VLDL 01/04/2019 22.6  0.0 - 40.0 mg/dL Final  . LDL Cholesterol 01/04/2019 90  0 - 99 mg/dL Final  . Total CHOL/HDL Ratio 01/04/2019 5   Final                  Men          Women1/2 Average Risk     3.4          3.3Average Risk          5.0          4.42X Average  Risk          9.6          7.13X Average Risk          15.0          11.0                      . NonHDL 01/04/2019 112.58   Final   NOTE:  Non-HDL goal should be 30 mg/dL higher than patient's LDL goal (i.e. LDL goal of < 70 mg/dL, would have non-HDL goal of < 100 mg/dL)  . Microalb, Ur 01/04/2019 122.7* 0.0 - 1.9 mg/dL Final  . Creatinine,U 01/04/2019 113.6  mg/dL Final  . Microalb Creat Ratio 01/04/2019 108.0* 0.0 - 30.0 mg/g Final  . Sodium 01/04/2019 136  135 - 145 mEq/L Final  . Potassium 01/04/2019 3.8  3.5 - 5.1 mEq/L Final  . Chloride 01/04/2019 103  96 - 112 mEq/L Final  . CO2 01/04/2019 26  19 - 32 mEq/L Final  . Glucose, Bld 01/04/2019 139* 70 - 99 mg/dL Final  . BUN 01/04/2019 13  6 - 23 mg/dL Final  . Creatinine, Ser 01/04/2019 1.63* 0.40 - 1.50 mg/dL Final  . Total Bilirubin 01/04/2019 0.3  0.2 - 1.2 mg/dL Final  . Alkaline Phosphatase 01/04/2019 72  39 - 117 U/L Final  . AST 01/04/2019 10  0 - 37 U/L Final  . ALT 01/04/2019 10  0 - 53 U/L Final  . Total Protein 01/04/2019 7.3  6.0 - 8.3 g/dL Final  . Albumin 01/04/2019 3.4* 3.5 - 5.2 g/dL Final  . Calcium 01/04/2019 8.5  8.4 - 10.5 mg/dL Final  . GFR 01/04/2019 54.65* >60.00 mL/min Final  . Hgb A1c MFr Bld 01/04/2019 6.4  4.6 - 6.5 % Final   Glycemic Control Guidelines for People with Diabetes:Non Diabetic:  <6%Goal of Therapy: <7%Additional Action Suggested:  >8%     Allergies as of 03/02/2019   No Known Allergies     Medication List       Accurate as of March 02, 2019  8:17 AM. If you have any questions, ask your nurse or doctor.  allopurinol 300 MG tablet Commonly known as: ZYLOPRIM Take 300 mg by mouth daily as needed (gout).   amLODipine 5 MG tablet Commonly known as: NORVASC Take 1 tablet (5 mg total) by mouth daily.   aspirin 81 MG chewable tablet Chew 81 mg by mouth daily.   glucose blood test strip Use OneTouch verio test strips to check blood sugar twice daily.   HumaLOG KwikPen 100  UNIT/ML KwikPen Generic drug: insulin lispro Inject 10 Units into the skin daily. Inject 10 units under the skin once daily.(Takes only for blood sugar over 140)   Insulin Pen Needle 31G X 5 MM Misc Use twice a day on insulin pen   insulin starter kit- syringes Misc 1 kit by Other route once.   losartan 100 MG tablet Commonly known as: COZAAR Take 100 mg by mouth daily.   onetouch ultrasoft lancets Use as instructed to check blood sugar 3 times a day   OneTouch Verio Flex System w/Device Kit Use as instructed to check blood sugar twice daily.   Ozempic (0.25 or 0.5 MG/DOSE) 2 MG/1.5ML Sopn Generic drug: Semaglutide(0.25 or 0.5MG/DOS) Inject 0.5 mg into the skin once a week. DXE11.65   simvastatin 40 MG tablet Commonly known as: ZOCOR Take 40 mg by mouth daily.   traZODone 100 MG tablet Commonly known as: DESYREL Take 400 mg by mouth at bedtime as needed for sleep.       Allergies: No Known Allergies  Past Medical History:  Diagnosis Date  . Anginal pain ([place])   . Diabetes mellitus without complication ([place])   . Dyspnea 10/09/2011   At rest  Recent DVT   . Gout   . Heart murmur    "when I was small"  . Hematochezia 10/09/2011   Low grade antedated DVT  . Hyperlipidemia   . Hypertension   . Microcytic normochromic anemia 10/09/2011   retic 1.6% Hb 9.9 10/09/11  . Obesity   . Pneumonia 1988   "walking"  . Staph infection 09/04/11   BLE/wife's report    Past Surgical History:  Procedure Laterality Date  . EYE SURGERY     stye excision  . REDUCTION MAMMAPLASTY  1990's    Family History  Problem Relation Age of Onset  . Stroke Mother   . Hypertension Mother   . Diabetes Mother   . Breast cancer Mother   . Hypertension Father   . Ulcers Father        peptic  . Diabetes Father   . Colon cancer Maternal Aunt     Social History:  reports that he has never smoked. He has never used smokeless tobacco. He reports current alcohol use. He reports that he does not  use drugs.   Review of Systems   Lipid history: Has been on Zocor and he has good control of lipids      Lab Results  Component Value Date   CHOL 140 01/04/2019   HDL 27.90 (L) 01/04/2019   LDLCALC 90 01/04/2019   TRIG 113.0 01/04/2019   CHOLHDL 5 01/04/2019           Hypertension: Has been present since about 2011 Followed by his PCP  He does monitor blood pressure at home  BP Readings from Last 3 Encounters:  04/01/18 118/70  03/10/18 (!) 142/82  11/19/17 (!) 152/100    RENAL function: This appears to be slightly better, does have microalbuminuria  Lab Results  Component Value Date   CREATININE 1.63 (H) 01/04/2019  CREATININE 1.77 (H) 10/20/2018   CREATININE 1.74 (H) 06/29/2018   Lab Results  Component Value Date   K 3.8 01/04/2019     Most recent eye exam was in 2017 and he is waiting for follow-up appointment  Most recent foot exam: 11/2017  Currently known complications of diabetes: Nephropathy  LABS:  No visits with results within 1 Week(s) from this visit.  Latest known visit with results is:  Lab on 01/04/2019  Component Date Value Ref Range Status  . Cholesterol 01/04/2019 140  0 - 200 mg/dL Final   ATP III Classification       Desirable:  < 200 mg/dL               Borderline High:  200 - 239 mg/dL          High:  > = 240 mg/dL  . Triglycerides 01/04/2019 113.0  0.0 - 149.0 mg/dL Final   Normal:  <150 mg/dLBorderline High:  150 - 199 mg/dL  . HDL 01/04/2019 27.90* >39.00 mg/dL Final  . VLDL 01/04/2019 22.6  0.0 - 40.0 mg/dL Final  . LDL Cholesterol 01/04/2019 90  0 - 99 mg/dL Final  . Total CHOL/HDL Ratio 01/04/2019 5   Final                  Men          Women1/2 Average Risk     3.4          3.3Average Risk          5.0          4.42X Average Risk          9.6          7.13X Average Risk          15.0          11.0                      . NonHDL 01/04/2019 112.58   Final   NOTE:  Non-HDL goal should be 30 mg/dL higher than patient's LDL goal  (i.e. LDL goal of < 70 mg/dL, would have non-HDL goal of < 100 mg/dL)  . Microalb, Ur 01/04/2019 122.7* 0.0 - 1.9 mg/dL Final  . Creatinine,U 01/04/2019 113.6  mg/dL Final  . Microalb Creat Ratio 01/04/2019 108.0* 0.0 - 30.0 mg/g Final  . Sodium 01/04/2019 136  135 - 145 mEq/L Final  . Potassium 01/04/2019 3.8  3.5 - 5.1 mEq/L Final  . Chloride 01/04/2019 103  96 - 112 mEq/L Final  . CO2 01/04/2019 26  19 - 32 mEq/L Final  . Glucose, Bld 01/04/2019 139* 70 - 99 mg/dL Final  . BUN 01/04/2019 13  6 - 23 mg/dL Final  . Creatinine, Ser 01/04/2019 1.63* 0.40 - 1.50 mg/dL Final  . Total Bilirubin 01/04/2019 0.3  0.2 - 1.2 mg/dL Final  . Alkaline Phosphatase 01/04/2019 72  39 - 117 U/L Final  . AST 01/04/2019 10  0 - 37 U/L Final  . ALT 01/04/2019 10  0 - 53 U/L Final  . Total Protein 01/04/2019 7.3  6.0 - 8.3 g/dL Final  . Albumin 01/04/2019 3.4* 3.5 - 5.2 g/dL Final  . Calcium 01/04/2019 8.5  8.4 - 10.5 mg/dL Final  . GFR 01/04/2019 54.65* >60.00 mL/min Final  . Hgb A1c MFr Bld 01/04/2019 6.4  4.6 - 6.5 % Final   Glycemic Control Guidelines for People with Diabetes:Non Diabetic:  <  6%Goal of Therapy: <7%Additional Action Suggested:  >8%     Physical Examination:  There were no vitals taken for this visit.     ASSESSMENT:  Diabetes type 2 with morbid obesity  See history of present illness for detailed discussion of current diabetes management, blood sugar patterns and problems identified   His A1c is slightly better at 6.4  Not clear if he has lost weight but overall doing still fairly well with Ozempic 0.5 mg weekly Occasionally has high readings in the mornings but not consistently and not clear if this is related to diet or snacks or timing of the meal the night before However he does need to start an exercise program Recently has not had any postprandial hyperglycemia   CHRONIC kidney disease: Stable to improved  HYPERTENSION: He is waiting for follow-up with PCP who is  prescribing his medications   PLAN:   Trial of 1 mg Ozempic, he can take 2 injections of the 0.5 on the next injection and if tolerated start taking 1 mg weekly Since he wants to try the freestyle Elenor Legato will prescribe this Discussed that he does not need to take any correction Humalog dose of 10 units insulin simply because his blood sugars are 140 in the morning; he will stop taking insulin and check his blood sugars after breakfast for confirmation  Follow-up in 3 months  There are no Patient Instructions on file for this visit.   Elayne Snare 03/02/2019, 8:17 AM   Note: This office note was prepared with Dragon voice recognition system technology. Any transcriptional errors that result from this process are unintentional.

## 2019-05-12 ENCOUNTER — Other Ambulatory Visit: Payer: Self-pay

## 2019-05-12 ENCOUNTER — Telehealth: Payer: Self-pay

## 2019-05-12 MED ORDER — TRULICITY 1.5 MG/0.5ML ~~LOC~~ SOAJ: 1.5000 mg | SUBCUTANEOUS | 2 refills | Status: DC

## 2019-05-12 NOTE — Telephone Encounter (Signed)
Rx sent for Trulicity

## 2019-05-12 NOTE — Telephone Encounter (Signed)
Received fax from pt's pharmacy stating that Ozempic requires a PA. Would you like to change to another medication or complete PA?

## 2019-05-12 NOTE — Telephone Encounter (Signed)
We can see if Trulicity 1.5 mg weekly is covered.  He needs to be seen in follow-up with labs within the next month or so.  No appointment currently

## 2019-05-14 ENCOUNTER — Other Ambulatory Visit: Payer: Self-pay

## 2019-05-14 ENCOUNTER — Telehealth: Payer: Self-pay | Admitting: Endocrinology

## 2019-05-14 MED ORDER — TRULICITY 1.5 MG/0.5ML ~~LOC~~ SOAJ: 1.5000 mg | SUBCUTANEOUS | 2 refills | Status: DC

## 2019-05-14 NOTE — Telephone Encounter (Signed)
RX sent to [facility] for trulicity needs to be sent to   [facility] [address]  [place], [place] [address] Ph : [phone]  Instead. Patient wants to use this pharmacy for all future prescriptions.

## 2019-05-14 NOTE — Telephone Encounter (Signed)
Rx resent to pharmacy below, and old pharmacy removed from pt's chart.

## 2019-05-19 NOTE — Telephone Encounter (Signed)
Called [facility] insurance and completed verbal PA for Truliciyt 1.5mg . PA has been approved through 05/18/2020.  PA Reference number is [id number]. Called pt and made him aware of this.

## 2019-06-14 ENCOUNTER — Other Ambulatory Visit: Payer: No Typology Code available for payment source

## 2019-06-15 ENCOUNTER — Other Ambulatory Visit (INDEPENDENT_AMBULATORY_CARE_PROVIDER_SITE_OTHER): Payer: No Typology Code available for payment source

## 2019-06-15 ENCOUNTER — Other Ambulatory Visit: Payer: Self-pay

## 2019-06-15 DIAGNOSIS — E1121 Type 2 diabetes mellitus with diabetic nephropathy: Secondary | ICD-10-CM

## 2019-06-15 LAB — BASIC METABOLIC PANEL
BUN: 14 mg/dL (ref 6–23)
CO2: 26 mEq/L (ref 19–32)
Calcium: 8.8 mg/dL (ref 8.4–10.5)
Chloride: 102 mEq/L (ref 96–112)
Creatinine, Ser: 1.66 mg/dL — ABNORMAL HIGH (ref 0.40–1.50)
GFR: 53.41 mL/min — ABNORMAL LOW (ref >60.00)
Glucose, Bld: 250 mg/dL — ABNORMAL HIGH (ref 70–99)
Potassium: 3.9 mEq/L (ref 3.5–5.1)
Sodium: 134 mEq/L — ABNORMAL LOW (ref 135–145)

## 2019-06-15 LAB — HEMOGLOBIN A1C: Hgb A1c MFr Bld: 7 % — ABNORMAL HIGH (ref 4.6–6.5)

## 2019-06-16 ENCOUNTER — Encounter: Payer: Self-pay | Admitting: Endocrinology

## 2019-06-16 ENCOUNTER — Encounter: Payer: No Typology Code available for payment source | Admitting: Endocrinology

## 2019-06-16 ENCOUNTER — Other Ambulatory Visit: Payer: Self-pay

## 2019-06-16 MED ORDER — TRULICITY 1.5 MG/0.5ML ~~LOC~~ SOAJ: 1.5000 mg | SUBCUTANEOUS | 0 refills | Status: DC

## 2019-06-16 MED ORDER — ONETOUCH VERIO FLEX SYSTEM W/DEVICE KIT: PACK | 0 refills | Status: AC

## 2019-06-16 MED ORDER — AMLODIPINE BESYLATE 5 MG PO TABS: 5.0000 mg | ORAL_TABLET | Freq: Every day | ORAL | 0 refills | Status: DC

## 2019-06-16 MED ORDER — INSULIN PEN NEEDLE 31G X 5 MM MISC: 0 refills | Status: AC

## 2019-06-16 MED ORDER — INSULIN LISPRO (1 UNIT DIAL) 100 UNIT/ML (KWIKPEN): PEN_INJECTOR | SUBCUTANEOUS | 0 refills | Status: DC

## 2019-06-16 MED ORDER — ONETOUCH ULTRASOFT LANCETS MISC: 0 refills | Status: AC

## 2019-06-16 NOTE — Progress Notes (Signed)
   Review of Systems   This encounter was created in error - please disregard. 

## 2019-07-05 ENCOUNTER — Ambulatory Visit: Payer: No Typology Code available for payment source | Admitting: Endocrinology

## 2019-07-11 ENCOUNTER — Other Ambulatory Visit: Payer: Self-pay | Admitting: Endocrinology

## 2019-08-12 ENCOUNTER — Other Ambulatory Visit: Payer: Self-pay

## 2019-08-12 MED ORDER — AMLODIPINE BESYLATE 5 MG PO TABS: 5.0000 mg | ORAL_TABLET | Freq: Every day | ORAL | 2 refills | Status: DC

## 2019-09-06 ENCOUNTER — Other Ambulatory Visit: Payer: Self-pay | Admitting: Endocrinology

## 2019-09-19 ENCOUNTER — Other Ambulatory Visit: Payer: Self-pay | Admitting: Endocrinology

## 2019-09-19 DIAGNOSIS — E1165 Type 2 diabetes mellitus with hyperglycemia: Secondary | ICD-10-CM

## 2019-09-20 ENCOUNTER — Other Ambulatory Visit: Payer: No Typology Code available for payment source

## 2019-09-24 ENCOUNTER — Ambulatory Visit: Payer: No Typology Code available for payment source | Admitting: Endocrinology

## 2019-10-04 ENCOUNTER — Other Ambulatory Visit: Payer: Self-pay | Admitting: Endocrinology

## 2019-10-11 ENCOUNTER — Other Ambulatory Visit: Payer: No Typology Code available for payment source

## 2019-10-13 ENCOUNTER — Ambulatory Visit: Payer: No Typology Code available for payment source | Admitting: Endocrinology

## 2019-10-29 ENCOUNTER — Other Ambulatory Visit: Payer: No Typology Code available for payment source

## 2019-11-01 ENCOUNTER — Other Ambulatory Visit: Payer: Self-pay | Admitting: Endocrinology

## 2019-11-02 ENCOUNTER — Ambulatory Visit: Payer: No Typology Code available for payment source | Admitting: Endocrinology

## 2019-11-03 ENCOUNTER — Other Ambulatory Visit: Payer: Self-pay | Admitting: Endocrinology

## 2019-11-03 ENCOUNTER — Telehealth: Payer: Self-pay

## 2019-11-03 NOTE — Telephone Encounter (Signed)
Will be unable to refill unless seen in the office

## 2019-11-03 NOTE — Telephone Encounter (Signed)
Last OV 03/02/19-please advise if ok to refill Humalog

## 2019-11-12 IMAGING — CT CT RENAL STONE PROTOCOL
2 of 4 series · 16 of 46 positions shown, 18 images · non-contrast
Comparison: Abdominal radiograph performed 05/25/2011

CLINICAL DATA: Acute onset of hyperglycemia. Nausea.

EXAM:
CT ABDOMEN AND PELVIS WITHOUT CONTRAST
TECHNIQUE: Multidetector CT imaging of the abdomen and pelvis was performed
following the standard protocol without IV contrast.

[Series 2: axial st · axial · 0.94mm/px · z∈[+1266,+1726]mm · 13 of 104 slices shown, 15 images]
[im 6/104  soft-tissue]
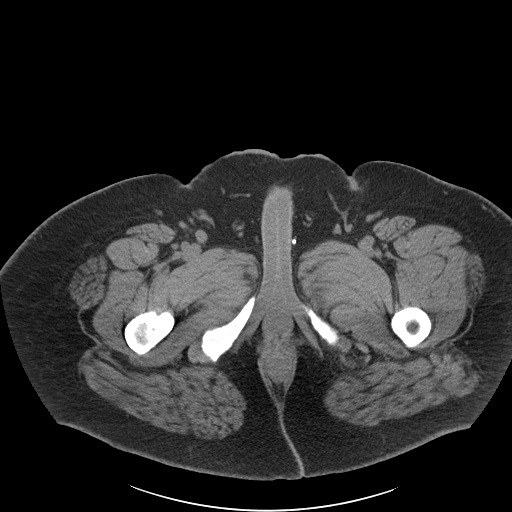
[im 6/104  bone]
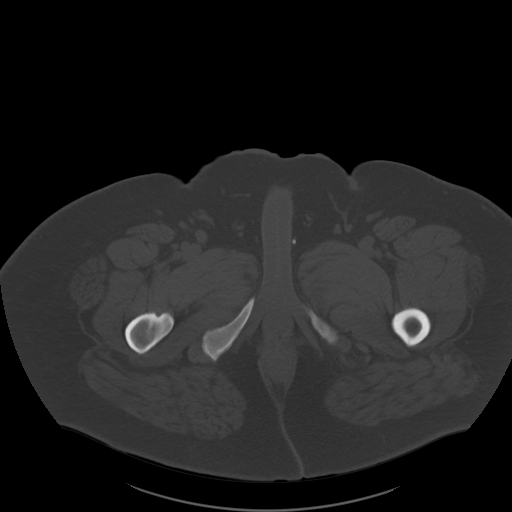
[im 12/104  soft-tissue]
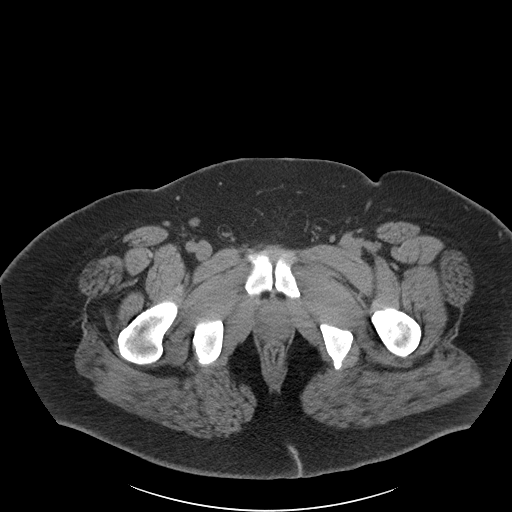
[im 23/104  soft-tissue]
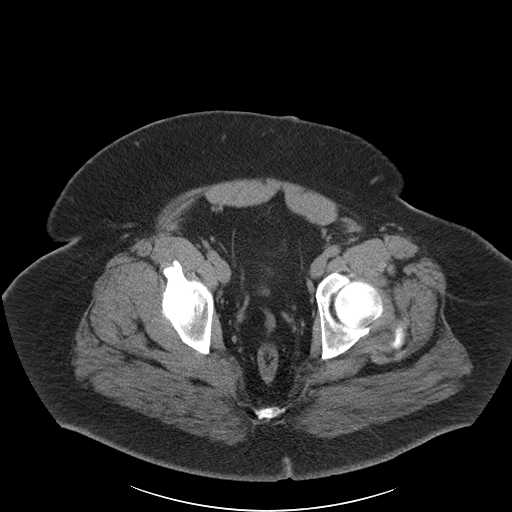
[im 29/104  soft-tissue]
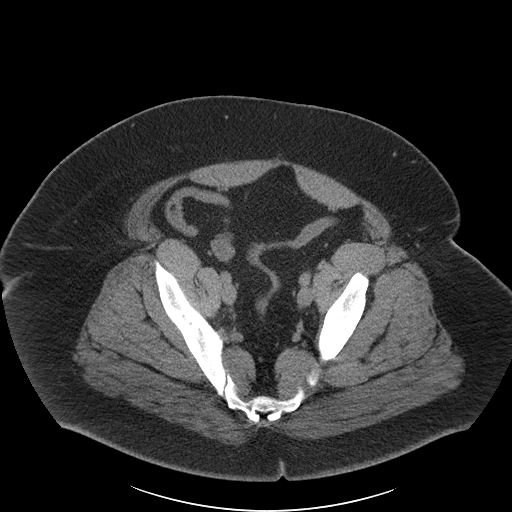
[im 35/104  soft-tissue]
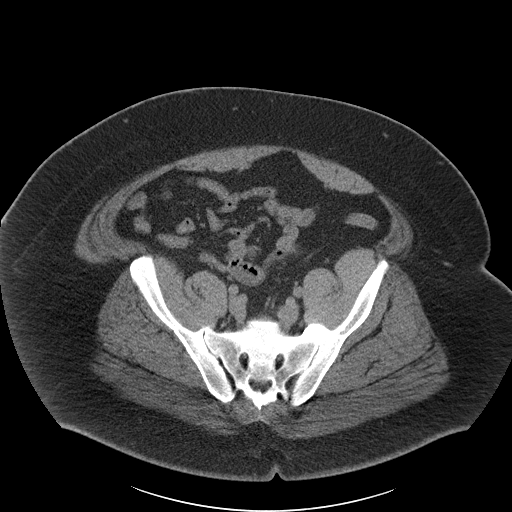
[im 46/104  soft-tissue]
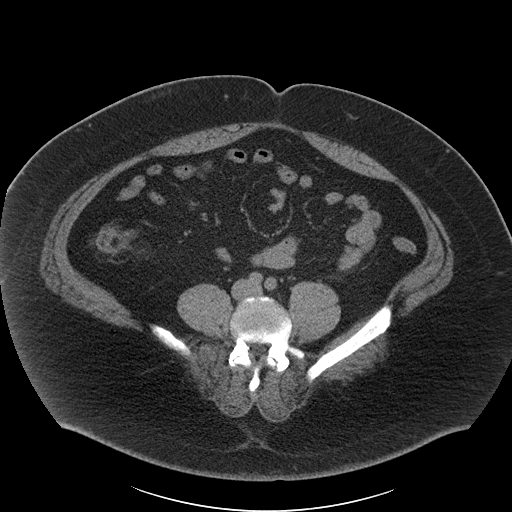
[im 52/104  soft-tissue]
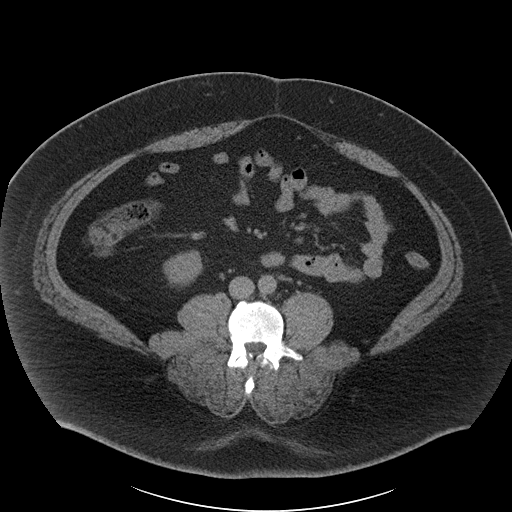
[im 58/104  soft-tissue]
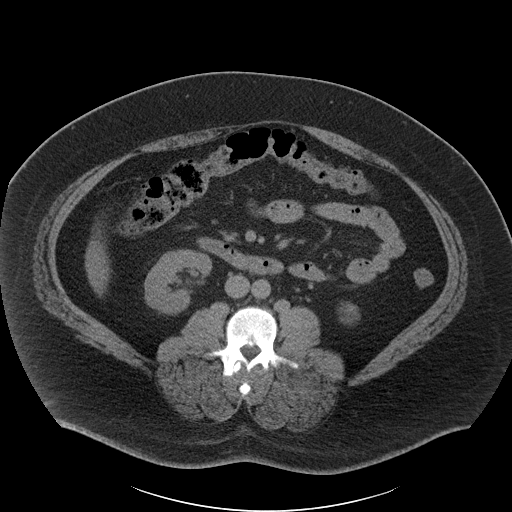
[im 69/104  soft-tissue]
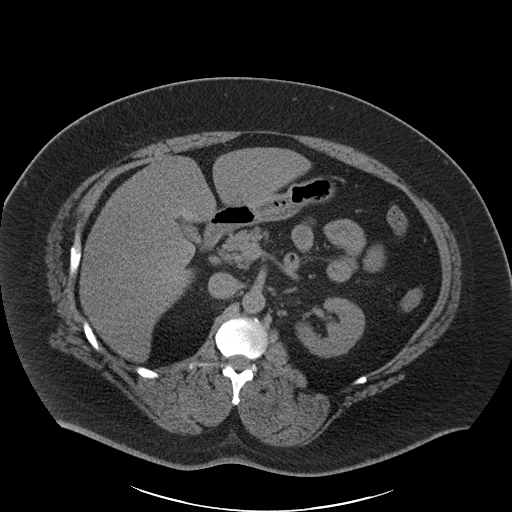
[im 69/104  bone]
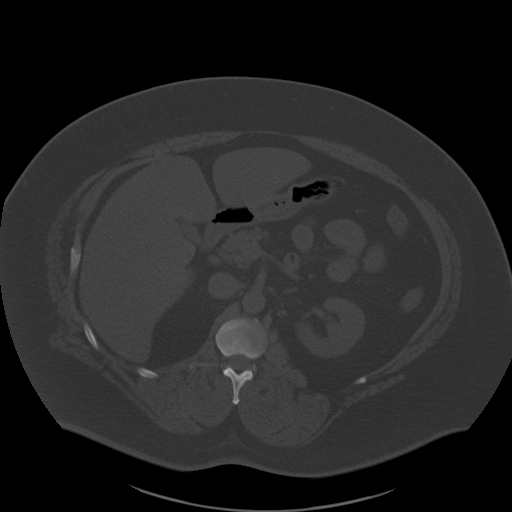
[im 75/104  soft-tissue]
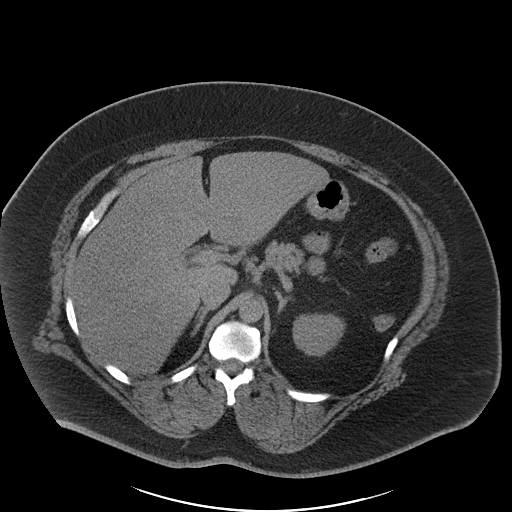
[im 81/104  soft-tissue]
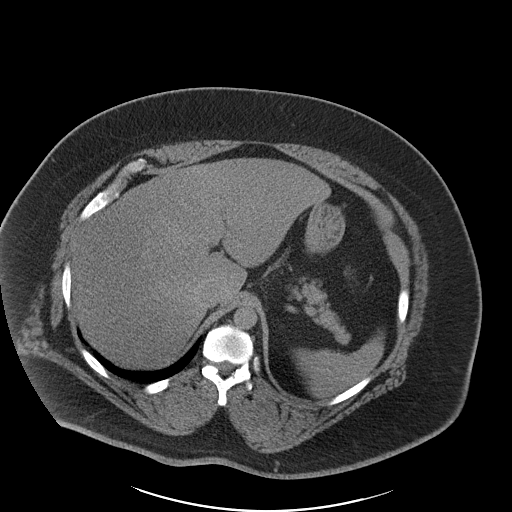
[im 92/104  soft-tissue]
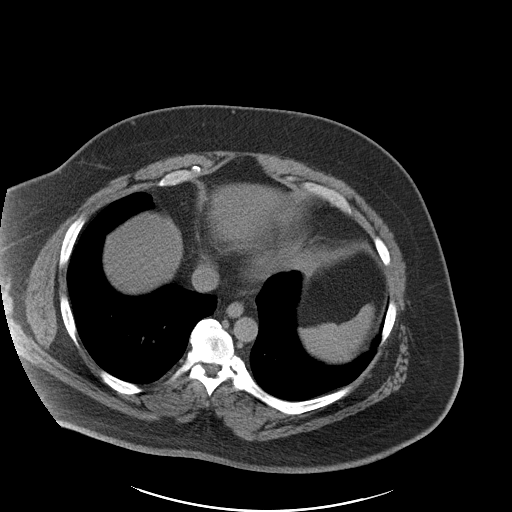
[im 98/104  soft-tissue]
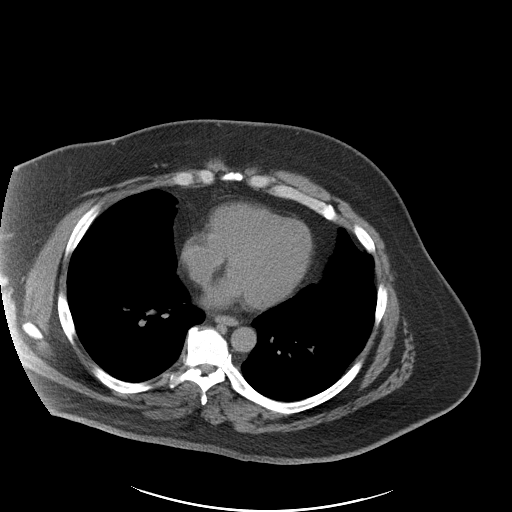

[Series 5: coronal · coronal · 0.96mm/px · 3 of 197 slices shown]
[im 66/197  soft-tissue]
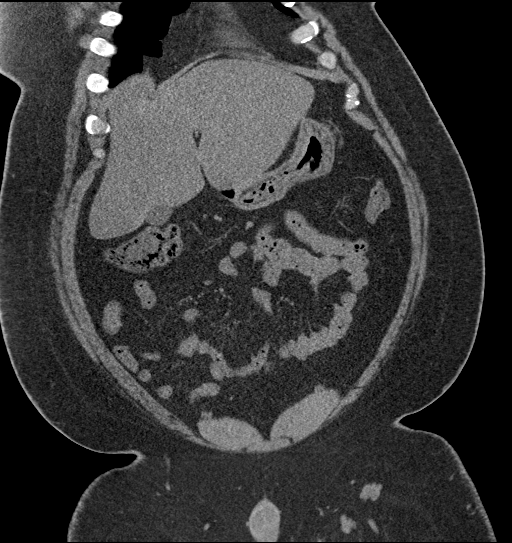
[im 88/197  soft-tissue]
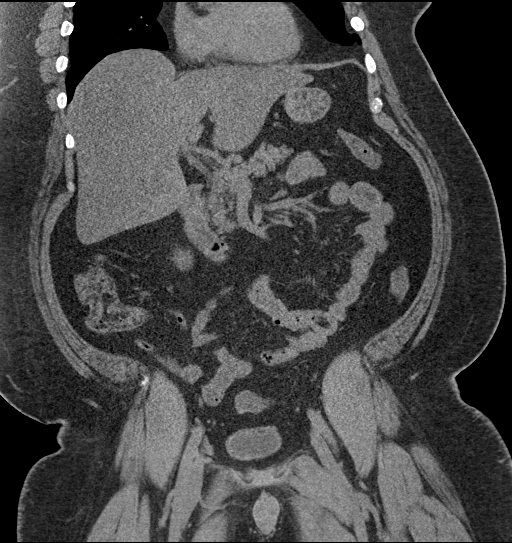
[im 109/197  soft-tissue]
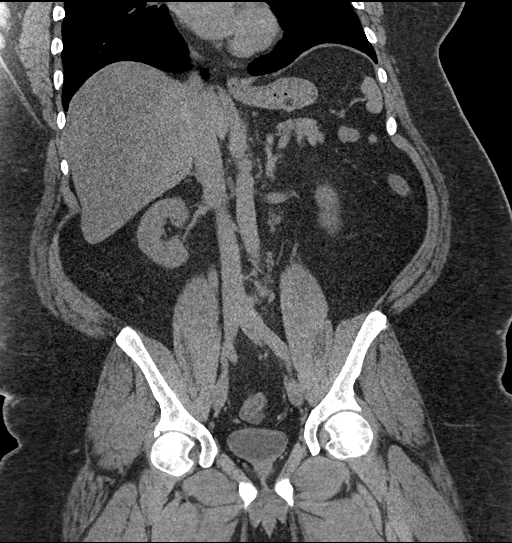

[16 of 46 positions shown; findings below may reference images not displayed]

FINDINGS: Lower chest: The visualized lung bases are grossly clear. The
visualized portions of the mediastinum are unremarkable.

Hepatobiliary: Diffuse fatty infiltration is noted within the liver.
The liver is otherwise unremarkable. The gallbladder is within
normal limits. The common bile duct remains normal in caliber.

Pancreas: The pancreas is within normal limits.

Spleen: The spleen is unremarkable in appearance.

Adrenals/Urinary Tract: The adrenal glands are unremarkable in
appearance. The kidneys are within normal limits. There is no
evidence of hydronephrosis. No renal or ureteral stones are
identified. No perinephric stranding is seen.

Stomach/Bowel: The stomach is unremarkable in appearance. The small
bowel is within normal limits. The appendix is normal in caliber,
without evidence of appendicitis. The colon is unremarkable in
appearance.

Vascular/Lymphatic: The abdominal aorta is unremarkable in
appearance. The inferior vena cava is grossly unremarkable. No
retroperitoneal lymphadenopathy is seen. No pelvic sidewall
lymphadenopathy is identified.

Reproductive: The bladder is mildly distended and grossly
unremarkable in appearance. The prostate remains normal in size.

Other: No additional soft tissue abnormalities are seen.

Musculoskeletal: No acute osseous abnormalities are identified. The
visualized musculature is unremarkable in appearance.
IMPRESSION: 1. No acute abnormality seen within the abdomen or pelvis.
2. Diffuse fatty infiltration within the liver.

## 2019-12-03 ENCOUNTER — Other Ambulatory Visit: Payer: Self-pay | Admitting: Endocrinology

## 2019-12-12 ENCOUNTER — Other Ambulatory Visit: Payer: Self-pay | Admitting: Endocrinology

## 2020-01-13 ENCOUNTER — Other Ambulatory Visit: Payer: Self-pay | Admitting: Endocrinology

## 2020-01-25 ENCOUNTER — Other Ambulatory Visit: Payer: Self-pay | Admitting: Endocrinology

## 2020-02-25 ENCOUNTER — Other Ambulatory Visit: Payer: Self-pay | Admitting: Endocrinology

## 2023-01-08 ENCOUNTER — Other Ambulatory Visit: Payer: Self-pay

## 2023-01-08 ENCOUNTER — Inpatient Hospital Stay (HOSPITAL_COMMUNITY)
Admission: EM | Admit: 2023-01-08 | Discharge: 2023-01-10 | DRG: 638 | Disposition: A | Discharge status: Home / Self Care | Payer: No Typology Code available for payment source | Attending: Internal Medicine | Admitting: Internal Medicine

## 2023-01-08 ENCOUNTER — Encounter (HOSPITAL_COMMUNITY): Payer: Self-pay | Admitting: Pharmacy Technician

## 2023-01-08 DIAGNOSIS — E1165 Type 2 diabetes mellitus with hyperglycemia: Secondary | ICD-10-CM | POA: Diagnosis present

## 2023-01-08 DIAGNOSIS — E119 Type 2 diabetes mellitus without complications: Secondary | ICD-10-CM | POA: Diagnosis not present

## 2023-01-08 DIAGNOSIS — Z7982 Long term (current) use of aspirin: Secondary | ICD-10-CM

## 2023-01-08 DIAGNOSIS — Z79899 Other long term (current) drug therapy: Secondary | ICD-10-CM

## 2023-01-08 DIAGNOSIS — Z8249 Family history of ischemic heart disease and other diseases of the circulatory system: Secondary | ICD-10-CM

## 2023-01-08 DIAGNOSIS — E1100 Type 2 diabetes mellitus with hyperosmolarity without nonketotic hyperglycemic-hyperosmolar coma (NKHHC): Secondary | ICD-10-CM | POA: Diagnosis present

## 2023-01-08 DIAGNOSIS — D509 Iron deficiency anemia, unspecified: Secondary | ICD-10-CM | POA: Diagnosis present

## 2023-01-08 DIAGNOSIS — E785 Hyperlipidemia, unspecified: Secondary | ICD-10-CM | POA: Diagnosis present

## 2023-01-08 DIAGNOSIS — E538 Deficiency of other specified B group vitamins: Secondary | ICD-10-CM | POA: Diagnosis present

## 2023-01-08 DIAGNOSIS — N1831 Chronic kidney disease, stage 3a: Secondary | ICD-10-CM | POA: Diagnosis present

## 2023-01-08 DIAGNOSIS — Z86718 Personal history of other venous thrombosis and embolism: Secondary | ICD-10-CM

## 2023-01-08 DIAGNOSIS — I129 Hypertensive chronic kidney disease with stage 1 through stage 4 chronic kidney disease, or unspecified chronic kidney disease: Secondary | ICD-10-CM | POA: Diagnosis present

## 2023-01-08 DIAGNOSIS — G4700 Insomnia, unspecified: Secondary | ICD-10-CM | POA: Diagnosis present

## 2023-01-08 DIAGNOSIS — Z6841 Body Mass Index (BMI) 40.0 and over, adult: Secondary | ICD-10-CM

## 2023-01-08 DIAGNOSIS — T50996A Underdosing of other drugs, medicaments and biological substances, initial encounter: Secondary | ICD-10-CM | POA: Diagnosis present

## 2023-01-08 DIAGNOSIS — Z794 Long term (current) use of insulin: Secondary | ICD-10-CM | POA: Diagnosis not present

## 2023-01-08 DIAGNOSIS — N179 Acute kidney failure, unspecified: Principal | ICD-10-CM | POA: Diagnosis present

## 2023-01-08 DIAGNOSIS — E039 Hypothyroidism, unspecified: Secondary | ICD-10-CM | POA: Diagnosis present

## 2023-01-08 DIAGNOSIS — Z833 Family history of diabetes mellitus: Secondary | ICD-10-CM

## 2023-01-08 DIAGNOSIS — M109 Gout, unspecified: Secondary | ICD-10-CM | POA: Insufficient documentation

## 2023-01-08 DIAGNOSIS — E1122 Type 2 diabetes mellitus with diabetic chronic kidney disease: Secondary | ICD-10-CM | POA: Diagnosis present

## 2023-01-08 DIAGNOSIS — I10 Essential (primary) hypertension: Secondary | ICD-10-CM | POA: Diagnosis present

## 2023-01-08 DIAGNOSIS — Z7985 Long-term (current) use of injectable non-insulin antidiabetic drugs: Secondary | ICD-10-CM

## 2023-01-08 DIAGNOSIS — Z7984 Long term (current) use of oral hypoglycemic drugs: Secondary | ICD-10-CM | POA: Diagnosis not present

## 2023-01-08 DIAGNOSIS — Z91128 Patient's intentional underdosing of medication regimen for other reason: Secondary | ICD-10-CM

## 2023-01-08 DIAGNOSIS — F411 Generalized anxiety disorder: Secondary | ICD-10-CM | POA: Diagnosis present

## 2023-01-08 LAB — BASIC METABOLIC PANEL
Anion gap: 12 (ref 5–15)
BUN: 21 mg/dL — ABNORMAL HIGH (ref 6–20)
CO2: 23 mmol/L (ref 22–32)
Calcium: 8.9 mg/dL (ref 8.9–10.3)
Chloride: 96 mmol/L — ABNORMAL LOW (ref 98–111)
Creatinine, Ser: 2.99 mg/dL — ABNORMAL HIGH (ref 0.61–1.24)
GFR, Estimated: 24 mL/min — ABNORMAL LOW (ref >60)
Glucose, Bld: 503 mg/dL (ref 70–99)
Potassium: 4.9 mmol/L (ref 3.5–5.1)
Sodium: 131 mmol/L — ABNORMAL LOW (ref 135–145)

## 2023-01-08 LAB — URINALYSIS, ROUTINE W REFLEX MICROSCOPIC
Bilirubin Urine: NEGATIVE
Glucose, UA: >=500 mg/dL — AB
Hgb urine dipstick: NEGATIVE
Ketones, ur: 5 mg/dL — AB
Leukocytes,Ua: NEGATIVE
Nitrite: NEGATIVE
Protein, ur: 100 mg/dL — AB
Specific Gravity, Urine: 1.016 (ref 1.005–1.030)
pH: 5 (ref 5.0–8.0)

## 2023-01-08 LAB — I-STAT VENOUS BLOOD GAS, ED
Acid-base deficit: 2 mmol/L (ref 0.0–2.0)
Bicarbonate: 23.9 mmol/L (ref 20.0–28.0)
Calcium, Ion: 1.13 mmol/L — ABNORMAL LOW (ref 1.15–1.40)
HCT: 50 % (ref 39.0–52.0)
Hemoglobin: 17 g/dL (ref 13.0–17.0)
O2 Saturation: 56 %
Potassium: 4.8 mmol/L (ref 3.5–5.1)
Sodium: 132 mmol/L — ABNORMAL LOW (ref 135–145)
TCO2: 25 mmol/L (ref 22–32)
pCO2, Ven: 42.1 mm[Hg] — ABNORMAL LOW (ref 44–60)
pH, Ven: 7.363 (ref 7.25–7.43)
pO2, Ven: 31 mm[Hg] — CL (ref 32–45)

## 2023-01-08 LAB — I-STAT CHEM 8, ED
BUN: 26 mg/dL — ABNORMAL HIGH (ref 6–20)
Calcium, Ion: 1.12 mmol/L — ABNORMAL LOW (ref 1.15–1.40)
Chloride: 100 mmol/L (ref 98–111)
Creatinine, Ser: 2.8 mg/dL — ABNORMAL HIGH (ref 0.61–1.24)
Glucose, Bld: 502 mg/dL (ref 70–99)
HCT: 49 % (ref 39.0–52.0)
Hemoglobin: 16.7 g/dL (ref 13.0–17.0)
Potassium: 4.8 mmol/L (ref 3.5–5.1)
Sodium: 132 mmol/L — ABNORMAL LOW (ref 135–145)
TCO2: 20 mmol/L — ABNORMAL LOW (ref 22–32)

## 2023-01-08 LAB — CBC
HCT: 47.7 % (ref 39.0–52.0)
Hemoglobin: 15.8 g/dL (ref 13.0–17.0)
MCH: 24.9 pg — ABNORMAL LOW (ref 26.0–34.0)
MCHC: 33.1 g/dL (ref 30.0–36.0)
MCV: 75.1 fL — ABNORMAL LOW (ref 80.0–100.0)
Platelets: 242 10*3/uL (ref 150–400)
RBC: 6.35 MIL/uL — ABNORMAL HIGH (ref 4.22–5.81)
RDW: 15.9 % — ABNORMAL HIGH (ref 11.5–15.5)
WBC: 11.8 10*3/uL — ABNORMAL HIGH (ref 4.0–10.5)
nRBC: 0 % (ref 0.0–0.2)

## 2023-01-08 LAB — CBG MONITORING, ED
Glucose-Capillary: 466 mg/dL — ABNORMAL HIGH (ref 70–99)
Glucose-Capillary: 445 mg/dL — ABNORMAL HIGH (ref 70–99)
Glucose-Capillary: 445 mg/dL — ABNORMAL HIGH (ref 70–99)
Glucose-Capillary: 440 mg/dL — ABNORMAL HIGH (ref 70–99)

## 2023-01-08 LAB — OSMOLALITY: Osmolality: 327 mosm/kg (ref 275–295)

## 2023-01-08 LAB — BETA-HYDROXYBUTYRIC ACID: Beta-Hydroxybutyric Acid: 1.24 mmol/L — ABNORMAL HIGH (ref 0.05–0.27)

## 2023-01-08 MED ORDER — DEXTROSE IN LACTATED RINGERS 5 % IV SOLN: INTRAVENOUS | Status: AC

## 2023-01-08 MED ORDER — LABETALOL HCL 5 MG/ML IV SOLN: 10.0000 mg | Freq: Three times a day (TID) | INTRAVENOUS | Status: DC | PRN

## 2023-01-08 MED ORDER — ACETAMINOPHEN 325 MG PO TABS: 650.0000 mg | ORAL_TABLET | Freq: Four times a day (QID) | ORAL | Status: DC | PRN

## 2023-01-08 MED ORDER — SODIUM CHLORIDE 0.9% FLUSH
3.0000 mL | INTRAVENOUS | Status: DC | PRN
Start: 2023-01-08 — End: 2023-01-10

## 2023-01-08 MED ORDER — DEXTROSE 50 % IV SOLN: 0.0000 mL | INTRAVENOUS | Status: DC | PRN

## 2023-01-08 MED ORDER — INSULIN ASPART 100 UNIT/ML IJ SOLN: 5.0000 [IU] | Freq: Three times a day (TID) | INTRAMUSCULAR | Status: DC

## 2023-01-08 MED ORDER — INSULIN ASPART 100 UNIT/ML IJ SOLN
8.0000 [IU] | Freq: Once | INTRAMUSCULAR | Status: AC
  Administered 2023-01-08: 8 [IU] via SUBCUTANEOUS

## 2023-01-08 MED ORDER — ASPIRIN 81 MG PO CHEW
81.0000 mg | CHEWABLE_TABLET | Freq: Every day | ORAL | Status: DC
  Administered 2023-01-09 – 2023-01-10 (×2): 81 mg via ORAL
  Filled 2023-01-08 (×2): qty 1

## 2023-01-08 MED ORDER — SODIUM CHLORIDE 0.9 % IV SOLN: 250.0000 mL | INTRAVENOUS | Status: AC | PRN

## 2023-01-08 MED ORDER — ENOXAPARIN SODIUM 40 MG/0.4ML IJ SOSY: 40.0000 mg | PREFILLED_SYRINGE | INTRAMUSCULAR | Status: DC

## 2023-01-08 MED ORDER — TRAZODONE HCL 50 MG PO TABS: 350.0000 mg | ORAL_TABLET | Freq: Every evening | ORAL | Status: DC | PRN

## 2023-01-08 MED ORDER — INSULIN REGULAR(HUMAN) IN NACL 100-0.9 UT/100ML-% IV SOLN
INTRAVENOUS | Status: AC
  Administered 2023-01-09: 5 [IU]/h via INTRAVENOUS
  Filled 2023-01-08: qty 100

## 2023-01-08 MED ORDER — ACETAMINOPHEN 650 MG RE SUPP: 650.0000 mg | Freq: Four times a day (QID) | RECTAL | Status: DC | PRN

## 2023-01-08 MED ORDER — ALLOPURINOL 100 MG PO TABS: 300.0000 mg | ORAL_TABLET | Freq: Every day | ORAL | Status: DC | PRN

## 2023-01-08 MED ORDER — HEPARIN SODIUM (PORCINE) 5000 UNIT/ML IJ SOLN
5000.0000 [IU] | Freq: Three times a day (TID) | INTRAMUSCULAR | Status: DC
  Administered 2023-01-08 – 2023-01-10 (×5): 5000 [IU] via SUBCUTANEOUS
  Filled 2023-01-08 (×5): qty 1

## 2023-01-08 MED ORDER — LACTATED RINGERS IV SOLN: INTRAVENOUS | Status: DC

## 2023-01-08 MED ORDER — ONDANSETRON HCL 4 MG PO TABS: 4.0000 mg | ORAL_TABLET | Freq: Four times a day (QID) | ORAL | Status: DC | PRN

## 2023-01-08 MED ORDER — INSULIN ASPART 100 UNIT/ML IJ SOLN: 0.0000 [IU] | Freq: Every day | INTRAMUSCULAR | Status: DC

## 2023-01-08 MED ORDER — INSULIN GLARGINE-YFGN 100 UNIT/ML ~~LOC~~ SOLN
15.0000 [IU] | Freq: Every day | SUBCUTANEOUS | Status: DC
  Administered 2023-01-08: 15 [IU] via SUBCUTANEOUS
  Filled 2023-01-08: qty 0.15

## 2023-01-08 MED ORDER — ONDANSETRON HCL 4 MG/2ML IJ SOLN: 4.0000 mg | Freq: Four times a day (QID) | INTRAMUSCULAR | Status: DC | PRN

## 2023-01-08 MED ORDER — SODIUM CHLORIDE 0.9 % IV BOLUS
1000.0000 mL | Freq: Once | INTRAVENOUS | Status: AC
  Administered 2023-01-08: 1000 mL via INTRAVENOUS

## 2023-01-08 MED ORDER — POTASSIUM CHLORIDE 10 MEQ/100ML IV SOLN
10.0000 meq | INTRAVENOUS | Status: AC
  Administered 2023-01-09 (×2): 10 meq via INTRAVENOUS
  Filled 2023-01-08 (×2): qty 100

## 2023-01-08 MED ORDER — LACTATED RINGERS IV BOLUS
20.0000 mL/kg | Freq: Once | INTRAVENOUS | Status: AC
  Administered 2023-01-09: 2894 mL via INTRAVENOUS

## 2023-01-08 MED ORDER — SIMVASTATIN 20 MG PO TABS
40.0000 mg | ORAL_TABLET | Freq: Every day | ORAL | Status: DC
  Administered 2023-01-08 – 2023-01-10 (×3): 40 mg via ORAL
  Filled 2023-01-08 (×3): qty 2

## 2023-01-08 MED ORDER — INSULIN ASPART 100 UNIT/ML IJ SOLN: 4.0000 [IU] | Freq: Three times a day (TID) | INTRAMUSCULAR | Status: DC

## 2023-01-08 MED ORDER — SENNOSIDES-DOCUSATE SODIUM 8.6-50 MG PO TABS: 1.0000 | ORAL_TABLET | Freq: Every evening | ORAL | Status: DC | PRN

## 2023-01-08 MED ORDER — INSULIN ASPART 100 UNIT/ML IJ SOLN: 0.0000 [IU] | Freq: Three times a day (TID) | INTRAMUSCULAR | Status: DC

## 2023-01-08 MED ORDER — INSULIN ASPART 100 UNIT/ML IJ SOLN
5.0000 [IU] | Freq: Three times a day (TID) | INTRAMUSCULAR | Status: DC
  Administered 2023-01-08: 5 [IU] via SUBCUTANEOUS

## 2023-01-08 MED ORDER — FERROUS SULFATE 325 (65 FE) MG PO TABS
325.0000 mg | ORAL_TABLET | Freq: Every day | ORAL | Status: DC
  Administered 2023-01-09: 325 mg via ORAL
  Filled 2023-01-08 (×2): qty 1

## 2023-01-08 MED ORDER — AMLODIPINE BESYLATE 10 MG PO TABS
10.0000 mg | ORAL_TABLET | Freq: Every day | ORAL | Status: DC
  Administered 2023-01-08 – 2023-01-10 (×3): 10 mg via ORAL
  Filled 2023-01-08: qty 1
  Filled 2023-01-08 (×2): qty 2

## 2023-01-08 MED ORDER — LACTATED RINGERS IV SOLN: INTRAVENOUS | Status: AC

## 2023-01-08 MED ORDER — LEVOTHYROXINE SODIUM 25 MCG PO TABS: 50.0000 ug | ORAL_TABLET | Freq: Every day | ORAL | Status: DC

## 2023-01-08 MED ORDER — SODIUM CHLORIDE 0.9% FLUSH
3.0000 mL | Freq: Two times a day (BID) | INTRAVENOUS | Status: DC
  Administered 2023-01-08 – 2023-01-10 (×4): 3 mL via INTRAVENOUS

## 2023-01-08 MED ORDER — BUPROPION HCL ER (XL) 150 MG PO TB24
300.0000 mg | ORAL_TABLET | Freq: Every morning | ORAL | Status: DC
  Administered 2023-01-09 – 2023-01-10 (×2): 300 mg via ORAL
  Filled 2023-01-08 (×2): qty 2

## 2023-01-08 NOTE — ED Provider Notes (Signed)
[place] EMERGENCY DEPARTMENT AT [facility] Provider Note   CSN: [id number] Arrival date & time: 01/08/23  1235     History   Parker West is a 52 y.o. male past medical history of T2DM, HTN, HLD, gout presents to ED today for hyperglycemia.  Patient reports that he has been feeling tired and fatigued for the past 2 days, with dizziness and lightheadedness for the past week.  He states that he has been drinking a lot of water lately and has been feeling thirsty, reports increase in frequency of urination at night especially.  He denies checking his blood sugar at home, he reports he was seen at the [place] over a month ago and they told him that his A1c was at 6.5.  Patient reports that he takes Ozempic 1 mg weekly.  He is not taking Jardiance, reports he forgets to take this medication.  He is not on any other diabetic medications.  Patient reports that the big thing that he has made over the course of a month, is changing his diet.  Patient reports that he has been drinking smoothie from the tropical caf daily for the past month, he reports that he found out yesterday that it contains lots of sugar.  He also endorses symptoms of intermittent shortness of breath with exertion, especially when he is out.  He also reports burning sensation for the past week, mostly associated with forward and it is worse when laying down, he reports this epigastric pain during the burning sensation episode.  Otherwise he denies any fever, chills, congestion, sore throat, cough, chest pain, diarrhea, nausea, vomiting, dysuria, headaches, tremors.    Home Medications Prior to Admission medications   Medication Sig Start Date End Date Taking? Authorizing Provider  allopurinol (ZYLOPRIM) 300 MG tablet Take 300 mg by mouth daily as needed (gout).     [provider]  amLODipine (NORVASC) 5 MG tablet Take 1 tablet (5 mg total) by mouth daily. 08/12/19   Reather Littler, MD  aspirin 81 MG chewable  tablet Chew 81 mg by mouth daily.    [provider]  Blood Glucose Monitoring Suppl (ONETOUCH VERIO FLEX SYSTEM) w/Device KIT Use as instructed to check blood sugar twice daily. 06/16/19   Reather Littler, MD  glucose blood test strip Use OneTouch verio test strips to check blood sugar twice daily. 01/07/19   Reather Littler, MD  insulin lispro (HUMALOG KWIKPEN) 100 UNIT/ML KwikPen Inject 10 units under the skin once daily.(Takes only for blood sugar over 140) 06/16/19   Reather Littler, MD  Insulin Pen Needle 31G X 5 MM MISC Use twice a day on insulin pen 06/16/19   Reather Littler, MD  insulin starter kit- syringes MISC 1 kit by Other route once. 10/31/14   Hollice Espy, MD  Lancets [facility] ULTRASOFT) lancets Use as instructed to check blood sugar 3 times a day 06/16/19   Reather Littler, MD  losartan (COZAAR) 100 MG tablet Take 100 mg by mouth daily.    [provider]  simvastatin (ZOCOR) 40 MG tablet Take 40 mg by mouth daily.    [provider]  traZODone (DESYREL) 100 MG tablet Take 400 mg by mouth at bedtime as needed for sleep.     [provider]  TRULICITY 1.5 MG/0.5ML SOPN INJECT 1.5 MG INTO THE SKIN ONCE A WEEK. REPLACES OZEMPIC 01/14/20   Reather Littler, MD  TRULICITY 1.5 MG/0.5ML SOPN INJECT 1.5 MG INTO THE SKIN ONCE A WEEK.  REPLACES OZEMPIC 01/14/20   Reather Littler, MD      Allergies    Patient has no known allergies.    Review of Systems   Review of Systems  Constitutional:  Positive for fatigue. Negative for chills and fever.  HENT:  Negative for congestion, rhinorrhea and sore throat.   Respiratory:  Positive for shortness of breath. Negative for cough and chest tightness.   Cardiovascular:  Negative for chest pain.  Gastrointestinal:  Positive for abdominal pain. Negative for blood in stool, constipation, diarrhea, nausea and vomiting.  Endocrine: Positive for polydipsia and polyuria.  Genitourinary:  Positive for frequency. Negative for dysuria.   Musculoskeletal:  Negative for arthralgias.  Neurological:  Positive for dizziness and light-headedness. Negative for tremors and headaches.    Physical Exam Updated Vital Signs BP 116/76   Pulse 98   Temp (!) 97.5 F (36.4 C)   Resp 14   SpO2 100%  Physical Exam  General: Obese, laying comfortably in bed, no acute distress Cardiovascular: Regular rate Pulmonary: Lungs are clear to auscultation, no wheezing or crackles, normal work of breathing Abdomen: Discomfort noted at epigastric region, though no tenderness to palpation, bowel sounds present MSK: Range of motion intact Neuro: Alert, no focal deficits.  ED Results / Procedures / Treatments   Labs (all labs ordered are listed, but only abnormal results are displayed) Labs Reviewed  CBC - Abnormal; Notable for the following components:      Result Value   WBC 11.8 (*)    RBC 6.35 (*)    MCV 75.1 (*)    MCH 24.9 (*)    RDW 15.9 (*)    All other components within normal limits  URINALYSIS, ROUTINE W REFLEX MICROSCOPIC - Abnormal; Notable for the following components:   Glucose, UA >=500 (*)    Ketones, ur 5 (*)    Protein, ur 100 (*)    Bacteria, UA RARE (*)    All other components within normal limits  BASIC METABOLIC PANEL - Abnormal; Notable for the following components:   Sodium 131 (*)    Chloride 96 (*)    Glucose, Bld 503 (*)    BUN 21 (*)    Creatinine, Ser 2.99 (*)    GFR, Estimated 24 (*)    All other components within normal limits  BETA-HYDROXYBUTYRIC ACID - Abnormal; Notable for the following components:   Beta-Hydroxybutyric Acid 1.24 (*)    All other components within normal limits  CBG MONITORING, ED - Abnormal; Notable for the following components:   Glucose-Capillary 466 (*)    All other components within normal limits  CBG MONITORING, ED - Abnormal; Notable for the following components:   Glucose-Capillary 445 (*)    All other components within normal limits  I-STAT CHEM 8, ED - Abnormal;  Notable for the following components:   Sodium 132 (*)    BUN 26 (*)    Creatinine, Ser 2.80 (*)    Glucose, Bld 502 (*)    Calcium, Ion 1.12 (*)    TCO2 20 (*)    All other components within normal limits  I-STAT VENOUS BLOOD GAS, ED - Abnormal; Notable for the following components:   pCO2, Ven 42.1 (*)    pO2, Ven 31 (*)    Sodium 132 (*)    Calcium, Ion 1.13 (*)    All other components within normal limits  CBG MONITORING, ED    EKG None  Radiology No results found.  Procedures None  Medications Ordered in ED Medications  sodium chloride 0.9 % bolus 1,000 mL (1,000 mLs Intravenous New Bag/Given 01/08/23 2028)  insulin aspart (novoLOG) injection 8 Units (8 Units Subcutaneous Given 01/08/23 2031)    ED Course/ Medical Decision Making/ A&P                                 Medical Decision Making Amount and/or Complexity of Data Reviewed Labs: ordered.  Risk Prescription drug management.   This patient presents to the ED for concern of hyperglycemia, this involves an extensive number of treatment options, and is a complaint that carries with it a high risk of complications and morbidity.  The differential diagnosis includes DKA, HHS, anion gap metabolic acidosis, AKI.    Co morbidities that complicate the patient evaluation  As per HPI   Additional history obtained:  Additional history obtained from chart review External records from outside source obtained and reviewed including none  Lab Tests:  I Ordered, and personally interpreted labs.  The pertinent results include: Sodium 131, chloride 96, glucose 503, BUN 21, creatinine 2.99, GFR 24, WBC 11.8, hemoglobin normal, platelet normal, beta hydroxybutyric acid 1.24, UA showed >500 gluc, Ketones, protein, rare bacteria.   Imaging Studies ordered:  No imaging ordered  Cardiac Monitoring: / EKG:  The patient was maintained on a cardiac monitor.  I personally viewed and interpreted the cardiac monitored  which showed an underlying rhythm of: Sinus tachycardia, artifacts.   Consultations Obtained:  None  Problem List / ED Course / Critical interventions / Medication management  AKI, Hyperglycemia  I ordered medication including 1L NS, insulin Aspart 8 units for hyperglycemia and AKI   Reevaluation pending I have reviewed the patients home medicines and have made adjustments as needed  Social Determinants of Health:  As per HPI   Test / Admission - Considered:  This 52 year old male with past medical history of hypertension, hyperlipidemia, type 2 diabetes not on insulin presents to ED today for hyperglycemia.  Patient reports symptoms of fatigue for the past 2 days with dizziness and lightheadedness for the past week.  He also endorses symptoms of polyuria and polydipsia.  Patient reports that over the course of the month he has been drinking smoothie from a tropical caf daily.  Otherwise denies any fever, chills, congestion, sore throat, chest pain, nausea, vomiting, diarrhea, dysuria. Labs showed glucose of 503, normal anion gap, normal bicarb, sodium 131, UA showed no significant glucosuria, ketones, protein.  With normal anion gap and normal bicarb, unlikely DKA.  Creatinine elevated 2.99 from 2.80; per chart review, baseline seems to be around 1.6, concerning for acute kidney injury.  Patient needs to be admitted for further management for AKI.   Final Clinical Impression(s) / ED Diagnoses Final diagnoses:  AKI (acute kidney injury) (HCC)  Type 2 diabetes mellitus with hyperglycemia, unspecified whether long term insulin use [facility])    Rx / DC Orders ED Discharge Orders     None         Colvin Blatt, DO 01/08/23 2102    Horton, Clabe Seal, DO 01/08/23 2358

## 2023-01-08 NOTE — ED Notes (Signed)
ED TO INPATIENT HANDOFF REPORT  ED Nurse Name and Phone #: Dahlia Client [phone]  S Name/Age/Gender Parker West 52 y.o. male Room/Bed: 002C/002C  Code Status   Code Status: Full Code  Home/SNF/Other Home Patient oriented to: self, place, time, and situation Is this baseline? Yes   Triage Complete: Triage complete  Chief Complaint Hyperglycemic crisis due to diabetes mellitus [facility]) [E11.65]  Triage Note Pt presents to the ED with reports of feeling fatigued for the last several days. Pt wife took his blood sugar yesterday and it read HI. Today pt checked his blood sugar and it was still over 400.   Allergies Allergies  Allergen Reactions   Lisinopril     Other Reaction(s): Cough    Level of Care/Admitting Diagnosis ED Disposition     ED Disposition  Admit   Condition  --   Comment  Hospital Area: [facility] [[id number]]  Level of Care: Telemetry Medical [104]  May admit patient to [facility] or [facility] if equivalent level of care is available:: No  Covid Evaluation: Asymptomatic - no recent exposure (last 10 days) testing not required  Diagnosis: Hyperglycemic crisis due to diabetes mellitus [facility]) [[id number]]  Admitting Physician: Tereasa Coop [[id number]]  Attending Physician: Tereasa Coop [[id number]]  Certification:: I certify this patient will need inpatient services for at least 2 midnights  Expected Medical Readiness: 01/13/2023          B Medical/Surgery History Past Medical History:  Diagnosis Date   Anginal pain (HCC)    Diabetes mellitus without complication (HCC)    Dyspnea 10/09/2011   At rest  Recent DVT    Gout    Heart murmur    "when I was small"   Hematochezia 10/09/2011   Low grade antedated DVT   Hyperlipidemia    Hypertension    Microcytic normochromic anemia 10/09/2011   retic 1.6% Hb 9.9 10/09/11   Obesity    Pneumonia 1988   "walking"   Staph infection 09/04/11   BLE/wife's report   Past Surgical History:   Procedure Laterality Date   EYE SURGERY     stye excision   REDUCTION MAMMAPLASTY  1990's     A IV Location/Drains/Wounds Patient Lines/Drains/Airways Status     Active Line/Drains/Airways     Name Placement date Placement time Site Days   Peripheral IV 01/08/23 20 G Anterior;Distal;Left;Upper Arm 01/08/23  1853  Arm  less than 1   Incision (Closed) 08/30/17 Axilla Right 08/30/17  0429  -- 1957            Intake/Output Last 24 hours  Intake/Output Summary (Last 24 hours) at 01/08/2023 2204 Last data filed at 01/08/2023 2203 Gross per 24 hour  Intake 1003 ml  Output --  Net 1003 ml    Labs/Imaging Results for orders placed or performed during the hospital encounter of 01/08/23 (from the past 48 hour(s))  CBG monitoring, ED     Status: Abnormal   Collection Time: 01/08/23 12:45 PM  Result Value Ref Range   Glucose-Capillary 466 (H) 70 - 99 mg/dL    Comment: Glucose reference range applies only to samples taken after fasting for at least 8 hours.  CBC     Status: Abnormal   Collection Time: 01/08/23  1:13 PM  Result Value Ref Range   WBC 11.8 (H) 4.0 - 10.5 K/uL    Comment: WHITE COUNT CONFIRMED ON SMEAR   RBC 6.35 (H) 4.22 - 5.81 MIL/uL   Hemoglobin 15.8  13.0 - 17.0 g/dL   HCT 16.1 09.6 - 04.5 %   MCV 75.1 (L) 80.0 - 100.0 fL   MCH 24.9 (L) 26.0 - 34.0 pg   MCHC 33.1 30.0 - 36.0 g/dL   RDW 40.9 (H) 81.1 - 91.4 %   Platelets 242 150 - 400 K/uL    Comment: REPEATED TO VERIFY PLATELET COUNT CONFIRMED BY SMEAR    nRBC 0.0 0.0 - 0.2 %    Comment: Performed at [facility], [address], [place], [place] [address]  I-stat chem 8, ed     Status: Abnormal   Collection Time: 01/08/23  1:24 PM  Result Value Ref Range   Sodium 132 (L) 135 - 145 mmol/L   Potassium 4.8 3.5 - 5.1 mmol/L   Chloride 100 98 - 111 mmol/L   BUN 26 (H) 6 - 20 mg/dL   Creatinine, Ser 6.21 (H) 0.61 - 1.24 mg/dL   Glucose, Bld 308 (HH) 70 - 99 mg/dL    Comment: Glucose reference range  applies only to samples taken after fasting for at least 8 hours.   Calcium, Ion 1.12 (L) 1.15 - 1.40 mmol/L   TCO2 20 (L) 22 - 32 mmol/L   Hemoglobin 16.7 13.0 - 17.0 g/dL   HCT 65.7 84.6 - 96.2 %   Comment NOTIFIED PHYSICIAN   Urinalysis, Routine w reflex microscopic -Urine, Clean Catch     Status: Abnormal   Collection Time: 01/08/23  2:20 PM  Result Value Ref Range   Color, Urine YELLOW YELLOW   APPearance CLEAR CLEAR   Specific Gravity, Urine 1.016 1.005 - 1.030   pH 5.0 5.0 - 8.0   Glucose, UA >=500 (A) NEGATIVE mg/dL   Hgb urine dipstick NEGATIVE NEGATIVE   Bilirubin Urine NEGATIVE NEGATIVE   Ketones, ur 5 (A) NEGATIVE mg/dL   Protein, ur 952 (A) NEGATIVE mg/dL   Nitrite NEGATIVE NEGATIVE   Leukocytes,Ua NEGATIVE NEGATIVE   RBC / HPF 0-5 0 - 5 RBC/hpf   WBC, UA 0-5 0 - 5 WBC/hpf   Bacteria, UA RARE (A) NONE SEEN   Squamous Epithelial / HPF 0-5 0 - 5 /HPF   Mucus PRESENT     Comment: Performed at [facility], [address], [place], [place] [address]  CBG monitoring, ED     Status: Abnormal   Collection Time: 01/08/23  5:38 PM  Result Value Ref Range   Glucose-Capillary 445 (H) 70 - 99 mg/dL    Comment: Glucose reference range applies only to samples taken after fasting for at least 8 hours.  Basic metabolic panel     Status: Abnormal   Collection Time: 01/08/23  6:45 PM  Result Value Ref Range   Sodium 131 (L) 135 - 145 mmol/L   Potassium 4.9 3.5 - 5.1 mmol/L   Chloride 96 (L) 98 - 111 mmol/L   CO2 23 22 - 32 mmol/L   Glucose, Bld 503 (HH) 70 - 99 mg/dL    Comment: CRITICAL RESULT CALLED TO, READ BACK BY AND VERIFIED WITH H Marjon Doxtater RN 01/08/2023 1949 BNUNNERY Glucose reference range applies only to samples taken after fasting for at least 8 hours.    BUN 21 (H) 6 - 20 mg/dL   Creatinine, Ser 4.40 (H) 0.61 - 1.24 mg/dL   Calcium 8.9 8.9 - 10.2 mg/dL   GFR, Estimated 24 (L) >60 mL/min    Comment: (NOTE) Calculated using the CKD-EPI Creatinine Equation  (2021)    Anion  gap 12 5 - 15    Comment: Performed at [facility], [address] [place], [place] [address]  Beta-hydroxybutyric acid     Status: Abnormal   Collection Time: 01/08/23  6:45 PM  Result Value Ref Range   Beta-Hydroxybutyric Acid 1.24 (H) 0.05 - 0.27 mmol/L    Comment: Performed at [facility], [address] [place], [place] [address]  I-Stat venous blood gas, [facility] ED, [facility], [facility])     Status: Abnormal   Collection Time: 01/08/23  8:39 PM  Result Value Ref Range   pH, Ven 7.363 7.25 - 7.43   pCO2, Ven 42.1 (L) 44 - 60 mmHg   pO2, Ven 31 (LL) 32 - 45 mmHg   Bicarbonate 23.9 20.0 - 28.0 mmol/L   TCO2 25 22 - 32 mmol/L   O2 Saturation 56 %   Acid-base deficit 2.0 0.0 - 2.0 mmol/L   Sodium 132 (L) 135 - 145 mmol/L   Potassium 4.8 3.5 - 5.1 mmol/L   Calcium, Ion 1.13 (L) 1.15 - 1.40 mmol/L   HCT 50.0 39.0 - 52.0 %   Hemoglobin 17.0 13.0 - 17.0 g/dL   Sample type VENOUS    Comment NOTIFIED PHYSICIAN   Osmolality     Status: Abnormal   Collection Time: 01/08/23  9:02 PM  Result Value Ref Range   Osmolality 327 (HH) 275 - 295 mOsm/kg    Comment: REPEATED TO VERIFY CRITICAL RESULT CALLED TO, READ BACK BY AND VERIFIED WITH: Kevan Prouty,H RN 01/08/2023 AT 2143 SKEEN,P Performed at [facility] Lab, [address] [place], [place] [address]   CBG monitoring, ED     Status: Abnormal   Collection Time: 01/08/23  9:34 PM  Result Value Ref Range   Glucose-Capillary 445 (H) 70 - 99 mg/dL    Comment: Glucose reference range applies only to samples taken after fasting for at least 8 hours.   No results found.  Pending Labs Unresulted Labs (From admission, onward)     Start     Ordered   01/09/23 0500  CBC  Tomorrow morning,   R        01/08/23 2130   01/09/23 0500  Comprehensive metabolic panel  Tomorrow morning,   R        01/08/23 2130   01/09/23 0200  Beta-hydroxybutyric acid  Tomorrow morning,   R        01/08/23 2140   01/08/23 2154  Vitamin B12   (Anemia Panel (PNL))  Add-on,   AD        01/08/23 2153   01/08/23 2154  Folate  (Anemia Panel (PNL))  Add-on,   AD        01/08/23 2153   01/08/23 2154  Iron and TIBC  (Anemia Panel (PNL))  Add-on,   AD        01/08/23 2153   01/08/23 2154  Ferritin  (Anemia Panel (PNL))  Add-on,   AD        01/08/23 2153   01/08/23 2154  Reticulocytes  (Anemia Panel (PNL))  Add-on,   AD        01/08/23 2153   01/08/23 2154  Occult blood card to lab, stool  Once,   R        01/08/23 2154   01/08/23 2145  HIV Antibody (routine testing w rflx)  Once,   R        01/08/23 2145   01/08/23 2132  Hemoglobin  A1c  Once,   R       Comments: To assess prior glycemic control    01/08/23 2133            Vitals/Pain Today's Vitals   01/08/23 2034 01/08/23 2045 01/08/23 2100 01/08/23 2132  BP: 116/76 (!) 126/92 119/84 133/87  Pulse: 98 (!) 132 (!) 104 (!) 104  Resp: 14 (!) 25 13 (!) 22  Temp:    97.9 F (36.6 C)  TempSrc:    Oral  SpO2: 100% 93% 100% 100%  PainSc:        Isolation Precautions No active isolations  Medications Medications  allopurinol (ZYLOPRIM) tablet 300 mg (has no administration in time range)  aspirin chewable tablet 81 mg (has no administration in time range)  amLODipine (NORVASC) tablet 10 mg (has no administration in time range)  simvastatin (ZOCOR) tablet 40 mg (has no administration in time range)  buPROPion (WELLBUTRIN XL) 24 hr tablet 300 mg (has no administration in time range)  traZODone (DESYREL) tablet 400 mg (has no administration in time range)  levothyroxine (SYNTHROID) tablet 50 mcg (has no administration in time range)  sodium chloride flush (NS) 0.9 % injection 3 mL (3 mLs Intravenous Given 01/08/23 2203)  sodium chloride flush (NS) 0.9 % injection 3 mL (has no administration in time range)  0.9 %  sodium chloride infusion (has no administration in time range)  lactated ringers infusion (has no administration in time range)  acetaminophen (TYLENOL) tablet  650 mg (has no administration in time range)    Or  acetaminophen (TYLENOL) suppository 650 mg (has no administration in time range)  ondansetron (ZOFRAN) tablet 4 mg (has no administration in time range)    Or  ondansetron (ZOFRAN) injection 4 mg (has no administration in time range)  senna-docusate (Senokot-S) tablet 1 tablet (has no administration in time range)  labetalol (NORMODYNE) injection 10 mg (has no administration in time range)  insulin glargine-yfgn (SEMGLEE) injection 15 Units (has no administration in time range)  insulin aspart (novoLOG) injection 0-6 Units (has no administration in time range)  insulin aspart (novoLOG) injection 0-5 Units (has no administration in time range)  insulin aspart (novoLOG) injection 5 Units (5 Units Subcutaneous Given 01/08/23 2202)  heparin injection 5,000 Units (has no administration in time range)  ferrous sulfate tablet 325 mg (has no administration in time range)  sodium chloride 0.9 % bolus 1,000 mL (0 mLs Intravenous Stopped 01/08/23 2203)  insulin aspart (novoLOG) injection 8 Units (8 Units Subcutaneous Given 01/08/23 2031)    Mobility walks     Focused Assessments     R Recommendations: See Admitting Provider Note  Report given to:   Additional Notes:

## 2023-01-08 NOTE — ED Notes (Signed)
PT placement called.  They advised the Pt was rejected due to the frequency of CBGs.  Dr. Janalyn Shy notified of this via secure chat.

## 2023-01-08 NOTE — ED Notes (Signed)
Pt. Up to restroom with steady even gait

## 2023-01-08 NOTE — H&P (Incomplete)
History and Physical    Parker West:[id number] DOB: Jan 22, 1971 DOA: 01/08/2023  PCP: [facility]   Patient coming from: Home   Chief Complaint: No chief complaint on file.  ED TRIAGE note:  HPI:  Parker West is a 52 y.o. male with medical history significant of DM type 2 insulin-dependent, poorly controlled, CKD stage IIIa and essential hypertension presented to emergency department for evaluation for elevated blood glucose.  Patient reported that she has been feeling tired and fatigued for last 2 days with dizziness and lightheadedness past past 1 week. He states that he has been drinking a lot of water lately and has been feeling thirsty, reports increase in frequency of urination at night especially.  He denies checking his blood sugar at home, he reports he was seen at the [place] over a month ago and they told him that his A1c was at 6.5.  Patient reports that he takes Ozempic 1 mg weekly.  He is not taking Jardiance, reports he forgets to take this medication.  He is not on any other diabetic medications.  Patient reports that the big thing that he has made over the course of a month, is changing his diet.  Patient reports that he has been drinking smoothie from the [employer] daily for the past month, he reports that he found out yesterday that it contains lots of sugar.  He also endorses symptoms of intermittent shortness of breath with exertion, especially when he is out.  He also reports burning sensation for the past week, mostly associated with forward and it is worse when laying down, he reports this epigastric pain during the burning sensation episode.  Otherwise he denies any fever, chills, congestion, sore throat, cough, chest pain, diarrhea, nausea, vomiting, dysuria, headaches, tremors.    ED Course:  Presentation to ED patient is hemodynamically stable except having tachycardia heart rate between 103 to 132. POC blood glucose 466. Chem-7 showed low sodium  corrected sodium 136, potassium 4.8, chloride 100, BUN 26, elevated creatinine 2.8, elevated blood glucose 500. UA blood glucose 500, ketone positive, protein 100 and rare bacteria. VBG showing pH 7.3, pCO2 42, pO2 32, normal acid-base deficiency at 2, bicarb 25, low calcium 1.13 and sodium 132. Elevated beta-hydroxybutyrate level 1.24. Pending serum osmolarity. CBC showing slightly elevated WBC, 11.8, RBC 6.35, hemoglobin 15.8, hematocrit 47.7, MCV 75, and platelet count 248. In the ED patient was given aspart 8 units and 1 L of LR bolus.   Hospitalist has been contacted for monitor management of hyperglycemia.  Review of Systems:  Review of Systems  Constitutional:  Negative for chills, fever, malaise/fatigue and weight loss.  Respiratory:  Negative for cough and shortness of breath.   Cardiovascular:  Negative for chest pain and palpitations.  Gastrointestinal:  Negative for abdominal pain, diarrhea, heartburn, nausea and vomiting.  Genitourinary:  Negative for dysuria and urgency.    Past Medical History:  Diagnosis Date   Anginal pain (HCC)    Diabetes mellitus without complication (HCC)    Dyspnea 10/09/2011   At rest  Recent DVT    Gout    Heart murmur    "when I was small"   Hematochezia 10/09/2011   Low grade antedated DVT   Hyperlipidemia    Hypertension    Microcytic normochromic anemia 10/09/2011   retic 1.6% Hb 9.9 10/09/11   Obesity    Pneumonia 1988   "walking"   Staph infection 09/04/11   BLE/wife's report  Past Surgical History:  Procedure Laterality Date   EYE SURGERY     stye excision   REDUCTION MAMMAPLASTY  1990's     reports that he has never smoked. He has never used smokeless tobacco. He reports current alcohol use. He reports that he does not use drugs.  Allergies  Allergen Reactions   Lisinopril     Other Reaction(s): Cough    Family History  Problem Relation Age of Onset   Stroke Mother    Hypertension Mother    Diabetes Mother     Breast cancer Mother    Hypertension Father    Ulcers Father        peptic   Diabetes Father    Colon cancer Maternal Aunt     Prior to Admission medications   Medication Sig Start Date End Date Taking? Authorizing Provider  allopurinol (ZYLOPRIM) 300 MG tablet Take 300 mg by mouth daily as needed (gout).     [provider]  amLODipine (NORVASC) 5 MG tablet Take 1 tablet (5 mg total) by mouth daily. 08/12/19   Reather Littler, MD  aspirin 81 MG chewable tablet Chew 81 mg by mouth daily.    [provider]  Blood Glucose Monitoring Suppl (ONETOUCH VERIO FLEX SYSTEM) w/Device KIT Use as instructed to check blood sugar twice daily. 06/16/19   Reather Littler, MD  glucose blood test strip Use OneTouch verio test strips to check blood sugar twice daily. 01/07/19   Reather Littler, MD  insulin lispro (HUMALOG KWIKPEN) 100 UNIT/ML KwikPen Inject 10 units under the skin once daily.(Takes only for blood sugar over 140) 06/16/19   Reather Littler, MD  Insulin Pen Needle 31G X 5 MM MISC Use twice a day on insulin pen 06/16/19   Reather Littler, MD  insulin starter kit- syringes MISC 1 kit by Other route once. 10/31/14   Hollice Espy, MD  Lancets [facility] ULTRASOFT) lancets Use as instructed to check blood sugar 3 times a day 06/16/19   Reather Littler, MD  losartan (COZAAR) 100 MG tablet Take 100 mg by mouth daily.    [provider]  simvastatin (ZOCOR) 40 MG tablet Take 40 mg by mouth daily.    [provider]  traZODone (DESYREL) 100 MG tablet Take 400 mg by mouth at bedtime as needed for sleep.     [provider]  TRULICITY 1.5 MG/0.5ML SOPN INJECT 1.5 MG INTO THE SKIN ONCE A WEEK. REPLACES OZEMPIC 01/14/20   Reather Littler, MD  TRULICITY 1.5 MG/0.5ML SOPN INJECT 1.5 MG INTO THE SKIN ONCE A WEEK. REPLACES OZEMPIC 01/14/20   Reather Littler, MD     Physical Exam: Vitals:   01/08/23 2100 01/08/23 2132 01/08/23 2200 01/08/23 2215  BP: 119/84 133/87 121/89 (!) 114/92  Pulse: (!)  104 (!) 104 (!) 117 (!) 123  Resp: 13 (!) 22 15 (!) 22  Temp:  97.9 F (36.6 C)    TempSrc:  Oral    SpO2: 100% 100% 100% 97%    Physical Exam   Labs on Admission: I have personally reviewed following labs and imaging studies  CBC: Recent Labs  Lab 01/08/23 1313 01/08/23 1324 01/08/23 2039  WBC 11.8*  --   --   HGB 15.8 16.7 17.0  HCT 47.7 49.0 50.0  MCV 75.1*  --   --   PLT 242  --   --    Basic Metabolic Panel: Recent Labs  Lab 01/08/23 1324 01/08/23 1845 01/08/23 2039  NA  132* 131* 132*  K 4.8 4.9 4.8  CL 100 96*  --   CO2  --  23  --   GLUCOSE 502* 503*  --   BUN 26* 21*  --   CREATININE 2.80* 2.99*  --   CALCIUM  --  8.9  --    GFR: CrCl cannot be calculated (Unknown ideal weight.). Liver Function Tests: No results for input(s): "AST", "ALT", "ALKPHOS", "BILITOT", "PROT", "ALBUMIN" in the last 168 hours. No results for input(s): "LIPASE", "AMYLASE" in the last 168 hours. No results for input(s): "AMMONIA" in the last 168 hours. Coagulation Profile: No results for input(s): "INR", "PROTIME" in the last 168 hours. Cardiac Enzymes: No results for input(s): "CKTOTAL", "CKMB", "CKMBINDEX", "TROPONINI", "TROPONINIHS" in the last 168 hours. BNP (last 3 results) No results for input(s): "BNP" in the last 8760 hours. HbA1C: No results for input(s): "HGBA1C" in the last 72 hours. CBG: Recent Labs  Lab 01/08/23 1245 01/08/23 1738 01/08/23 2134 01/08/23 2303  GLUCAP 466* 445* 445* 440*   Lipid Profile: No results for input(s): "CHOL", "HDL", "LDLCALC", "TRIG", "CHOLHDL", "LDLDIRECT" in the last 72 hours. Thyroid Function Tests: No results for input(s): "TSH", "T4TOTAL", "FREET4", "T3FREE", "THYROIDAB" in the last 72 hours. Anemia Panel: No results for input(s): "VITAMINB12", "FOLATE", "FERRITIN", "TIBC", "IRON", "RETICCTPCT" in the last 72 hours. Urine analysis:    Component Value Date/Time   COLORURINE YELLOW 01/08/2023 1420   APPEARANCEUR CLEAR  01/08/2023 1420   LABSPEC 1.016 01/08/2023 1420   LABSPEC 1.010 10/10/2011 1319   PHURINE 5.0 01/08/2023 1420   GLUCOSEU >=500 (A) 01/08/2023 1420   GLUCOSEU NEGATIVE 11/19/2017 1159   HGBUR NEGATIVE 01/08/2023 1420   BILIRUBINUR NEGATIVE 01/08/2023 1420   BILIRUBINUR Negative 10/10/2011 1319   KETONESUR 5 (A) 01/08/2023 1420   PROTEINUR 100 (A) 01/08/2023 1420   UROBILINOGEN 0.2 11/19/2017 1159   NITRITE NEGATIVE 01/08/2023 1420   LEUKOCYTESUR NEGATIVE 01/08/2023 1420   LEUKOCYTESUR Negative 10/10/2011 1319    Radiological Exams on Admission: I have personally reviewed images No results found.  EKG: My personal interpretation of EKG shows: ***    Assessment/Plan: Principal Problem:   Hyperosmolar hyperglycemic state (HHS) (HCC) Active Problems:   AKI (acute kidney injury) (HCC)   Non-insulin dependent type 2 diabetes mellitus (HCC)   CKD stage 3a, GFR 45-59 ml/min (HCC)   Essential hypertension   Microcytic anemia   Hyperlipidemia   Gout   Hypothyroidism   GAD (generalized anxiety disorder)    Assessment and Plan: Hyperglycemic crisis History of DM type II-not currently on insulin at home  Acute kidney injury CKD 3A  Microcytic anemia  Hyperlipidemia  Essential hypertension  Gout  Hypothyroidism -Pharmacy verified.  Patient not taking levothyroxine anymore.  Generalized Anxiety disorder  Insomnia      DVT prophylaxis:  Lovenox Code Status:  Full Code Diet:  Family Communication:  *** Family was present at bedside, at the time of interview.  Opportunity was given to ask question and all questions were answered satisfactorily.  Disposition Plan:  ***  Consults:  ***  Admission status:   Inpatient, Step Down Unit  Severity of Illness: The appropriate patient status for this patient is INPATIENT. Inpatient status is judged to be reasonable and necessary in order to provide the required intensity of service to ensure the patient's safety. The  patient's presenting symptoms, physical exam findings, and initial radiographic and laboratory data in the context of their chronic comorbidities is felt to place them at high risk  for further clinical deterioration. Furthermore, it is not anticipated that the patient will be medically stable for discharge from the hospital within 2 midnights of admission.   * I certify that at the point of admission it is my clinical judgment that the patient will require inpatient hospital care spanning beyond 2 midnights from the point of admission due to high intensity of service, high risk for further deterioration and high frequency of surveillance required.Marland Kitchen    Tereasa Coop, MD [facility]  How to contact the [facility] Attending or Consulting provider 7A - 7P or covering provider during after hours 7P -7A, for this patient.  Check the care team in [facility] and look for a) attending/consulting TRH provider listed and b) the [facility] team listed Log into [email or web address] and use [facility]'s universal password to access. If you do not have the password, please contact the hospital operator. Locate the [facility] provider you are looking for under [facility] and page to a number that you can be directly reached. If you still have difficulty reaching the provider, please page the [facility] (Director on Call) for the Hospitalists listed on amion for assistance.  01/08/2023, 11:10 PM

## 2023-01-08 NOTE — ED Notes (Signed)
Dr. Janalyn Shy stated she would admit to the progressive unit.

## 2023-01-08 NOTE — ED Triage Notes (Signed)
Pt presents to the ED with reports of feeling fatigued for the last several days. Pt wife took his blood sugar yesterday and it read HI. Today pt checked his blood sugar and it was still over 400.

## 2023-01-09 ENCOUNTER — Other Ambulatory Visit (HOSPITAL_COMMUNITY): Payer: Self-pay

## 2023-01-09 DIAGNOSIS — E1100 Type 2 diabetes mellitus with hyperosmolarity without nonketotic hyperglycemic-hyperosmolar coma (NKHHC): Secondary | ICD-10-CM | POA: Diagnosis not present

## 2023-01-09 LAB — CBG MONITORING, ED
Glucose-Capillary: 185 mg/dL — ABNORMAL HIGH (ref 70–99)
Glucose-Capillary: 192 mg/dL — ABNORMAL HIGH (ref 70–99)
Glucose-Capillary: 210 mg/dL — ABNORMAL HIGH (ref 70–99)
Glucose-Capillary: 237 mg/dL — ABNORMAL HIGH (ref 70–99)
Glucose-Capillary: 241 mg/dL — ABNORMAL HIGH (ref 70–99)
Glucose-Capillary: 247 mg/dL — ABNORMAL HIGH (ref 70–99)
Glucose-Capillary: 249 mg/dL — ABNORMAL HIGH (ref 70–99)
Glucose-Capillary: 253 mg/dL — ABNORMAL HIGH (ref 70–99)
Glucose-Capillary: 260 mg/dL — ABNORMAL HIGH (ref 70–99)
Glucose-Capillary: 296 mg/dL — ABNORMAL HIGH (ref 70–99)
Glucose-Capillary: 363 mg/dL — ABNORMAL HIGH (ref 70–99)
Glucose-Capillary: 407 mg/dL — ABNORMAL HIGH (ref 70–99)

## 2023-01-09 LAB — VITAMIN B12: Vitamin B-12: 289 pg/mL (ref 180–914)

## 2023-01-09 LAB — BASIC METABOLIC PANEL
Anion gap: 10 (ref 5–15)
Anion gap: 12 (ref 5–15)
Anion gap: 7 (ref 5–15)
Anion gap: 8 (ref 5–15)
Anion gap: 9 (ref 5–15)
Anion gap: 9 (ref 5–15)
BUN: 15 mg/dL (ref 6–20)
BUN: 15 mg/dL (ref 6–20)
BUN: 16 mg/dL (ref 6–20)
BUN: 16 mg/dL (ref 6–20)
BUN: 17 mg/dL (ref 6–20)
BUN: 22 mg/dL — ABNORMAL HIGH (ref 6–20)
CO2: 19 mmol/L — ABNORMAL LOW (ref 22–32)
CO2: 20 mmol/L — ABNORMAL LOW (ref 22–32)
CO2: 21 mmol/L — ABNORMAL LOW (ref 22–32)
CO2: 22 mmol/L (ref 22–32)
CO2: 22 mmol/L (ref 22–32)
CO2: 23 mmol/L (ref 22–32)
Calcium: 8.2 mg/dL — ABNORMAL LOW (ref 8.9–10.3)
Calcium: 8.4 mg/dL — ABNORMAL LOW (ref 8.9–10.3)
Calcium: 8.6 mg/dL — ABNORMAL LOW (ref 8.9–10.3)
Calcium: 8.6 mg/dL — ABNORMAL LOW (ref 8.9–10.3)
Calcium: 8.8 mg/dL — ABNORMAL LOW (ref 8.9–10.3)
Calcium: 8.9 mg/dL (ref 8.9–10.3)
Chloride: 100 mmol/L (ref 98–111)
Chloride: 103 mmol/L (ref 98–111)
Chloride: 104 mmol/L (ref 98–111)
Chloride: 104 mmol/L (ref 98–111)
Chloride: 105 mmol/L (ref 98–111)
Chloride: 107 mmol/L (ref 98–111)
Creatinine, Ser: 2.19 mg/dL — ABNORMAL HIGH (ref 0.61–1.24)
Creatinine, Ser: 2.31 mg/dL — ABNORMAL HIGH (ref 0.61–1.24)
Creatinine, Ser: 2.31 mg/dL — ABNORMAL HIGH (ref 0.61–1.24)
Creatinine, Ser: 2.38 mg/dL — ABNORMAL HIGH (ref 0.61–1.24)
Creatinine, Ser: 2.47 mg/dL — ABNORMAL HIGH (ref 0.61–1.24)
Creatinine, Ser: 2.73 mg/dL — ABNORMAL HIGH (ref 0.61–1.24)
GFR, Estimated: 27 mL/min — ABNORMAL LOW (ref >60)
GFR, Estimated: 30 mL/min — ABNORMAL LOW (ref >60)
GFR, Estimated: 32 mL/min — ABNORMAL LOW (ref >60)
GFR, Estimated: 33 mL/min — ABNORMAL LOW (ref >60)
GFR, Estimated: 33 mL/min — ABNORMAL LOW (ref >60)
GFR, Estimated: 35 mL/min — ABNORMAL LOW (ref >60)
Glucose, Bld: 208 mg/dL — ABNORMAL HIGH (ref 70–99)
Glucose, Bld: 257 mg/dL — ABNORMAL HIGH (ref 70–99)
Glucose, Bld: 272 mg/dL — ABNORMAL HIGH (ref 70–99)
Glucose, Bld: 274 mg/dL — ABNORMAL HIGH (ref 70–99)
Glucose, Bld: 287 mg/dL — ABNORMAL HIGH (ref 70–99)
Glucose, Bld: 425 mg/dL — ABNORMAL HIGH (ref 70–99)
Potassium: 3.5 mmol/L (ref 3.5–5.1)
Potassium: 3.7 mmol/L (ref 3.5–5.1)
Potassium: 3.8 mmol/L (ref 3.5–5.1)
Potassium: 3.8 mmol/L (ref 3.5–5.1)
Potassium: 4 mmol/L (ref 3.5–5.1)
Potassium: 4 mmol/L (ref 3.5–5.1)
Sodium: 132 mmol/L — ABNORMAL LOW (ref 135–145)
Sodium: 133 mmol/L — ABNORMAL LOW (ref 135–145)
Sodium: 134 mmol/L — ABNORMAL LOW (ref 135–145)
Sodium: 134 mmol/L — ABNORMAL LOW (ref 135–145)
Sodium: 136 mmol/L (ref 135–145)
Sodium: 136 mmol/L (ref 135–145)

## 2023-01-09 LAB — CBC
HCT: 42 % (ref 39.0–52.0)
Hemoglobin: 13.6 g/dL (ref 13.0–17.0)
MCH: 24.5 pg — ABNORMAL LOW (ref 26.0–34.0)
MCHC: 32.4 g/dL (ref 30.0–36.0)
MCV: 75.5 fL — ABNORMAL LOW (ref 80.0–100.0)
Platelets: 207 10*3/uL (ref 150–400)
RBC: 5.56 MIL/uL (ref 4.22–5.81)
RDW: 15.7 % — ABNORMAL HIGH (ref 11.5–15.5)
WBC: 10.2 10*3/uL (ref 4.0–10.5)
nRBC: 0 % (ref 0.0–0.2)

## 2023-01-09 LAB — RETICULOCYTES
Immature Retic Fract: 7.6 % (ref 2.3–15.9)
RBC.: 5.91 MIL/uL — ABNORMAL HIGH (ref 4.22–5.81)
Retic Count, Absolute: 75.1 10*3/uL (ref 19.0–186.0)
Retic Ct Pct: 1.3 % (ref 0.4–3.1)

## 2023-01-09 LAB — SODIUM, URINE, RANDOM: Sodium, Ur: 46 mmol/L

## 2023-01-09 LAB — FERRITIN: Ferritin: 360 ng/mL — ABNORMAL HIGH (ref 24–336)

## 2023-01-09 LAB — IRON AND TIBC
Iron: 49 ug/dL (ref 45–182)
Saturation Ratios: 16 % — ABNORMAL LOW (ref 17.9–39.5)
TIBC: 300 ug/dL (ref 250–450)
UIBC: 251 ug/dL

## 2023-01-09 LAB — HEMOGLOBIN A1C
Hgb A1c MFr Bld: 13.2 % — ABNORMAL HIGH (ref 4.8–5.6)
Mean Plasma Glucose: 332.14 mg/dL

## 2023-01-09 LAB — GLUCOSE, CAPILLARY
Glucose-Capillary: 242 mg/dL — ABNORMAL HIGH (ref 70–99)
Glucose-Capillary: 267 mg/dL — ABNORMAL HIGH (ref 70–99)

## 2023-01-09 LAB — HIV ANTIBODY (ROUTINE TESTING W REFLEX): HIV Screen 4th Generation wRfx: NONREACTIVE

## 2023-01-09 LAB — CREATININE, URINE, RANDOM: Creatinine, Urine: 144 mg/dL

## 2023-01-09 LAB — OSMOLALITY: Osmolality: 311 mosm/kg — ABNORMAL HIGH (ref 275–295)

## 2023-01-09 LAB — BETA-HYDROXYBUTYRIC ACID
Beta-Hydroxybutyric Acid: 0.16 mmol/L (ref 0.05–0.27)
Beta-Hydroxybutyric Acid: 0.58 mmol/L — ABNORMAL HIGH (ref 0.05–0.27)

## 2023-01-09 LAB — FOLATE: Folate: 7.3 ng/mL (ref >5.9)

## 2023-01-09 MED ORDER — INSULIN ASPART 100 UNIT/ML IJ SOLN
0.0000 [IU] | Freq: Every day | INTRAMUSCULAR | Status: DC
  Administered 2023-01-09: 3 [IU] via SUBCUTANEOUS

## 2023-01-09 MED ORDER — INSULIN ASPART 100 UNIT/ML IJ SOLN
0.0000 [IU] | Freq: Three times a day (TID) | INTRAMUSCULAR | Status: DC
  Administered 2023-01-10: 11 [IU] via SUBCUTANEOUS

## 2023-01-09 MED ORDER — INSULIN GLARGINE-YFGN 100 UNIT/ML ~~LOC~~ SOLN
15.0000 [IU] | Freq: Two times a day (BID) | SUBCUTANEOUS | Status: DC
  Administered 2023-01-09 – 2023-01-10 (×3): 15 [IU] via SUBCUTANEOUS
  Filled 2023-01-09 (×7): qty 0.15

## 2023-01-09 MED ORDER — INSULIN ASPART 100 UNIT/ML IJ SOLN
0.0000 [IU] | Freq: Three times a day (TID) | INTRAMUSCULAR | Status: DC
  Administered 2023-01-09 (×2): 5 [IU] via SUBCUTANEOUS

## 2023-01-09 NOTE — TOC Benefit Eligibility Note (Signed)
Patient Product/process development scientist completed.    The patient is insured through [facility]. Patient has [employer], may use a copay card, and/or apply for patient assistance if available.    Ran test claim for Lantus Pen and the current 30 day co-pay is $0.00.   This test claim was processed through [facility]- copay amounts may vary at other pharmacies due to pharmacy/plan contracts, or as the patient moves through the different stages of their insurance plan.     Roland Earl, CPHT Pharmacy Technician III Certified Patient Advocate [facility] Patient Advocate Team Direct Number: [phone]  Fax: [phone]

## 2023-01-09 NOTE — Assessment & Plan Note (Signed)
Noted. The patient has been continued on Wellbutrin XL 300 mg daily.

## 2023-01-09 NOTE — Progress Notes (Signed)
Progress Note   Patient: Parker West AVW:[id number] DOB: 1970/04/22 DOA: 01/08/2023     1 DOS: the patient was seen and examined on 01/09/2023   Brief hospital course: The patient is a 52 yr old man with a known medical history of DM II (poorly controlled), CKD 3A, HTN who presented to the [facility] ED on 01/08/2023 for evaluation of elevated blood glucose. He does not check his blood sugars at home, but in the past 2 days he had been feeling tired and fatigued. He has also been dizzy and lightheaded for the past week. He has had polyuria and polydipsia especially at night. The patient reports that his hbA1c when checked at the [place] just over a month ago was 6.5. However, when it was checked on admission it was 13.2.   At home the patient takes Ozempic at home for his diabetes. He has been prescribed Jardiance, but has not taken it. He admits to beginning to drink smoothies from the [employer] daily for the past month.   He has also admitted to burning with urination and dyspnea with exertion.  In the ED the patient was found to have a glucose of 500, an elevated creatinine of 2.8, and an elevated beta hydroxy butyrate of 1.24. Anion gap was 12. He was started on an insulin drip and admitted for hyperglycemic crisis.  This morning his glucoses remain elevated in the upper 200-300 range. He has been started on Lantus 15 units bid. The drip will be discontinued.  Diabetic counseling has been ordered and nursing has been asked to educate the patient on self administration of insulin.   Will continue to monitor the patient's insulin needs to prepare for discharge tomorrow.  Assessment and Plan: Hyperosmolar hyperglycemic state (HHS) (HCC) HbA1c is 13.2 on admission. Patient's glucoses have been brought down to the 300 range after being on the insulin drip overnight. He has been started on Lantus 15 units bid and nursing has been asked to educate the patient on insulin administration. Will monitor insulin  needs over the next 24 hours to determine his needs for discharge.  Non-insulin dependent type 2 diabetes mellitus (HCC) The patient states that his most recent HbA1c from the [place] was 6.5. This was just over a month ago. However, HbA1c performed in the ED was 13.2. The patient states that at home he takes ozempic. He had also been prescribed Jardiance, but that he had not taken it. He is currently receiving Lantus 15 untis bid with moderate dose SSI. He has had diabetic teaching.  AKI (acute kidney injury) (HCC) The patient's creatinine is 2.31 this morning down from 2.80 on 01/08/2023. It is not known what the patient's baseline creatinine is as we have no data going back further than 01/08/2023. Monitor. He will likely need a follow up with nephrology.  CKD stage 3a, GFR 45-59 ml/min (HCC) Baseline creatinine is unknown, but creatinine since presentation has trended downward from 2.99 to 2.31. Will continue to monitor. The patient likely needs to follow up with nephrology as outpatient.   Essential hypertension The patient has been continued on amlodipine 10 mg daily from home. He also has labetalol 10 mg IV q * hours prn hypertension. At home he also takes losartan 100 mg daily. This has been held for his AKI on CKD 3a. Monitor.  GAD (generalized anxiety disorder) Noted. The patient has been continued on Wellbutrin XL 300 mg daily.  Hyperlipidemia The patient has been continued on simvastatin 40 mg daily.  Hypothyroidism The patient is receiving levothyroxine 50 mcg 1 by mouth each morning before breakfast.  Microcytic anemia Noted. The patient is receiving ferrous sulfate 325 mg daily.         Subjective: The patient is resting uncomfortably on the gurney. He wants to know when he will get a bed.  Physical Exam: Vitals:   01/09/23 0600 01/09/23 0900 01/09/23 1000 01/09/23 1124  BP: 129/89 (!) 138/93 (!) 152/104 133/85  Pulse: 99 95 92 98  Resp: 16 13 16 14   Temp:    98 F  (36.7 C)  TempSrc:    Oral  SpO2: 100% 100% 100% 100%  Weight:       Exam:  Constitutional:  The patient is awake, alert, and oriented x 3. No acute distress. Eyes:  pupils and irises appear normal Normal lids and conjunctivae ENMT:  grossly normal hearing  Lips appear normal external ears, nose appear normal Oropharynx: mucosa, tongue,posterior pharynx appear normal Neck:  neck appears normal, no masses, normal ROM, supple no thyromegaly Respiratory:  No increased work of breathing. No wheezes, rales, or rhonchi No tactile fremitus Cardiovascular:  Regular rate and rhythm No murmurs, ectopy, or gallups. No lateral PMI. No thrills. Abdomen:  Abdomen is soft, non-tender, non-distended No hernias, masses, or organomegaly Normoactive bowel sounds.  Musculoskeletal:  No cyanosis, clubbing, or edema Skin:  No rashes, lesions, ulcers palpation of skin: no induration or nodules Neurologic:  CN 2-12 intact Sensation all 4 extremities intact Psychiatric:  Mental status Mood, affect appropriate Orientation to person, place, time  judgment and insight appear intact  Data Reviewed: CBC, CMP, HbA1c, Capillary Glucose   Family Communication: None available  Disposition: Status is: Inpatient Remains inpatient appropriate because: Need to control glucoses and determine appropriate doses for insulin at discharge.  Planned Discharge Destination: Home    Time spent: 38 minutes  Author: Milica Gully, DO 01/09/2023 12:53 PM  For on call review [email or web address].

## 2023-01-09 NOTE — ED Notes (Signed)
Checked patient cbg it was 192 notified RN

## 2023-01-09 NOTE — Assessment & Plan Note (Signed)
The patient states that his most recent HbA1c from the [place] was 6.5. This was just over a month ago. However, HbA1c performed in the ED was 13.2. The patient states that at home he takes ozempic. He had also been prescribed Jardiance, but that he had not taken it. He is currently receiving Lantus 15 untis bid with moderate dose SSI. He has had diabetic teaching.

## 2023-01-09 NOTE — Assessment & Plan Note (Signed)
Noted. The patient is receiving ferrous sulfate 325 mg daily.

## 2023-01-09 NOTE — Assessment & Plan Note (Signed)
The patient has been continued on simvastatin 40 mg daily.

## 2023-01-09 NOTE — Plan of Care (Signed)

## 2023-01-09 NOTE — Assessment & Plan Note (Signed)
HbA1c is 13.2 on admission. Patient's glucoses have been brought down to the 300 range after being on the insulin drip overnight. He has been started on Lantus 15 units bid and nursing has been asked to educate the patient on insulin administration. Will monitor insulin needs over the next 24 hours to determine his needs for discharge.

## 2023-01-09 NOTE — Assessment & Plan Note (Signed)
The patient has been continued on amlodipine 10 mg daily from home. He also has labetalol 10 mg IV q * hours prn hypertension. At home he also takes losartan 100 mg daily. This has been held for his AKI on CKD 3a. Monitor.

## 2023-01-09 NOTE — Assessment & Plan Note (Signed)
The patient's creatinine is 2.31 this morning down from 2.80 on 01/08/2023. It is not known what the patient's baseline creatinine is as we have no data going back further than 01/08/2023. Monitor. He will likely need a follow up with nephrology.

## 2023-01-09 NOTE — Assessment & Plan Note (Signed)
The patient is receiving levothyroxine 50 mcg 1 by mouth each morning before breakfast.

## 2023-01-09 NOTE — Inpatient Diabetes Management (Signed)
Inpatient Diabetes Program Recommendations  AACE/ADA: New Consensus Statement on Inpatient Glycemic Control (2015)  Target Ranges:  Prepandial:   less than 140 mg/dL      Peak postprandial:   less than 180 mg/dL (1-2 hours)      Critically ill patients:  140 - 180 mg/dL   Lab Results  Component Value Date   GLUCAP 210 (H) 01/09/2023   HGBA1C 13.2 (H) 01/09/2023     Discharge Recommendations: Long acting recommendations: Insulin Glargine (LANTUS) Solostar Pen dose based off of insulin needs day of discharge  Supply/Referral recommendations: Glucometer Test strips Lancet device Lancets Pen needles - standard   Use Adult Diabetes Insulin Treatment Post Discharge order set.

## 2023-01-09 NOTE — Assessment & Plan Note (Signed)
Baseline creatinine is unknown, but creatinine since presentation has trended downward from 2.99 to 2.31. Will continue to monitor. The patient likely needs to follow up with nephrology as outpatient.

## 2023-01-09 NOTE — ED Notes (Signed)
Checked patient cbg it was 185 notified RN

## 2023-01-09 NOTE — Inpatient Diabetes Management (Signed)
Inpatient Diabetes Program Recommendations  AACE/ADA: New Consensus Statement on Inpatient Glycemic Control (2015)  Target Ranges:  Prepandial:   less than 140 mg/dL      Peak postprandial:   less than 180 mg/dL (1-2 hours)      Critically ill patients:  140 - 180 mg/dL   Lab Results  Component Value Date   GLUCAP 185 (H) 01/09/2023   HGBA1C 13.2 (H) 01/09/2023    Review of Glycemic Control  Latest Reference Range & Units 01/09/23 05:08 01/09/23 06:03 01/09/23 07:21 01/09/23 08:12 01/09/23 09:36 01/09/23 10:40  Glucose-Capillary 70 - 99 mg/dL 409 (H) 811 (H) 914 (H) 253 (H) 192 (H) 185 (H)   Diabetes history: DM 2 Outpatient Diabetes medications: Ozempic 1 mg weekly (increased 1 month ago) Current orders for Inpatient glycemic control:  Semglee 15 units bid Novolog 0-15 units tid  A1c 13.2% this admission  Inpatient Diabetes Program Recommendations:    Spoke with patient about diabetes and home regimen for diabetes control. Patient reports that he is followed by the [facility] for Diabetes management. Pt reports having an increase in his Ozempic dose 1 month ago. Pt also reports changing his diet recently to eating only fruits and veggies during the week and then having protein on Saturdays. Pt also reports getting a tropical fruit smoothie with fruit, veggies and protein powder 1-2 times everyday. Discussed diet modifications again with pt. Discussed carbohydrates, carbohydrate goals per day and meal, along with portion sizes. Reviewed how to use insulin pen as it is the same as the Ozempic injection he is already use to administering himself  Discussed current A1c level and reviewed glucose and A1c goals. Discussed importance of checking CBGs and maintaining good CBG control to prevent long-term and short-term complications. Explained how hyperglycemia leads to damage within blood vessels which lead to the common complications seen with uncontrolled diabetes. Stressed to the patient the  importance of improving glycemic control to prevent further complications from uncontrolled diabetes.  Encouraged patient to check glucose at least 2 times per day and to keep a log book of glucose readings and insulin taken which will need to be taken to doctor appointments. Explained how the doctor can use the log book to continue to make insulin adjustments if needed.  Patient verbalized understanding of information discussed and he states that he has no further questions at this time related to diabetes.   Thanks,  Christena Deem RN, MSN, [facility] Inpatient Diabetes Coordinator Team Pager [phone] (8a-5p)

## 2023-01-09 NOTE — ED Notes (Signed)
ED TO INPATIENT HANDOFF REPORT  ED Nurse Name and Phone #: Kimbrely Buckel [phone]  S Name/Age/Gender Parker West 52 y.o. male Room/Bed: 002C/002C  Code Status   Code Status: Full Code  Home/SNF/Other Home Patient oriented to: self, place, time, and situation Is this baseline? Yes   Triage Complete: Triage complete  Chief Complaint Hyperglycemic crisis due to diabetes mellitus [facility]) [E11.65]  Triage Note Pt presents to the ED with reports of feeling fatigued for the last several days. Pt wife took his blood sugar yesterday and it read HI. Today pt checked his blood sugar and it was still over 400.   Allergies Allergies  Allergen Reactions   Lisinopril     Other Reaction(s): Cough    Level of Care/Admitting Diagnosis ED Disposition     ED Disposition  Admit   Condition  --   Comment  Hospital Area: [facility] [[id number]]  Level of Care: Progressive [102]  Admit to Progressive based on following criteria: Other see comments  Comments: Hyper glycemic crisis  May admit patient to [facility] or [facility] if equivalent level of care is available:: No  Covid Evaluation: Asymptomatic - no recent exposure (last 10 days) testing not required  Diagnosis: Hyperglycemic crisis due to diabetes mellitus [facility]) [[id number]]  Admitting Physician: Tereasa Coop [[id number]]  Attending Physician: Tereasa Coop [[id number]]  Certification:: I certify this patient will need inpatient services for at least 2 midnights          B Medical/Surgery History Past Medical History:  Diagnosis Date   Anginal pain (HCC)    Diabetes mellitus without complication (HCC)    Dyspnea 10/09/2011   At rest  Recent DVT    Gout    Heart murmur    "when I was small"   Hematochezia 10/09/2011   Low grade antedated DVT   Hyperlipidemia    Hypertension    Microcytic normochromic anemia 10/09/2011   retic 1.6% Hb 9.9 10/09/11   Obesity    Pneumonia 1988   "walking"   Staph infection 09/04/11    BLE/wife's report   Past Surgical History:  Procedure Laterality Date   EYE SURGERY     stye excision   REDUCTION MAMMAPLASTY  1990's     A IV Location/Drains/Wounds Patient Lines/Drains/Airways Status     Active Line/Drains/Airways     Name Placement date Placement time Site Days   Peripheral IV 01/08/23 20 G Anterior;Distal;Left;Upper Arm 01/08/23  1853  Arm  1   Peripheral IV 01/08/23 20 G Anterior;Right Hand 01/08/23  2358  Hand  1   Incision (Closed) 08/30/17 Axilla Right 08/30/17  0429  -- 1958            Intake/Output Last 24 hours  Intake/Output Summary (Last 24 hours) at 01/09/2023 1257 Last data filed at 01/09/2023 0347 Gross per 24 hour  Intake 4317.96 ml  Output --  Net 4317.96 ml    Labs/Imaging Results for orders placed or performed during the hospital encounter of 01/08/23 (from the past 48 hour(s))  CBG monitoring, ED     Status: Abnormal   Collection Time: 01/08/23 12:45 PM  Result Value Ref Range   Glucose-Capillary 466 (H) 70 - 99 mg/dL    Comment: Glucose reference range applies only to samples taken after fasting for at least 8 hours.  CBC     Status: Abnormal   Collection Time: 01/08/23  1:13 PM  Result Value Ref Range   WBC 11.8 (H) 4.0 -  10.5 K/uL    Comment: WHITE COUNT CONFIRMED ON SMEAR   RBC 6.35 (H) 4.22 - 5.81 MIL/uL   Hemoglobin 15.8 13.0 - 17.0 g/dL   HCT 16.1 09.6 - 04.5 %   MCV 75.1 (L) 80.0 - 100.0 fL   MCH 24.9 (L) 26.0 - 34.0 pg   MCHC 33.1 30.0 - 36.0 g/dL   RDW 40.9 (H) 81.1 - 91.4 %   Platelets 242 150 - 400 K/uL    Comment: REPEATED TO VERIFY PLATELET COUNT CONFIRMED BY SMEAR    nRBC 0.0 0.0 - 0.2 %    Comment: Performed at [facility], [address] [place], [place] [address]  I-stat chem 8, ed     Status: Abnormal   Collection Time: 01/08/23  1:24 PM  Result Value Ref Range   Sodium 132 (L) 135 - 145 mmol/L   Potassium 4.8 3.5 - 5.1 mmol/L   Chloride 100 98 - 111 mmol/L   BUN 26 (H) 6 - 20 mg/dL    Creatinine, Ser 6.21 (H) 0.61 - 1.24 mg/dL   Glucose, Bld 308 (HH) 70 - 99 mg/dL    Comment: Glucose reference range applies only to samples taken after fasting for at least 8 hours.   Calcium, Ion 1.12 (L) 1.15 - 1.40 mmol/L   TCO2 20 (L) 22 - 32 mmol/L   Hemoglobin 16.7 13.0 - 17.0 g/dL   HCT 65.7 84.6 - 96.2 %   Comment NOTIFIED PHYSICIAN   Urinalysis, Routine w reflex microscopic -Urine, Clean Catch     Status: Abnormal   Collection Time: 01/08/23  2:20 PM  Result Value Ref Range   Color, Urine YELLOW YELLOW   APPearance CLEAR CLEAR   Specific Gravity, Urine 1.016 1.005 - 1.030   pH 5.0 5.0 - 8.0   Glucose, UA >=500 (A) NEGATIVE mg/dL   Hgb urine dipstick NEGATIVE NEGATIVE   Bilirubin Urine NEGATIVE NEGATIVE   Ketones, ur 5 (A) NEGATIVE mg/dL   Protein, ur 952 (A) NEGATIVE mg/dL   Nitrite NEGATIVE NEGATIVE   Leukocytes,Ua NEGATIVE NEGATIVE   RBC / HPF 0-5 0 - 5 RBC/hpf   WBC, UA 0-5 0 - 5 WBC/hpf   Bacteria, UA RARE (A) NONE SEEN   Squamous Epithelial / HPF 0-5 0 - 5 /HPF   Mucus PRESENT     Comment: Performed at [facility], [address] [place], [place] [address]  CBG monitoring, ED     Status: Abnormal   Collection Time: 01/08/23  5:38 PM  Result Value Ref Range   Glucose-Capillary 445 (H) 70 - 99 mg/dL    Comment: Glucose reference range applies only to samples taken after fasting for at least 8 hours.  Basic metabolic panel     Status: Abnormal   Collection Time: 01/08/23  6:45 PM  Result Value Ref Range   Sodium 131 (L) 135 - 145 mmol/L   Potassium 4.9 3.5 - 5.1 mmol/L   Chloride 96 (L) 98 - 111 mmol/L   CO2 23 22 - 32 mmol/L   Glucose, Bld 503 (HH) 70 - 99 mg/dL    Comment: CRITICAL RESULT CALLED TO, READ BACK BY AND VERIFIED WITH H STEWART RN 01/08/2023 1949 BNUNNERY Glucose reference range applies only to samples taken after fasting for at least 8 hours.    BUN 21 (H) 6 - 20 mg/dL   Creatinine, Ser 4.40 (H) 0.61 - 1.24 mg/dL   Calcium 8.9 8.9 - 10.2  mg/dL  GFR, Estimated 24 (L) >60 mL/min    Comment: (NOTE) Calculated using the CKD-EPI Creatinine Equation (2021)    Anion gap 12 5 - 15    Comment: Performed at [facility], [address], [place], [place] [address]  Beta-hydroxybutyric acid     Status: Abnormal   Collection Time: 01/08/23  6:45 PM  Result Value Ref Range   Beta-Hydroxybutyric Acid 1.24 (H) 0.05 - 0.27 mmol/L    Comment: Performed at [facility], [address], [place], [place] [address]  I-Stat venous blood gas, [facility], [facility], [facility])     Status: Abnormal   Collection Time: 01/08/23  8:39 PM  Result Value Ref Range   pH, Ven 7.363 7.25 - 7.43   pCO2, Ven 42.1 (L) 44 - 60 mmHg   pO2, Ven 31 (LL) 32 - 45 mmHg   Bicarbonate 23.9 20.0 - 28.0 mmol/L   TCO2 25 22 - 32 mmol/L   O2 Saturation 56 %   Acid-base deficit 2.0 0.0 - 2.0 mmol/L   Sodium 132 (L) 135 - 145 mmol/L   Potassium 4.8 3.5 - 5.1 mmol/L   Calcium, Ion 1.13 (L) 1.15 - 1.40 mmol/L   HCT 50.0 39.0 - 52.0 %   Hemoglobin 17.0 13.0 - 17.0 g/dL   Sample type VENOUS    Comment NOTIFIED PHYSICIAN   Osmolality     Status: Abnormal   Collection Time: 01/08/23  9:02 PM  Result Value Ref Range   Osmolality 327 (HH) 275 - 295 mOsm/kg    Comment: REPEATED TO VERIFY CRITICAL RESULT CALLED TO, READ BACK BY AND VERIFIED WITH: STEWART,H RN 01/08/2023 AT 2143 SKEEN,P Performed at [facility], [address], [place], [place] [address]   CBG monitoring, ED     Status: Abnormal   Collection Time: 01/08/23  9:34 PM  Result Value Ref Range   Glucose-Capillary 445 (H) 70 - 99 mg/dL    Comment: Glucose reference range applies only to samples taken after fasting for at least 8 hours.  CBG monitoring, ED     Status: Abnormal   Collection Time: 01/08/23 11:03 PM  Result Value Ref Range   Glucose-Capillary 440 (H) 70 - 99 mg/dL    Comment: Glucose reference range applies only to samples taken after fasting for at least 8 hours.  CBG monitoring, ED      Status: Abnormal   Collection Time: 01/09/23 12:18 AM  Result Value Ref Range   Glucose-Capillary 407 (H) 70 - 99 mg/dL    Comment: Glucose reference range applies only to samples taken after fasting for at least 8 hours.  Hemoglobin A1c     Status: Abnormal   Collection Time: 01/09/23 12:20 AM  Result Value Ref Range   Hgb A1c MFr Bld 13.2 (H) 4.8 - 5.6 %    Comment: (NOTE) Pre diabetes:          5.7%-6.4%  Diabetes:              >6.4%  Glycemic control for   <7.0% adults with diabetes    Mean Plasma Glucose 332.14 mg/dL    Comment: Performed at [facility], [address] [place], [place] [address]  HIV Antibody (routine testing w rflx)     Status: None   Collection Time: 01/09/23 12:20 AM  Result Value Ref Range   HIV Screen 4th Generation wRfx Non Reactive Non Reactive    Comment: Performed at [facility]  Lab, [address] [place], [place] [address]  Vitamin B12     Status: None   Collection Time: 01/09/23 12:20 AM  Result Value Ref Range   Vitamin B-12 289 180 - 914 pg/mL    Comment: (NOTE) This assay is not validated for testing neonatal or myeloproliferative syndrome specimens for Vitamin B12 levels. Performed at [facility], [address], [place], [place] [address]   Folate     Status: None   Collection Time: 01/09/23 12:20 AM  Result Value Ref Range   Folate 7.3 >5.9 ng/mL    Comment: Performed at [facility], [address] [place], [place] [address]  Iron and TIBC     Status: Abnormal   Collection Time: 01/09/23 12:20 AM  Result Value Ref Range   Iron 49 45 - 182 ug/dL   TIBC 742 595 - 638 ug/dL   Saturation Ratios 16 (L) 17.9 - 39.5 %   UIBC 251 ug/dL    Comment: Performed at [facility], [address] [place], [place] [address]  Ferritin     Status: Abnormal   Collection Time: 01/09/23 12:20 AM  Result Value Ref Range   Ferritin 360 (H) 24 - 336 ng/mL    Comment: Performed at [facility], [address] [place], [place] [address]  Reticulocytes     Status: Abnormal   Collection Time: 01/09/23 12:20 AM  Result Value Ref Range   Retic Ct Pct 1.3 0.4 - 3.1 %   RBC. 5.91 (H) 4.22 - 5.81 MIL/uL   Retic Count, Absolute 75.1 19.0 - 186.0 K/uL   Immature Retic Fract 7.6 2.3 - 15.9 %    Comment: Performed at [facility], [address], [place], [place] [address]  Basic metabolic panel     Status: Abnormal   Collection Time: 01/09/23 12:20 AM  Result Value Ref Range   Sodium 132 (L) 135 - 145 mmol/L   Potassium 4.0 3.5 - 5.1 mmol/L   Chloride 100 98 - 111 mmol/L   CO2 20 (L) 22 - 32 mmol/L   Glucose, Bld 425 (H) 70 - 99 mg/dL    Comment: Glucose reference range applies only to samples taken after fasting for at least 8 hours.   BUN 22 (H) 6 - 20 mg/dL   Creatinine, Ser 6.06 (H) 0.61 - 1.24 mg/dL   Calcium 8.8 (L) 8.9 - 10.3 mg/dL   GFR, Estimated 27 (L) >60 mL/min    Comment: (NOTE) Calculated using the CKD-EPI Creatinine Equation (2021)    Anion gap 12 5 - 15    Comment: Performed at [facility], [address], [place], [place] [address]  Osmolality     Status: Abnormal   Collection Time: 01/09/23 12:20 AM  Result Value Ref Range   Osmolality 311 (H) 275 - 295 mOsm/kg    Comment: Performed at [facility], [address] [place], [place] [address]  CBG monitoring, ED     Status: Abnormal   Collection Time: 01/09/23  1:33 AM  Result Value Ref Range   Glucose-Capillary 363 (H) 70 - 99 mg/dL    Comment: Glucose reference range applies only to samples taken after fasting for at least 8 hours.  CBG monitoring, ED     Status: Abnormal   Collection Time: 01/09/23  2:28 AM  Result Value Ref Range   Glucose-Capillary 296 (H) 70 - 99 mg/dL  Comment: Glucose reference range applies only to samples taken after fasting for at least 8 hours.  Creatinine, urine, random     Status: None   Collection Time: 01/09/23  2:57 AM  Result Value Ref Range   Creatinine, Urine 144  mg/dL    Comment: Performed at [facility], [address] [place], [place] [address]  Sodium, urine, random     Status: None   Collection Time: 01/09/23  2:57 AM  Result Value Ref Range   Sodium, Ur 46 mmol/L    Comment: Performed at [facility], [address], [place], [place] [address]  CBG monitoring, ED     Status: Abnormal   Collection Time: 01/09/23  3:40 AM  Result Value Ref Range   Glucose-Capillary 249 (H) 70 - 99 mg/dL    Comment: Glucose reference range applies only to samples taken after fasting for at least 8 hours.  CBG monitoring, ED     Status: Abnormal   Collection Time: 01/09/23  4:58 AM  Result Value Ref Range   Glucose-Capillary 260 (H) 70 - 99 mg/dL    Comment: Glucose reference range applies only to samples taken after fasting for at least 8 hours.  CBC     Status: Abnormal   Collection Time: 01/09/23  5:00 AM  Result Value Ref Range   WBC 10.2 4.0 - 10.5 K/uL   RBC 5.56 4.22 - 5.81 MIL/uL   Hemoglobin 13.6 13.0 - 17.0 g/dL   HCT 09.8 11.9 - 14.7 %   MCV 75.5 (L) 80.0 - 100.0 fL   MCH 24.5 (L) 26.0 - 34.0 pg   MCHC 32.4 30.0 - 36.0 g/dL   RDW 82.9 (H) 56.2 - 13.0 %   Platelets 207 150 - 400 K/uL    Comment: REPEATED TO VERIFY   nRBC 0.0 0.0 - 0.2 %    Comment: Performed at [facility], [address], [place], [place] [address]  Beta-hydroxybutyric acid     Status: None   Collection Time: 01/09/23  5:00 AM  Result Value Ref Range   Beta-Hydroxybutyric Acid 0.16 0.05 - 0.27 mmol/L    Comment: Performed at [facility], [address] [place], [place] [address]  CBG monitoring, ED     Status: Abnormal   Collection Time: 01/09/23  5:08 AM  Result Value Ref Range   Glucose-Capillary 241 (H) 70 - 99 mg/dL    Comment: Glucose reference range applies only to samples taken after fasting for at least 8 hours.  CBG monitoring, ED     Status: Abnormal   Collection Time: 01/09/23  6:03 AM  Result Value Ref Range   Glucose-Capillary  237 (H) 70 - 99 mg/dL    Comment: Glucose reference range applies only to samples taken after fasting for at least 8 hours.  Basic metabolic panel     Status: Abnormal   Collection Time: 01/09/23  6:50 AM  Result Value Ref Range   Sodium 133 (L) 135 - 145 mmol/L   Potassium 3.7 3.5 - 5.1 mmol/L   Chloride 104 98 - 111 mmol/L   CO2 19 (L) 22 - 32 mmol/L   Glucose, Bld 287 (H) 70 - 99 mg/dL    Comment: Glucose reference range applies only to samples taken after fasting for at least 8 hours.   BUN 17 6 - 20 mg/dL   Creatinine, Ser 9.52 (H) 0.61 - 1.24 mg/dL   Calcium 8.9 8.9 -  10.3 mg/dL   GFR, Estimated 33 (L) >60 mL/min    Comment: (NOTE) Calculated using the CKD-EPI Creatinine Equation (2021)    Anion gap 10 5 - 15    Comment: Performed at [facility], [address] [place], [place] [address]  CBG monitoring, ED     Status: Abnormal   Collection Time: 01/09/23  7:21 AM  Result Value Ref Range   Glucose-Capillary 247 (H) 70 - 99 mg/dL    Comment: Glucose reference range applies only to samples taken after fasting for at least 8 hours.  CBG monitoring, ED     Status: Abnormal   Collection Time: 01/09/23  8:12 AM  Result Value Ref Range   Glucose-Capillary 253 (H) 70 - 99 mg/dL    Comment: Glucose reference range applies only to samples taken after fasting for at least 8 hours.  CBG monitoring, ED     Status: Abnormal   Collection Time: 01/09/23  9:36 AM  Result Value Ref Range   Glucose-Capillary 192 (H) 70 - 99 mg/dL    Comment: Glucose reference range applies only to samples taken after fasting for at least 8 hours.  CBG monitoring, ED     Status: Abnormal   Collection Time: 01/09/23 10:40 AM  Result Value Ref Range   Glucose-Capillary 185 (H) 70 - 99 mg/dL    Comment: Glucose reference range applies only to samples taken after fasting for at least 8 hours.  CBG monitoring, ED     Status: Abnormal   Collection Time: 01/09/23 12:36 PM  Result Value Ref Range    Glucose-Capillary 210 (H) 70 - 99 mg/dL    Comment: Glucose reference range applies only to samples taken after fasting for at least 8 hours.   No results found.  Pending Labs [employer] (From admission, onward)     Start     Ordered   01/09/23 0709  Basic metabolic panel  (Hyperglycemic Hyperosmolar State (HHS))  STAT Now then every 4 hours ,   R (with STAT occurrences)      01/09/23 0533   01/09/23 0500  Beta-hydroxybutyric acid  2 times daily,   R (with TIMED occurrences)      01/09/23 0259   01/08/23 2154  Occult blood card to lab, stool  Once,   R        01/08/23 2154            Vitals/Pain Today's Vitals   01/09/23 0900 01/09/23 1000 01/09/23 1124 01/09/23 1200  BP: (!) 138/93 (!) 152/104 133/85 (!) 140/102  Pulse: 95 92 98 (!) 101  Resp: 13 16 14 19   Temp:   98 F (36.7 C)   TempSrc:   Oral   SpO2: 100% 100% 100% 100%  Weight:      PainSc:        Isolation Precautions No active isolations  Medications Medications  aspirin chewable tablet 81 mg (81 mg Oral Given 01/09/23 1036)  amLODipine (NORVASC) tablet 10 mg (10 mg Oral Given 01/09/23 1036)  simvastatin (ZOCOR) tablet 40 mg (40 mg Oral Given 01/09/23 1036)  buPROPion (WELLBUTRIN XL) 24 hr tablet 300 mg (300 mg Oral Given 01/09/23 0607)  traZODone (DESYREL) tablet 350 mg (has no administration in time range)  sodium chloride flush (NS) 0.9 % injection 3 mL (3 mLs Intravenous Given 01/09/23 1036)  sodium chloride flush (NS) 0.9 % injection 3 mL (has no administration in time range)  0.9 %  sodium chloride infusion (  has no administration in time range)  acetaminophen (TYLENOL) tablet 650 mg (has no administration in time range)    Or  acetaminophen (TYLENOL) suppository 650 mg (has no administration in time range)  ondansetron (ZOFRAN) tablet 4 mg (has no administration in time range)    Or  ondansetron (ZOFRAN) injection 4 mg (has no administration in time range)  senna-docusate (Senokot-S) tablet 1  tablet (has no administration in time range)  labetalol (NORMODYNE) injection 10 mg (has no administration in time range)  heparin injection 5,000 Units (5,000 Units Subcutaneous Given 01/09/23 0607)  ferrous sulfate tablet 325 mg (325 mg Oral Given 01/09/23 1257)  insulin regular, human (MYXREDLIN) 100 units/ 100 mL infusion (0 Units/hr Intravenous Stopped 01/09/23 1043)  lactated ringers infusion (0 mLs Intravenous Stopped 01/09/23 1049)  dextrose 5 % in lactated ringers infusion (0 mLs Intravenous Stopped 01/09/23 1049)  dextrose 50 % solution 0-50 mL (has no administration in time range)  insulin glargine-yfgn (SEMGLEE) injection 15 Units (15 Units Subcutaneous Given 01/09/23 1036)  insulin aspart (novoLOG) injection 0-15 Units (5 Units Subcutaneous Given 01/09/23 1257)  sodium chloride 0.9 % bolus 1,000 mL (0 mLs Intravenous Stopped 01/08/23 2203)  insulin aspart (novoLOG) injection 8 Units (8 Units Subcutaneous Given 01/08/23 2031)  lactated ringers bolus 2,894 mL (0 mLs Intravenous Stopped 01/09/23 0347)  potassium chloride 10 mEq in 100 mL IVPB (0 mEq Intravenous Stopped 01/09/23 0238)    Mobility walks     Focused Assessments GI/ endocrine   R Recommendations: See Admitting Provider Note  Report given to:   Additional Notes:

## 2023-01-09 NOTE — TOC Initial Note (Signed)
Transition of Care [facility] - Initial/Assessment Note    Patient Details  Name: Parker West MRN: [id number] Date of Birth: 1970/05/12  Transition of Care [facility]) CM/SW Contact:    Lawerance Sabal, RN Phone Number: 01/09/2023, 3:16 PM  Clinical Narrative:                  Sherron Monday w patient over the phone.  Discussed DME needs CBG machine. Discussed [employer] (as DME providers do not supply them for TOC to order) is the most cost effective brand to use.  He will get one at [facility].  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  He gets meds filled at the [place] but if needed meds can be sent to [facility] pharmacy or [facility] on [address]  I advised him to call the [place] and notify them of his hospitalization.  Expected Discharge Plan: Home/Self Care Barriers to Discharge: Continued Medical Work up   Patient Goals and CMS Choice Patient states their goals for this hospitalization and ongoing recovery are:: to return home          Expected Discharge Plan and Services   Discharge Planning Services: CM Consult   Living arrangements for the past 2 months: Single Family Home                                      Prior Living Arrangements/Services Living arrangements for the past 2 months: Single Family Home Lives with:: Spouse                   Activities of Daily Living   ADL Screening (condition at time of admission) Independently performs ADLs?: Yes (appropriate for developmental age) Is the patient deaf or have difficulty hearing?: No Does the patient have difficulty seeing, even when wearing glasses/contacts?: No Does the patient have difficulty concentrating, remembering, or making decisions?: No  Permission Sought/Granted                  Emotional Assessment              Admission diagnosis:  AKI (acute kidney injury) (HCC) [N17.9] Type 2 diabetes mellitus with hyperglycemia, unspecified whether long term insulin use (HCC)  [E11.65] Hyperglycemic crisis due to diabetes mellitus (HCC) [E11.65] Patient Active Problem List   Diagnosis Date Noted   Hypothyroidism 01/08/2023   GAD (generalized anxiety disorder) 01/08/2023   Uncontrolled type 2 diabetes mellitus with hyperglycemia, without long-term current use of insulin (HCC) 03/10/2018   Abscess of axilla, right 08/30/2017   CKD stage 3a, GFR 45-59 ml/min (HCC) 08/30/2017   Hyperosmolar hyperglycemic state (HHS) (HCC) 10/30/2014   Hyperglycemia 10/30/2014   AKI (acute kidney injury) (HCC) 10/30/2014   Leukocytosis 10/30/2014   Hyperlipidemia 10/30/2014   Non-insulin dependent type 2 diabetes mellitus (HCC)    Morbid obesity (HCC)    Hematochezia 10/09/2011   Microcytic anemia 10/09/2011   Dyspnea 10/09/2011   Edema 09/11/2011   DVT (deep venous thrombosis) (HCC) 09/06/2011   Cellulitis of multiple sites of lower extremity 09/05/2011   HTN (hypertension) 09/05/2011   Dermatosis 09/05/2011   Essential hypertension 02/17/2009   ERECTILE DYSFUNCTION, 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 - [address] AT [address] [place] [address] Phone: [phone] Fax: [phone]     Social  Determinants of Health (SDOH) Social History: SDOH Screenings   Food Insecurity: No Food Insecurity (01/08/2023)  Housing: Low Risk  (01/08/2023)  Transportation Needs: No Transportation Needs (01/08/2023)  Utilities: Not At Risk (01/08/2023)  Tobacco Use: Low Risk  (01/08/2023)   SDOH Interventions:     Readmission Risk Interventions     No data to display

## 2023-01-10 ENCOUNTER — Other Ambulatory Visit (HOSPITAL_COMMUNITY): Payer: Self-pay

## 2023-01-10 DIAGNOSIS — F411 Generalized anxiety disorder: Secondary | ICD-10-CM | POA: Diagnosis not present

## 2023-01-10 DIAGNOSIS — E039 Hypothyroidism, unspecified: Secondary | ICD-10-CM

## 2023-01-10 DIAGNOSIS — I10 Essential (primary) hypertension: Secondary | ICD-10-CM | POA: Diagnosis not present

## 2023-01-10 DIAGNOSIS — E1100 Type 2 diabetes mellitus with hyperosmolarity without nonketotic hyperglycemic-hyperosmolar coma (NKHHC): Secondary | ICD-10-CM | POA: Diagnosis not present

## 2023-01-10 LAB — BASIC METABOLIC PANEL
Anion gap: 8 (ref 5–15)
Anion gap: 5 (ref 5–15)
Anion gap: 12 (ref 5–15)
BUN: 16 mg/dL (ref 6–20)
BUN: 13 mg/dL (ref 6–20)
BUN: 15 mg/dL (ref 6–20)
CO2: 23 mmol/L (ref 22–32)
CO2: 24 mmol/L (ref 22–32)
CO2: 20 mmol/L — ABNORMAL LOW (ref 22–32)
Calcium: 8.7 mg/dL — ABNORMAL LOW (ref 8.9–10.3)
Calcium: 8.5 mg/dL — ABNORMAL LOW (ref 8.9–10.3)
Calcium: 9 mg/dL (ref 8.9–10.3)
Chloride: 106 mmol/L (ref 98–111)
Chloride: 106 mmol/L (ref 98–111)
Chloride: 104 mmol/L (ref 98–111)
Creatinine, Ser: 2.25 mg/dL — ABNORMAL HIGH (ref 0.61–1.24)
Creatinine, Ser: 2.16 mg/dL — ABNORMAL HIGH (ref 0.61–1.24)
Creatinine, Ser: 2.11 mg/dL — ABNORMAL HIGH (ref 0.61–1.24)
GFR, Estimated: 34 mL/min — ABNORMAL LOW (ref >60)
GFR, Estimated: 36 mL/min — ABNORMAL LOW (ref >60)
GFR, Estimated: 37 mL/min — ABNORMAL LOW (ref >60)
Glucose, Bld: 254 mg/dL — ABNORMAL HIGH (ref 70–99)
Glucose, Bld: 276 mg/dL — ABNORMAL HIGH (ref 70–99)
Glucose, Bld: 287 mg/dL — ABNORMAL HIGH (ref 70–99)
Potassium: 3.8 mmol/L (ref 3.5–5.1)
Potassium: 3.8 mmol/L (ref 3.5–5.1)
Potassium: 4.2 mmol/L (ref 3.5–5.1)
Sodium: 137 mmol/L (ref 135–145)
Sodium: 135 mmol/L (ref 135–145)
Sodium: 136 mmol/L (ref 135–145)

## 2023-01-10 LAB — GLUCOSE, CAPILLARY: Glucose-Capillary: 276 mg/dL — ABNORMAL HIGH (ref 70–99)

## 2023-01-10 MED ORDER — INSULIN GLARGINE-YFGN 100 UNIT/ML ~~LOC~~ SOLN
15.0000 [IU] | Freq: Once | SUBCUTANEOUS | Status: DC
  Filled 2023-01-10: qty 0.15

## 2023-01-10 MED ORDER — LANCETS 33G MISC
1.0000 | Freq: Three times a day (TID) | 0 refills | Status: AC
  Filled 2023-01-10: qty 100, 30d supply, fill #0

## 2023-01-10 MED ORDER — BLOOD GLUCOSE TEST VI STRP
1.0000 | ORAL_STRIP | Freq: Three times a day (TID) | 0 refills | Status: AC
  Filled 2023-01-10: qty 100, 30d supply, fill #0

## 2023-01-10 MED ORDER — INSULIN PEN NEEDLE 32G X 4 MM MISC
1.0000 | 0 refills | Status: AC | PRN
Start: 2023-01-10 — End: ?
  Filled 2023-01-10: qty 200, 30d supply, fill #0

## 2023-01-10 MED ORDER — BLOOD GLUCOSE MONITOR SYSTEM W/DEVICE KIT
1.0000 | PACK | Freq: Three times a day (TID) | 0 refills | Status: AC
  Filled 2023-01-10: qty 1, 30d supply, fill #0

## 2023-01-10 MED ORDER — VITAMIN B-12 1000 MCG PO TABS
1000.0000 ug | ORAL_TABLET | Freq: Every day | ORAL | 1 refills | Status: AC
  Filled 2023-01-10: qty 30, 30d supply, fill #0

## 2023-01-10 MED ORDER — LANCET DEVICE MISC
1.0000 | Freq: Three times a day (TID) | 0 refills | Status: AC
  Filled 2023-01-10: qty 1, 30d supply, fill #0

## 2023-01-10 MED ORDER — INSULIN GLARGINE 100 UNIT/ML SOLOSTAR PEN: 34.0000 [IU] | PEN_INJECTOR | Freq: Every day | SUBCUTANEOUS | 0 refills | Status: DC

## 2023-01-10 MED ORDER — INSULIN GLARGINE 100 UNIT/ML SOLOSTAR PEN
34.0000 [IU] | PEN_INJECTOR | Freq: Every day | SUBCUTANEOUS | 0 refills | Status: AC
  Filled 2023-01-10: qty 9, 26d supply, fill #0

## 2023-01-10 NOTE — TOC Benefit Eligibility Note (Signed)
Patient Product/process development scientist completed.    The patient is insured through [facility]. Patient has [employer], may use a copay card, and/or apply for patient assistance if available.    Ran test claim for Dexcom G7 Sensor and Requires Prior Authorization  Ran test claim for [employer] 3 Sensor and Requires Prior Authorization  This test claim was processed through [employer]- copay amounts may vary at other pharmacies due to [employer], or as the patient moves through the different stages of their insurance plan.     Roland Earl, CPHT Pharmacy Technician III Certified Patient Advocate [facility] Patient Advocate Team Direct Number: [phone]  Fax: [phone]

## 2023-01-10 NOTE — Inpatient Diabetes Management (Signed)
Inpatient Diabetes Program Recommendations  AACE/ADA: New Consensus Statement on Inpatient Glycemic Control (2015)  Target Ranges:  Prepandial:   less than 140 mg/dL      Peak postprandial:   less than 180 mg/dL (1-2 hours)      Critically ill patients:  140 - 180 mg/dL   Lab Results  Component Value Date   GLUCAP 276 (H) 01/10/2023   HGBA1C 13.2 (H) 01/09/2023    Went to see pt to see if he had any follow up questions on Diabetes education or insulin delivery. Pre Authorization on the [employer] CGM was unable to be performed through pharmacy. [facility] and steps to be taken for CGM Prior Auth. No other questions or concerns at this time.  Thanks,  Christena Deem RN, MSN, BC-ADM Inpatient Diabetes Coordinator Team Pager [phone] (8a-5p)

## 2023-01-10 NOTE — Plan of Care (Signed)

## 2023-01-10 NOTE — Progress Notes (Signed)
Discharge instructions given. Patient verbalized understanding and all questions were answered.  ?

## 2023-01-10 NOTE — Discharge Summary (Signed)
PATIENT DETAILS Name: MERTON OSLER Age: 52 y.o. Sex: male Date of Birth: [birth year] MRN: [id number]. Admitting Physician: Tereasa Coop, MD EXB:[id number], Lenn Sink  Admit Date: 01/08/2023 Discharge date: 01/10/2023  Recommendations for Outpatient Follow-up:  Follow up with PCP in 1-2 weeks Please obtain CMP/CBC in one week Please optimize glycemic regimen.  Admitted From:  Home  Disposition: Home   Discharge Condition: good  CODE STATUS:   Code Status: Full Code   Diet recommendation:  Diet Order             Diet - low sodium heart healthy           Diet Carb Modified           Diet Carb Modified Fluid consistency: Thin; Room service appropriate? Yes  Diet effective now                    Brief Summary: 52 year old with poorly controlled DM-2-noncompliant with medications-presented with polyuria/polydipsia/generalized weakness-he was found to have hyperglycemic nonketotic hyperglycemic state-started on IV insulin and admitted to the hospitalist service.  Brief Hospital Course: Uncontrolled DM-2 with hyperosmolar-hyperglycemic state Managed with IV insulin/IV fluid-subsequently transitioned to SQ insulin A1c unfortunately at 13.2 CBGs relatively stable-around 200-300 range on 15 units twice daily of Semglee during this hospitalization, will consolidate dosing to 34 units daily He will fortunately has been noncompliant with Jardiance-given uncontrolled hyperglycemia-elevated A1c-hold Jardiance until seen by outpatient physician.  Hold Ozempic until seen by his outpatient primary care practitioner as well. Extensive diabetic education-insulin administration education done by diabetic coordinator-patient aware of symptoms of hypoglycemia and needed interventions.  He will be discharged home today-and follow-up with his primary care practitioner for further optimization.  AKI on CKD stage IIIa AKI hemodynamically mediated-slowly improving-almost back to  baseline Repeat labs  in 1 week at PCPs office.  HTN BP controlled-continue amlodipine Continue to hold losartan for now-repeat chemistry panel at PCPs office before resuming to ensure renal function is stable.  HLD Statin  GAD Stable Wellbutrin  Hypothyroidism Synthroid  Vitamin B12 deficiency Starting oral vitamin B12 on discharge-repeat vitamin B12 levels in 6 weeks-if still on the lower side-may need parenteral supplementation.  Obesity: Estimated body mass index is 43.8 kg/m as calculated from the following:   Height as of 04/01/18: 6' (1.829 m).   Weight as of this encounter: 146.5 kg.    Discharge Diagnoses:  Principal Problem:   Hyperosmolar hyperglycemic state (HHS) (HCC) Active Problems:   AKI (acute kidney injury) (HCC)   Non-insulin dependent type 2 diabetes mellitus (HCC)   CKD stage 3a, GFR 45-59 ml/min (HCC)   Essential hypertension   Microcytic anemia   Hyperlipidemia   Hypothyroidism   GAD (generalized anxiety disorder)   Discharge Instructions:  Activity:  As tolerated  Discharge Instructions     Call MD for:  extreme fatigue   Complete by: As directed    Call MD for:  persistant dizziness or light-headedness   Complete by: As directed    Diet - low sodium heart healthy   Complete by: As directed    Diet Carb Modified   Complete by: As directed    Discharge instructions   Complete by: As directed    Follow with Primary MD  Clinic, [facility] in 1-2 weeks  Check your blood sugars multiple times a day-keep a record of your readings and take it to your next appointment with your primary care practitioner.  Please get a complete blood count  and chemistry panel checked by your Primary MD at your next visit, and again as instructed by your Primary MD.  Get Medicines reviewed and adjusted: Please take all your medications with you for your next visit with your Primary MD  Laboratory/radiological data: Please request your Primary MD to  go over all hospital tests and procedure/radiological results at the follow up, please ask your Primary MD to get all Hospital records sent to his/her office.  In some cases, they will be blood work, cultures and biopsy results pending at the time of your discharge. Please request that your primary care M.D. follows up on these results.  Also Note the following: If you experience worsening of your admission symptoms, develop shortness of breath, life threatening emergency, suicidal or homicidal thoughts you must seek medical attention immediately by calling 911 or calling your MD immediately  if symptoms less severe.  You must read complete instructions/literature along with all the possible adverse reactions/side effects for all the Medicines you take and that have been prescribed to you. Take any new Medicines after you have completely understood and accpet all the possible adverse reactions/side effects.   Do not drive when taking Pain medications or sleeping medications (Benzodaizepines)  Do not take more than prescribed Pain, Sleep and Anxiety Medications. It is not advisable to combine anxiety,sleep and pain medications without talking with your primary care practitioner  Special Instructions: If you have smoked or chewed Tobacco  in the last 2 yrs please stop smoking, stop any regular Alcohol  and or any Recreational drug use.  Wear Seat belts while driving.  Please note: You were cared for by a hospitalist during your hospital stay. Once you are discharged, your primary care physician will handle any further medical issues. Please note that NO REFILLS for any discharge medications will be authorized once you are discharged, as it is imperative that you return to your primary care physician (or establish a relationship with a primary care physician if you do not have one) for your post hospital discharge needs so that they can reassess your need for medications and monitor your lab values.    Increase activity slowly   Complete by: As directed       Allergies as of 01/10/2023       Reactions   Lisinopril    Other Reaction(s): Cough        Medication List     STOP taking these medications    empagliflozin 25 MG Tabs tablet Commonly known as: JARDIANCE   losartan 100 MG tablet Commonly known as: COZAAR   Semaglutide (1 MG/DOSE) 4 MG/3ML Sopn       TAKE these medications    amLODipine 10 MG tablet Commonly known as: NORVASC Take 10 mg by mouth daily.   aspirin 81 MG chewable tablet Chew 81 mg by mouth daily.   buPROPion 300 MG 24 hr tablet Commonly known as: WELLBUTRIN XL Take 300 mg by mouth in the morning.   Cholecalciferol 50 MCG (2000 UT) Tabs Take 1 tablet by mouth daily.   clindamycin 1 % lotion Commonly known as: CLEOCIN T Apply 1 Application topically See admin instructions. Apply a moderate amount to affected area every day   cyanocobalamin 1000 MCG tablet Commonly known as: VITAMIN B12 Take 1 tablet (1,000 mcg total) by mouth daily.   glucose blood test strip Use OneTouch verio test strips to check blood sugar twice daily. What changed: Another medication with the same name was added. Make sure  you understand how and when to take each.   BLOOD GLUCOSE TEST STRIPS Strp 1 each by In Vitro route in the morning, at noon, and at bedtime. May substitute to any manufacturer covered by patient's insurance. What changed: You were already taking a medication with the same name, and this prescription was added. Make sure you understand how and when to take each.   insulin glargine 100 UNIT/ML Solostar Pen Commonly known as: LANTUS Inject 34 Units into the skin daily.   Insulin Pen Needle 31G X 5 MM Misc Use twice a day on insulin pen What changed: Another medication with the same name was added. Make sure you understand how and when to take each.   Insulin Pen Needle 32G X 4 MM Misc 1 each by Does not apply route as needed. What  changed: You were already taking a medication with the same name, and this prescription was added. Make sure you understand how and when to take each.   insulin starter kit- syringes Misc 1 kit by Other route once.   Lancet Device Misc 1 each by Does not apply route in the morning, at noon, and at bedtime. May substitute to any manufacturer covered by patient's insurance.   Lancets Misc. Misc 1 each by Does not apply route in the morning, at noon, and at bedtime. May substitute to any manufacturer covered by patient's insurance.   onetouch ultrasoft lancets Use as instructed to check blood sugar 3 times a day   OneTouch Verio Flex System w/Device Kit Use as instructed to check blood sugar twice daily. What changed: Another medication with the same name was added. Make sure you understand how and when to take each.   Blood Glucose Monitoring Suppl Devi 1 each by Does not apply route in the morning, at noon, and at bedtime. May substitute to any manufacturer covered by patient's insurance. What changed: You were already taking a medication with the same name, and this prescription was added. Make sure you understand how and when to take each.   simvastatin 40 MG tablet Commonly known as: ZOCOR Take 40 mg by mouth daily.   traZODone 100 MG tablet Commonly known as: DESYREL Take 350 mg by mouth at bedtime as needed for sleep.   triamcinolone ointment 0.1 % Commonly known as: KENALOG Apply 1 Application topically See admin instructions. Apply a small amount to the affected areas twice a week when needed   valACYclovir 1000 MG tablet Commonly known as: VALTREX Take 1,000 mg by mouth daily as needed.               Durable Medical Equipment  (From admission, onward)           Start     Ordered   01/09/23 1253  DME Glucometer  Once        01/09/23 1252            Follow-up Information     [facility] Schedule an appointment as soon as possible for a  visit in 1 week(s).   Contact information: [address] [facility] [place] [address] [phone]                Allergies  Allergen Reactions   Lisinopril     Other Reaction(s): Cough     Other Procedures/Studies: No results found.   TODAY-DAY OF DISCHARGE:  Subjective:   Quantavious Schillo today has no headache,no chest abdominal pain,no new weakness tingling or numbness, feels much better  wants to go home today.   Objective:   Blood pressure (!) 151/100, pulse 100, temperature 98 F (36.7 C), temperature source Oral, resp. rate 16, weight (!) 146.5 kg, SpO2 95%. No intake or output data in the 24 hours ending 01/10/23 1026 Filed Weights   01/08/23 2300 01/10/23 0500  Weight: (!) 144.7 kg (!) 146.5 kg    Exam: Awake Alert, Oriented *3, No new F.N deficits, Normal affect Welcome.AT,PERRAL Supple Neck,No JVD, No cervical lymphadenopathy appriciated.  Symmetrical Chest wall movement, Good air movement bilaterally, CTAB RRR,No Gallops,Rubs or new Murmurs, No Parasternal Heave +ve B.Sounds, Abd Soft, Non tender, No organomegaly appriciated, No rebound -guarding or rigidity. No Cyanosis, Clubbing or edema, No new Rash or bruise   PERTINENT RADIOLOGIC STUDIES: No results found.   PERTINENT LAB RESULTS: CBC: Recent Labs    01/08/23 1313 01/08/23 1324 01/08/23 2039 01/09/23 0500  WBC 11.8*  --   --  10.2  HGB 15.8   < > 17.0 13.6  HCT 47.7   < > 50.0 42.0  PLT 242  --   --  207   < > = values in this interval not displayed.   CMET CMP     Component Value Date/Time   NA 135 01/10/2023 0654   K 3.8 01/10/2023 0654   CL 106 01/10/2023 0654   CO2 24 01/10/2023 0654   GLUCOSE 276 (H) 01/10/2023 0654   BUN 13 01/10/2023 0654   CREATININE 2.16 (H) 01/10/2023 0654   CALCIUM 8.5 (L) 01/10/2023 0654   PROT 7.3 01/04/2019 1138   ALBUMIN 3.4 (L) 01/04/2019 1138   AST 10 01/04/2019 1138   ALT 10 01/04/2019 1138   ALKPHOS 72 01/04/2019 1138    BILITOT 0.3 01/04/2019 1138   GFR 53.41 (L) 06/15/2019 1204   GFRNONAA 36 (L) 01/10/2023 0654    GFR CrCl cannot be calculated (Unknown ideal weight.). No results for input(s): "LIPASE", "AMYLASE" in the last 72 hours. No results for input(s): "CKTOTAL", "CKMB", "CKMBINDEX", "TROPONINI" in the last 72 hours. Invalid input(s): "POCBNP" No results for input(s): "DDIMER" in the last 72 hours. Recent Labs    01/09/23 0020  HGBA1C 13.2*   No results for input(s): "CHOL", "HDL", "LDLCALC", "TRIG", "CHOLHDL", "LDLDIRECT" in the last 72 hours. No results for input(s): "TSH", "T4TOTAL", "T3FREE", "THYROIDAB" in the last 72 hours.  Invalid input(s): "FREET3" Recent Labs    01/09/23 0020  VITAMINB12 289  FOLATE 7.3  FERRITIN 360*  TIBC 300  IRON 49  RETICCTPCT 1.3   Coags: No results for input(s): "INR" in the last 72 hours.  Invalid input(s): "PT" Microbiology: No results found for this or any previous visit (from the past 240 hour(s)).  FURTHER DISCHARGE INSTRUCTIONS:  Get Medicines reviewed and adjusted: Please take all your medications with you for your next visit with your Primary MD  Laboratory/radiological data: Please request your Primary MD to go over all hospital tests and procedure/radiological results at the follow up, please ask your Primary MD to get all Hospital records sent to his/her office.  In some cases, they will be blood work, cultures and biopsy results pending at the time of your discharge. Please request that your primary care M.D. goes through all the records of your hospital data and follows up on these results.  Also Note the following: If you experience worsening of your admission symptoms, develop shortness of breath, life threatening emergency, suicidal or homicidal thoughts you must seek medical attention immediately by calling 911 or  calling your MD immediately  if symptoms less severe.  You must read complete instructions/literature along with  all the possible adverse reactions/side effects for all the Medicines you take and that have been prescribed to you. Take any new Medicines after you have completely understood and accpet all the possible adverse reactions/side effects.   Do not drive when taking Pain medications or sleeping medications (Benzodaizepines)  Do not take more than prescribed Pain, Sleep and Anxiety Medications. It is not advisable to combine anxiety,sleep and pain medications without talking with your primary care practitioner  Special Instructions: If you have smoked or chewed Tobacco  in the last 2 yrs please stop smoking, stop any regular Alcohol  and or any Recreational drug use.  Wear Seat belts while driving.  Please note: You were cared for by a hospitalist during your hospital stay. Once you are discharged, your primary care physician will handle any further medical issues. Please note that NO REFILLS for any discharge medications will be authorized once you are discharged, as it is imperative that you return to your primary care physician (or establish a relationship with a primary care physician if you do not have one) for your post hospital discharge needs so that they can reassess your need for medications and monitor your lab values.  Total Time spent coordinating discharge including counseling, education and face to face time equals greater than 30 minutes.  SignedJeoffrey Massed 01/10/2023 10:26 AM
